# Patient Record
Sex: Female | Born: 1965 | Hispanic: Yes | Marital: Married | Smoking: Never smoker
Health system: Southern US, Community
[De-identification: ages and names within clinical notes are randomized; demographics above are authoritative.]

## PROBLEM LIST (undated history)

## (undated) ENCOUNTER — Ambulatory Visit: Admission: EM | Payer: Medicaid Other

## (undated) DIAGNOSIS — E119 Type 2 diabetes mellitus without complications: Secondary | ICD-10-CM

## (undated) DIAGNOSIS — I10 Essential (primary) hypertension: Secondary | ICD-10-CM

## (undated) DIAGNOSIS — M329 Systemic lupus erythematosus, unspecified: Secondary | ICD-10-CM

## (undated) DIAGNOSIS — IMO0002 Reserved for concepts with insufficient information to code with codable children: Secondary | ICD-10-CM

## (undated) HISTORY — PX: ABDOMINAL HYSTERECTOMY: SHX81

---

## 2011-12-25 ENCOUNTER — Ambulatory Visit: Payer: Self-pay | Admitting: Rheumatology

## 2012-03-28 ENCOUNTER — Ambulatory Visit: Payer: Self-pay | Admitting: General Practice

## 2012-04-12 DIAGNOSIS — M5126 Other intervertebral disc displacement, lumbar region: Secondary | ICD-10-CM | POA: Insufficient documentation

## 2012-04-19 ENCOUNTER — Ambulatory Visit: Payer: Self-pay | Admitting: Internal Medicine

## 2012-07-07 ENCOUNTER — Ambulatory Visit: Payer: Self-pay | Admitting: Internal Medicine

## 2012-08-17 ENCOUNTER — Ambulatory Visit: Payer: Self-pay | Admitting: Internal Medicine

## 2012-09-23 ENCOUNTER — Emergency Department: Payer: Self-pay | Admitting: Emergency Medicine

## 2013-02-21 ENCOUNTER — Emergency Department: Payer: Self-pay | Admitting: Emergency Medicine

## 2013-03-21 ENCOUNTER — Emergency Department: Payer: Self-pay | Admitting: Emergency Medicine

## 2013-05-30 ENCOUNTER — Ambulatory Visit: Payer: Self-pay | Admitting: Nurse Practitioner

## 2013-10-07 IMAGING — US US PELV - US TRANSVAGINAL
1 series · 14 of 25 positions shown · non-contrast
Comparison: none

REASON FOR EXAM: 6 to 8 week repeat to eval ovarian cysts
COMMENTS:

[Series 1: us pelv - us transvaginal · 0.28mm/px · 14 of 64 slices shown]
[im 1/64]
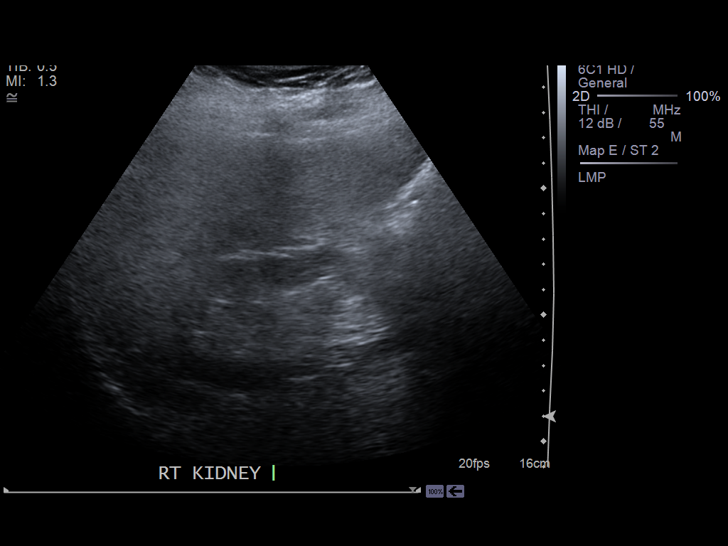
[im 6/64]
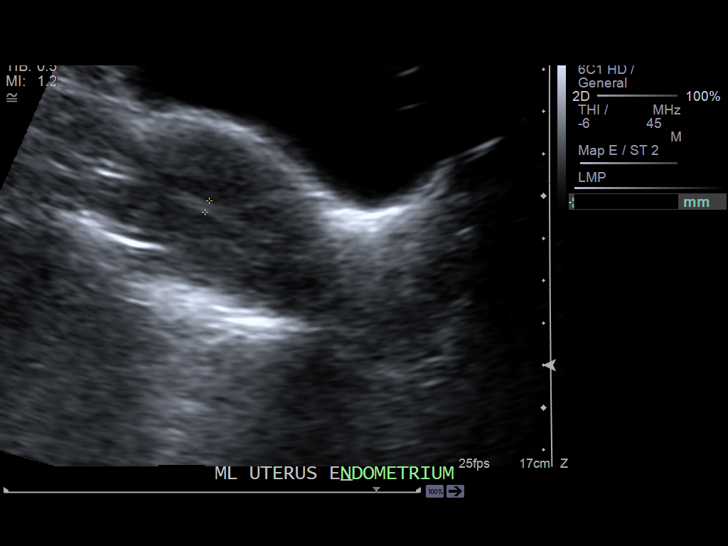
[im 11/64]
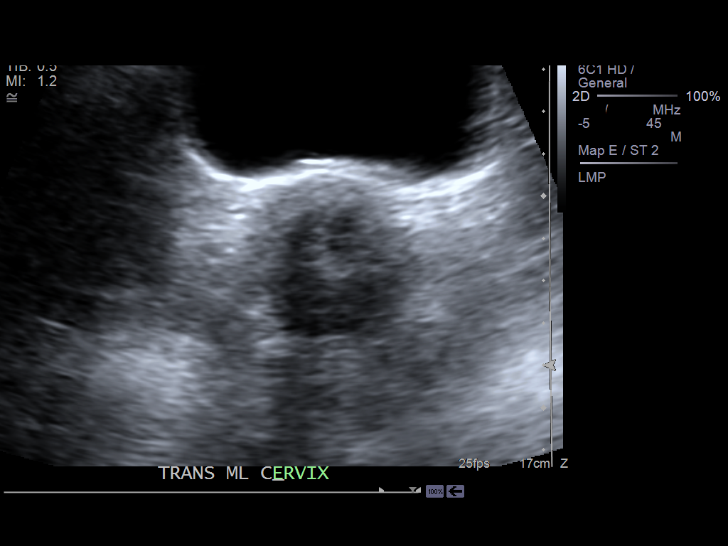
[im 16/64]
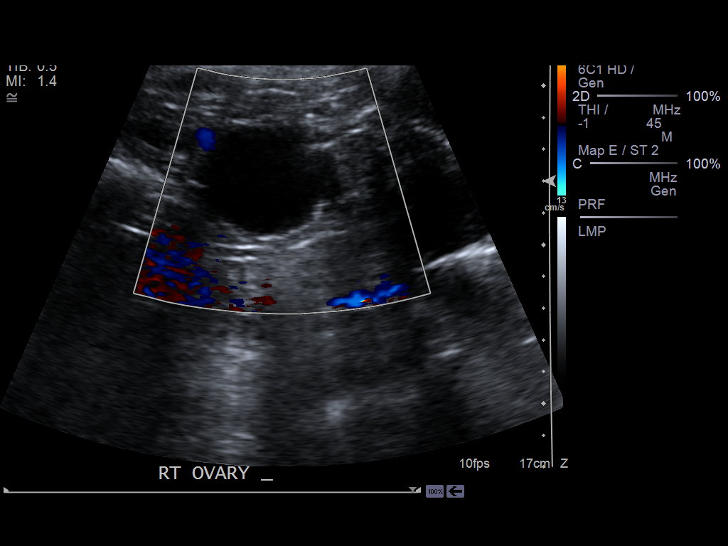
[im 22/64]
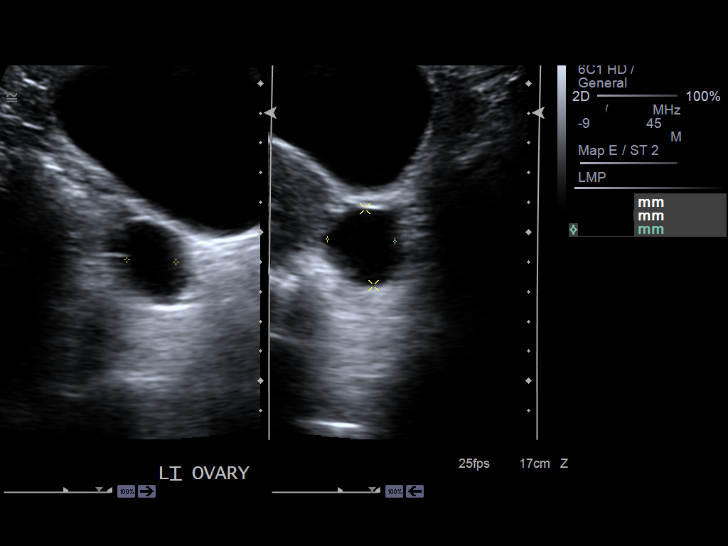
[im 24/64]
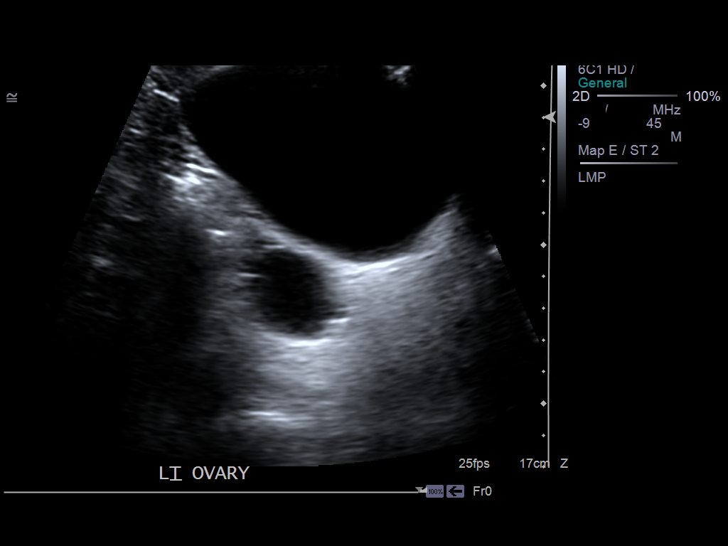
[im 29/64]
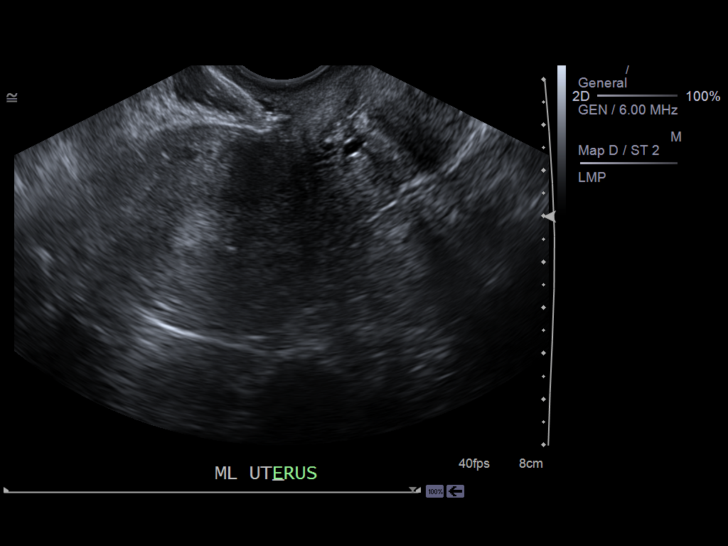
[im 35/64]
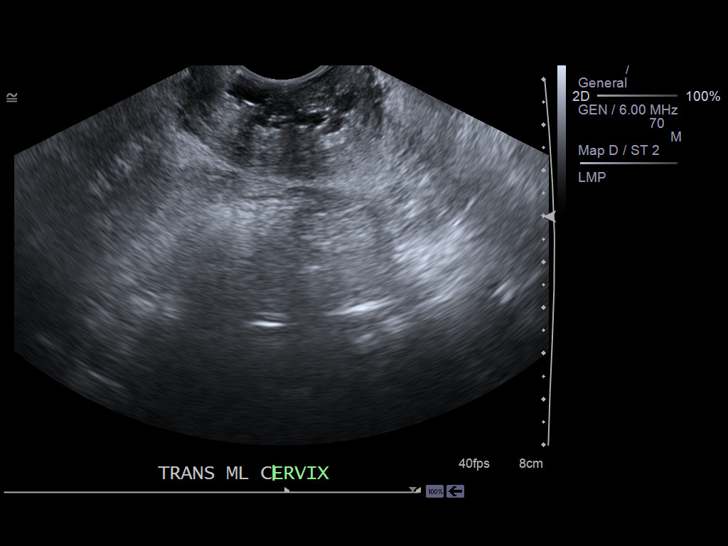
[im 40/64]
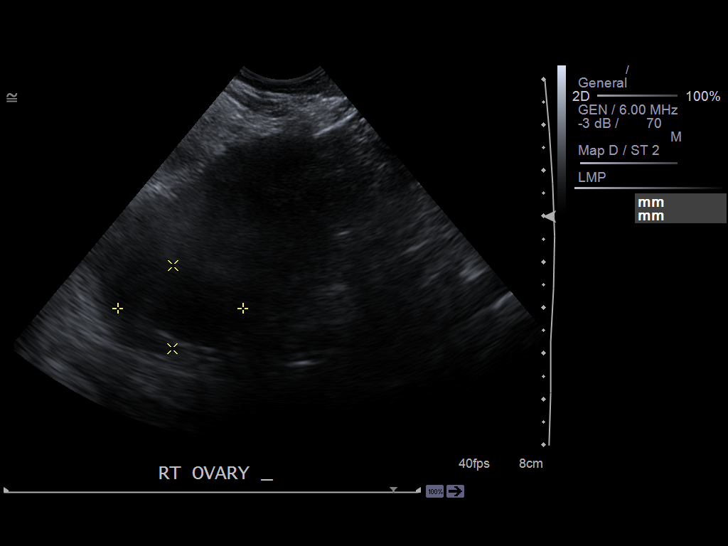
[im 43/64]
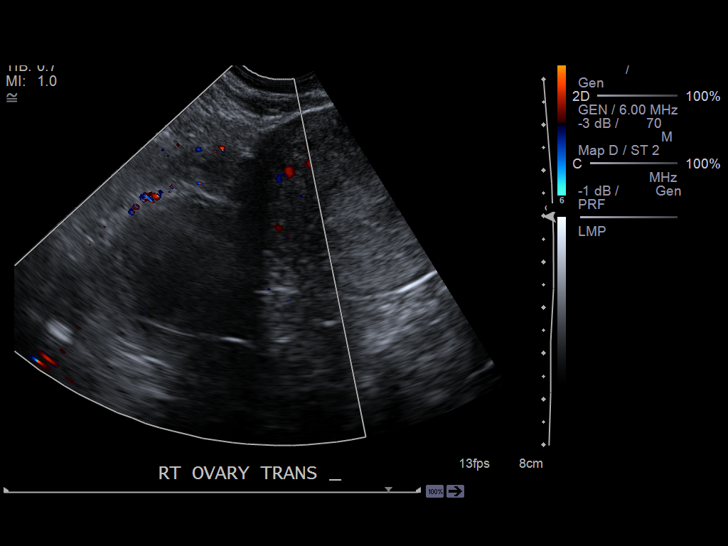
[im 48/64]
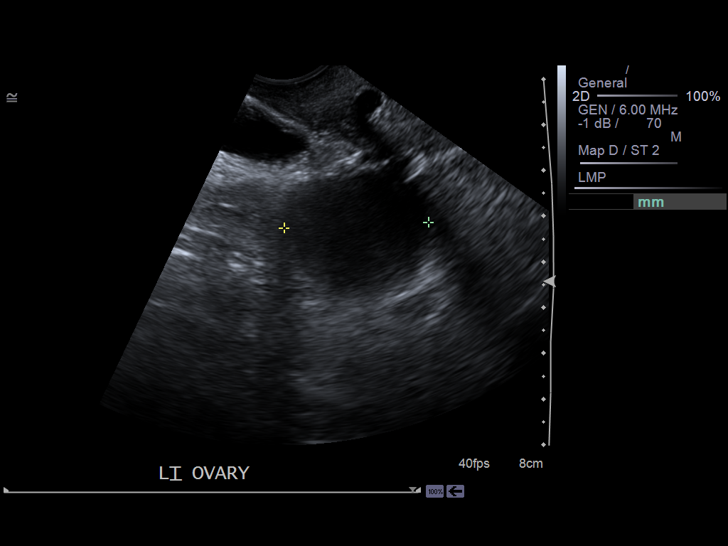
[im 53/64]
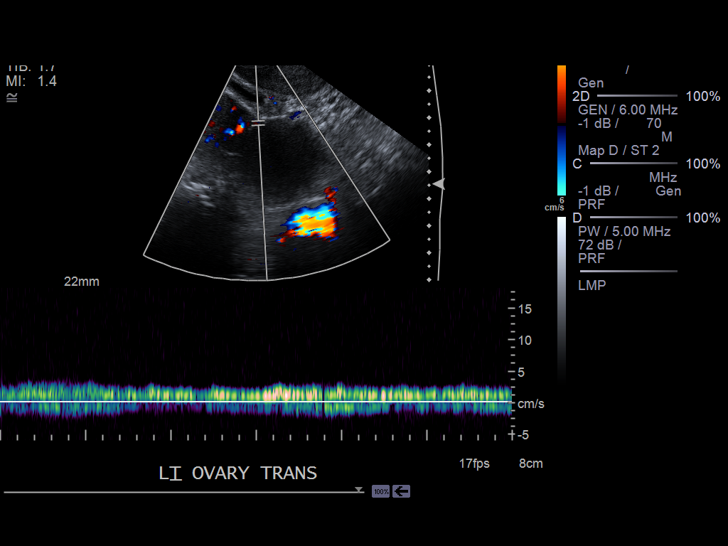
[im 58/64]
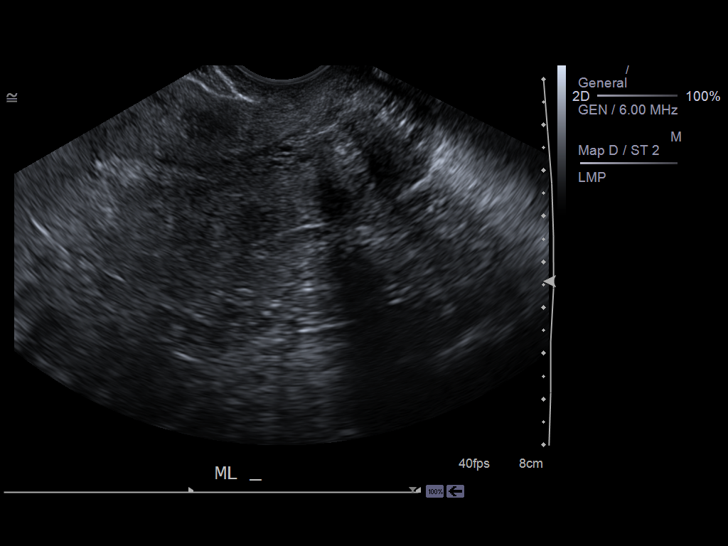
[im 64/64]
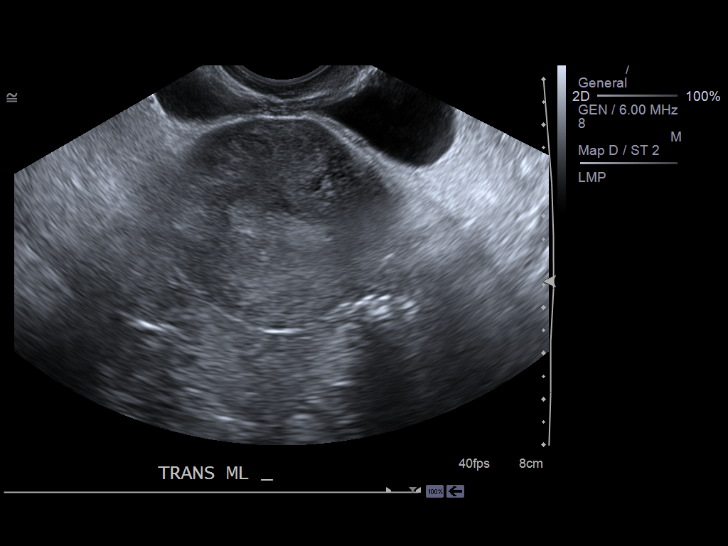

[14 of 25 positions shown; findings below may reference images not displayed]

PROCEDURE:     US  - US PELVIS EXAM W/TRANSVAGINAL  - August 17, 2012 [DATE]

RESULT:     Pelvic sonogram is performed. Transabdominal and endovaginal
imaging is performed. The kidneys appear grossly normal on survey images.
The uterus measures 8.98 x 5.24 x 3.62 cm. The right ovary measures 3.94 x
3.49 x 4.39 cm while the left is 2.97 x 2.83 x 2.99 cm. There are
well-circumscribed anechoic areas in each ovary. On the left this measures
2.93 x 2.51 x 2.33 cm. The right ovarian cystic collection measures 3.41 x
3.44 x 2.88 cm. There is no free fluid. Blood flow is documented in both
ovaries.
IMPRESSION: 1. Simple bilateral large ovarian cysts. Followup to document resolution in
6 to 8 weeks.

[REDACTED]

## 2014-01-09 ENCOUNTER — Ambulatory Visit: Payer: Self-pay | Admitting: Gastroenterology

## 2014-01-10 LAB — PATHOLOGY REPORT

## 2014-03-05 DIAGNOSIS — M329 Systemic lupus erythematosus, unspecified: Secondary | ICD-10-CM | POA: Insufficient documentation

## 2014-03-05 DIAGNOSIS — M549 Dorsalgia, unspecified: Secondary | ICD-10-CM | POA: Insufficient documentation

## 2014-03-08 ENCOUNTER — Ambulatory Visit: Payer: Self-pay | Admitting: Family Medicine

## 2014-04-16 ENCOUNTER — Ambulatory Visit: Payer: Self-pay | Admitting: Family Medicine

## 2014-04-24 ENCOUNTER — Ambulatory Visit: Payer: Self-pay | Admitting: Family Medicine

## 2016-08-04 ENCOUNTER — Other Ambulatory Visit: Payer: Self-pay | Admitting: Rheumatology

## 2016-08-04 DIAGNOSIS — M545 Low back pain, unspecified: Secondary | ICD-10-CM

## 2016-08-14 ENCOUNTER — Ambulatory Visit
Admission: RE | Admit: 2016-08-14 | Discharge: 2016-08-14 | Disposition: A | Discharge status: Home / Self Care | Payer: Medicaid Other | Source: Ambulatory Visit | Attending: Rheumatology | Admitting: Rheumatology

## 2016-08-14 ENCOUNTER — Encounter: Payer: Self-pay | Admitting: Radiology

## 2016-08-14 DIAGNOSIS — M48061 Spinal stenosis, lumbar region without neurogenic claudication: Secondary | ICD-10-CM | POA: Diagnosis not present

## 2016-08-14 DIAGNOSIS — M5137 Other intervertebral disc degeneration, lumbosacral region: Secondary | ICD-10-CM | POA: Diagnosis not present

## 2016-08-14 DIAGNOSIS — M5136 Other intervertebral disc degeneration, lumbar region: Secondary | ICD-10-CM | POA: Insufficient documentation

## 2016-08-14 DIAGNOSIS — M4316 Spondylolisthesis, lumbar region: Secondary | ICD-10-CM | POA: Insufficient documentation

## 2016-08-14 DIAGNOSIS — M545 Low back pain, unspecified: Secondary | ICD-10-CM

## 2017-02-13 ENCOUNTER — Emergency Department
Admission: EM | Admit: 2017-02-13 | Discharge: 2017-02-13 | Disposition: A | Discharge status: Home / Self Care | Payer: Medicaid Other | Attending: Emergency Medicine | Admitting: Emergency Medicine

## 2017-02-13 ENCOUNTER — Encounter: Payer: Self-pay | Admitting: *Deleted

## 2017-02-13 DIAGNOSIS — M5442 Lumbago with sciatica, left side: Secondary | ICD-10-CM | POA: Diagnosis not present

## 2017-02-13 DIAGNOSIS — M545 Low back pain: Secondary | ICD-10-CM | POA: Diagnosis present

## 2017-02-13 DIAGNOSIS — G8929 Other chronic pain: Secondary | ICD-10-CM

## 2017-02-13 MED ORDER — KETOROLAC TROMETHAMINE 60 MG/2ML IM SOLN
60.0000 mg | Freq: Once | INTRAMUSCULAR | Status: AC
  Administered 2017-02-13: 60 mg via INTRAMUSCULAR
  Filled 2017-02-13: qty 2

## 2017-02-13 MED ORDER — DICLOFENAC SODIUM 50 MG PO TBEC: 50.0000 mg | DELAYED_RELEASE_TABLET | Freq: Two times a day (BID) | ORAL | 1 refills | Status: DC

## 2017-02-13 MED ORDER — ORPHENADRINE CITRATE 30 MG/ML IJ SOLN
60.0000 mg | INTRAMUSCULAR | Status: AC
  Administered 2017-02-13: 60 mg via INTRAMUSCULAR
  Filled 2017-02-13: qty 2

## 2017-02-13 MED ORDER — CYCLOBENZAPRINE HCL 5 MG PO TABS: 5.0000 mg | ORAL_TABLET | Freq: Three times a day (TID) | ORAL | 0 refills | Status: DC | PRN

## 2017-02-13 NOTE — Discharge Instructions (Signed)
You are being treated for acute pain from your herniated disc and spinal stenosis. Keep your appointment with Dr. Yves Dill as scheduled. Take the prescription meds as directed. Return to the ED as needed.  Est siendo tratado por dolor agudo causado por su disco herniado y estenosis espinal. Mantenga su cita con el Dr. Yves Dill segn lo programado. Tome los medicamentos recetados segn las indicaciones. Regresa al ED segn sea necesario

## 2017-02-13 NOTE — ED Triage Notes (Signed)
Pt to ED reporting lower back pain x 3 days. Pt able to ambulate. No swelling in legs. No flank pain or changes in urine reported.

## 2017-02-13 NOTE — ED Provider Notes (Signed)
[facility] Emergency Department Provider Note ____________________________________________  Time seen: 1243  I have reviewed the triage vital signs and the nursing notes.  HISTORY  Chief Complaint  Back Pain  History limited by Spanish language. Interpreter Ciro Backer) present during interview and exam.  HPI Lindsay Dean is a 51 y.o. female presents to the ED for 3 day complaint of low back pain. Patient denies any flank pain, bowel or bladder changes, or saddle paresthesias. She has a history of chronic low back pain with confirmed L5-S1 spondylolisthesis and HNP, with spinal stenosis. She is in the care of Dr. Yves Dill, who recently performed 2 epidural steroid injections. He denies any recent injury, accident, trauma. She is scheduled to see Dr. Yves Dill for follow-up in 4 days.  History reviewed. No pertinent past medical history.  There are no active problems to display for this patient.  History reviewed. No pertinent surgical history.  Prior to Admission medications   Medication Sig Start Date End Date Taking? Authorizing Provider  cyclobenzaprine (FLEXERIL) 5 MG tablet Take 1 tablet (5 mg total) by mouth 3 (three) times daily as needed for muscle spasms. 02/13/17   Ellayna Hilligoss, Charlesetta Ivory, PA-C  diclofenac (VOLTAREN) 50 MG EC tablet Take 1 tablet (50 mg total) by mouth 2 (two) times daily. 02/13/17   Esiah Bazinet, Charlesetta Ivory, PA-C   Allergies Patient has no allergy information on record.  No family history on file.  Social History Social History  Substance Use Topics  . Smoking status: Never Smoker  . Smokeless tobacco: Never Used  . Alcohol use No    Review of Systems  Constitutional: Negative for fever. Cardiovascular: Negative for chest pain. Respiratory: Negative for shortness of breath. Gastrointestinal: Negative for abdominal pain, vomiting and diarrhea. Genitourinary: Negative for dysuria. Musculoskeletal: Positive for back  pain. Skin: Negative for rash. Neurological: Negative for headaches, focal weakness or numbness. ____________________________________________  PHYSICAL EXAM:  VITAL SIGNS: ED Triage Vitals [02/13/17 1130]  Enc Vitals Group     BP (!) 148/92     Pulse Rate 67     Resp 16     Temp 98 F (36.7 C)     Temp src      SpO2 (!) 10 %     Weight      Height      Head Circumference      Peak Flow      Pain Score      Pain Loc      Pain Edu?      Excl. in GC?     Constitutional: Alert and oriented. Well appearing and in no distress. Head: Normocephalic and atraumatic. Neck: Supple. No thyromegaly. Cardiovascular: Normal rate, regular rhythm. Normal distal pulses. Respiratory: Normal respiratory effort. No wheezes/rales/rhonchi. Gastrointestinal: Soft and nontender. No distention. Musculoskeletal: Normal spinal alignment without midline tenderness, spasm, deformity, or step-off. Negative supine SLR bilaterally. Nontender with normal range of motion in all extremities.  Neurologic: CN 2 through 12 grossly intact. Normal LE DTRs bilaterally. Normal gait without ataxia. Normal speech and language. No gross focal neurologic deficits are appreciated. Skin:  Skin is warm, dry and intact. No rash noted. ____________________________________________   RADIOLOGY  Lumbar MRI (07/2016)  IMPRESSION: 1. 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 Questionable superimposed right L4 more recent pars fracture. Grade 1 anterolisthesis of L4 on L5 has developed since 2013. Follow-up noncontrast Lumbar Spine CT (could be limited to  only the L3 through S1 levels) would be complementary to fully characterize these bony defects and assist in treatment strategy. 2. Associated disc and severe posterior element degeneration at L4-L5. No significant spinal stenosis but there is mild lateral recess stenosis and moderate to severe L4  foraminal stenosis worse on the right. 3. Moderate facet degeneration with mild degenerative appearing marrow edema also in the L5 posterior elements. Chronic and advanced L5-S1 disc and endplate degeneration. Leftward disc herniation has regressed but not resolved since 2013. Moderate left lateral recess and bilateral neural foraminal stenosis. ____________________________________________  PROCEDURES  Toradol 60 mg IM Norflex 60 mg IM ____________________________________________  INITIAL IMPRESSION / ASSESSMENT AND PLAN / ED COURSE  Patient with chronic back pain with underlying HNP, spondylolisthesis, and spinal stenosis. She has right lower extremity radicular symptoms on presentation. Her pain is not above baseline at this time. She'll be discharged with a prescription for Voltaren and Flexeril doses directed. She will follow with Dr. Yves Dill as scheduled on 8/29 for further evaluation and management. ____________________________________________  FINAL CLINICAL IMPRESSION(S) / ED DIAGNOSES  Final diagnoses:  Chronic bilateral low back pain with left-sided sciatica      Lissa Hoard, PA-C 02/13/17 1420    Myrna Blazer, MD 02/13/17 [phone]

## 2017-02-13 NOTE — ED Notes (Signed)
Per interpreter, Discussed discharge instructions, prescriptions, and follow-up care with patient. No questions or concerns at this time. Pt stable at discharge.

## 2018-04-04 ENCOUNTER — Other Ambulatory Visit: Payer: Self-pay

## 2018-04-04 DIAGNOSIS — Z1211 Encounter for screening for malignant neoplasm of colon: Secondary | ICD-10-CM

## 2018-05-13 ENCOUNTER — Emergency Department
Admission: EM | Admit: 2018-05-13 | Discharge: 2018-05-13 | Disposition: A | Discharge status: Home / Self Care | Payer: Medicaid Other | Attending: Student in an Organized Health Care Education/Training Program | Admitting: Student in an Organized Health Care Education/Training Program

## 2018-05-13 ENCOUNTER — Other Ambulatory Visit: Payer: Self-pay

## 2018-05-13 ENCOUNTER — Emergency Department: Payer: Medicaid Other

## 2018-05-13 ENCOUNTER — Encounter: Payer: Self-pay | Admitting: Emergency Medicine

## 2018-05-13 DIAGNOSIS — I10 Essential (primary) hypertension: Secondary | ICD-10-CM | POA: Insufficient documentation

## 2018-05-13 DIAGNOSIS — Z79899 Other long term (current) drug therapy: Secondary | ICD-10-CM | POA: Diagnosis not present

## 2018-05-13 DIAGNOSIS — R1013 Epigastric pain: Secondary | ICD-10-CM | POA: Insufficient documentation

## 2018-05-13 DIAGNOSIS — E119 Type 2 diabetes mellitus without complications: Secondary | ICD-10-CM | POA: Insufficient documentation

## 2018-05-13 HISTORY — DX: Systemic lupus erythematosus, unspecified: M32.9

## 2018-05-13 HISTORY — DX: Type 2 diabetes mellitus without complications: E11.9

## 2018-05-13 HISTORY — DX: Essential (primary) hypertension: I10

## 2018-05-13 HISTORY — DX: Reserved for concepts with insufficient information to code with codable children: IMO0002

## 2018-05-13 LAB — URINALYSIS, COMPLETE (UACMP) WITH MICROSCOPIC
Bilirubin Urine: NEGATIVE
GLUCOSE, UA: NEGATIVE mg/dL
Hgb urine dipstick: NEGATIVE
KETONES UR: NEGATIVE mg/dL
Leukocytes, UA: NEGATIVE
NITRITE: NEGATIVE
PH: 5 (ref 5.0–8.0)
PROTEIN: NEGATIVE mg/dL
SPECIFIC GRAVITY, URINE: 1.018 (ref 1.005–1.030)
SQUAMOUS EPITHELIAL / LPF: NONE SEEN (ref 0–5)

## 2018-05-13 LAB — COMPREHENSIVE METABOLIC PANEL
ALK PHOS: 65 U/L (ref 38–126)
ALT: 15 U/L (ref 0–44)
AST: 24 U/L (ref 15–41)
Albumin: 3.7 g/dL (ref 3.5–5.0)
Anion gap: 8 (ref 5–15)
BUN: 14 mg/dL (ref 6–20)
CHLORIDE: 105 mmol/L (ref 98–111)
CO2: 25 mmol/L (ref 22–32)
CREATININE: 0.82 mg/dL (ref 0.44–1.00)
Calcium: 8.9 mg/dL (ref 8.9–10.3)
Glucose, Bld: 151 mg/dL — ABNORMAL HIGH (ref 70–99)
Potassium: 3.5 mmol/L (ref 3.5–5.1)
Sodium: 138 mmol/L (ref 135–145)
Total Bilirubin: 0.7 mg/dL (ref 0.3–1.2)
Total Protein: 8.2 g/dL — ABNORMAL HIGH (ref 6.5–8.1)

## 2018-05-13 LAB — CBC
HEMATOCRIT: 36.5 % (ref 36.0–46.0)
Hemoglobin: 12.3 g/dL (ref 12.0–15.0)
MCH: 30.9 pg (ref 26.0–34.0)
MCHC: 33.7 g/dL (ref 30.0–36.0)
MCV: 91.7 fL (ref 80.0–100.0)
Platelets: 184 10*3/uL (ref 150–400)
RBC: 3.98 MIL/uL (ref 3.87–5.11)
RDW: 12.5 % (ref 11.5–15.5)
WBC: 5.6 10*3/uL (ref 4.0–10.5)
nRBC: 0 % (ref 0.0–0.2)

## 2018-05-13 LAB — LIPASE, BLOOD: LIPASE: 34 U/L (ref 11–51)

## 2018-05-13 LAB — TROPONIN I: Troponin I: <0.03 ng/mL (ref <0.03)

## 2018-05-13 MED ORDER — PROMETHAZINE HCL 12.5 MG PO TABS: 12.5000 mg | ORAL_TABLET | Freq: Four times a day (QID) | ORAL | 0 refills | Status: DC | PRN

## 2018-05-13 MED ORDER — SODIUM CHLORIDE 0.9 % IV BOLUS
500.0000 mL | Freq: Once | INTRAVENOUS | Status: AC
  Administered 2018-05-13: 500 mL via INTRAVENOUS

## 2018-05-13 MED ORDER — IOPAMIDOL (ISOVUE-300) INJECTION 61%
100.0000 mL | Freq: Once | INTRAVENOUS | Status: AC | PRN
  Administered 2018-05-13: 100 mL via INTRAVENOUS

## 2018-05-13 MED ORDER — PROMETHAZINE HCL 25 MG/ML IJ SOLN
12.5000 mg | Freq: Four times a day (QID) | INTRAMUSCULAR | Status: DC | PRN
  Administered 2018-05-13: 12.5 mg via INTRAVENOUS
  Filled 2018-05-13: qty 1

## 2018-05-13 MED ORDER — MORPHINE SULFATE (PF) 4 MG/ML IV SOLN
4.0000 mg | INTRAVENOUS | Status: DC | PRN
  Administered 2018-05-13: 4 mg via INTRAVENOUS
  Filled 2018-05-13: qty 1

## 2018-05-13 MED ORDER — SUCRALFATE 1 G PO TABS
1.0000 g | ORAL_TABLET | Freq: Once | ORAL | Status: AC
  Administered 2018-05-13: 1 g via ORAL
  Filled 2018-05-13: qty 1

## 2018-05-13 NOTE — ED Provider Notes (Signed)
[facility] Emergency Department Provider Note    First MD Initiated Contact with Patient 05/13/18 [phone]     (approximate)  I have reviewed the triage vital signs and the nursing notes.   HISTORY  Chief Complaint Abdominal Pain; Emesis; and Dysuria    HPI Lindsay Dean is a 52 y.o. female history of lupus as well as hypertension and diabetes presents the ER with generalized epigastric pain associated with nausea vomiting.  Pain does radiate through to her back.  Denies any fevers.  She is status post cholecystectomy.  Denies any chest pain or shortness of breath.  States the pain is mild to moderate in severity.    Past Medical History:  Diagnosis Date  . Diabetes mellitus without complication (HCC)   . Hypertension   . Lupus (HCC)    No family history on file. Past Surgical History:  Procedure Laterality Date  . ABDOMINAL HYSTERECTOMY     There are no active problems to display for this patient.     Prior to Admission medications   Medication Sig Start Date End Date Taking? Authorizing Provider  cyclobenzaprine (FLEXERIL) 5 MG tablet Take 1 tablet (5 mg total) by mouth 3 (three) times daily as needed for muscle spasms. 02/13/17   Menshew, Charlesetta Ivory, PA-C  diclofenac (VOLTAREN) 50 MG EC tablet Take 1 tablet (50 mg total) by mouth 2 (two) times daily. 02/13/17   Menshew, Charlesetta Ivory, PA-C  promethazine (PHENERGAN) 12.5 MG tablet Take 1 tablet (12.5 mg total) by mouth every 6 (six) hours as needed for nausea or vomiting. 05/13/18   Willy Eddy, MD    Allergies Patient has no known allergies.    Social History Social History   Tobacco Use  . Smoking status: Never Smoker  . Smokeless tobacco: Never Used  Substance Use Topics  . Alcohol use: No  . Drug use: No    Review of Systems Patient denies headaches, rhinorrhea, blurry vision, numbness, shortness of breath, chest pain, edema, cough, abdominal pain, nausea,  vomiting, diarrhea, dysuria, fevers, rashes or hallucinations unless otherwise stated above in HPI. ____________________________________________   PHYSICAL EXAM:  VITAL SIGNS: Vitals:   05/13/18 0741 05/13/18 0837  BP: (!) 156/93 (!) 143/92  Pulse: 73 64  Resp: 14 16  Temp:    SpO2: 94% 94%    Constitutional: Alert and oriented.  Eyes: Conjunctivae are normal.  Head: Atraumatic. Nose: No congestion/rhinnorhea. Mouth/Throat: Mucous membranes are moist.   Neck: No stridor. Painless ROM.  Cardiovascular: Normal rate, regular rhythm. Grossly normal heart sounds.  Good peripheral circulation. Respiratory: Normal respiratory effort.  No retractions. Lungs CTAB. Gastrointestinal: Soft with mild epigastric ttp. No distention. No abdominal bruits. No CVA tenderness. Genitourinary:  Musculoskeletal: No lower extremity tenderness nor edema.  No joint effusions. Neurologic:  Normal speech and language. No gross focal neurologic deficits are appreciated. No facial droop Skin:  Skin is warm, dry and intact. No rash noted. Psychiatric: Mood and affect are normal. Speech and behavior are normal.  ____________________________________________   LABS (all labs ordered are listed, but only abnormal results are displayed)  Results for orders placed or performed during the hospital encounter of 05/13/18 (from the past 24 hour(s))  Lipase, blood     Status: None   Collection Time: 05/13/18  5:28 AM  Result Value Ref Range   Lipase 34 11 - 51 U/L  Comprehensive metabolic panel     Status: Abnormal   Collection Time: 05/13/18  5:28 AM  Result Value Ref Range   Sodium 138 135 - 145 mmol/L   Potassium 3.5 3.5 - 5.1 mmol/L   Chloride 105 98 - 111 mmol/L   CO2 25 22 - 32 mmol/L   Glucose, Bld 151 (H) 70 - 99 mg/dL   BUN 14 6 - 20 mg/dL   Creatinine, Ser 1.610.82 0.44 - 1.00 mg/dL   Calcium 8.9 8.9 - 09.610.3 mg/dL   Total Protein 8.2 (H) 6.5 - 8.1 g/dL   Albumin 3.7 3.5 - 5.0 g/dL   AST 24 15 - 41  U/L   ALT 15 0 - 44 U/L   Alkaline Phosphatase 65 38 - 126 U/L   Total Bilirubin 0.7 0.3 - 1.2 mg/dL   GFR calc non Af Amer >60 >60 mL/min   GFR calc Af Amer >60 >60 mL/min   Anion gap 8 5 - 15  CBC     Status: None   Collection Time: 05/13/18  5:28 AM  Result Value Ref Range   WBC 5.6 4.0 - 10.5 K/uL   RBC 3.98 3.87 - 5.11 MIL/uL   Hemoglobin 12.3 12.0 - 15.0 g/dL   HCT 04.536.5 40.936.0 - 81.146.0 %   MCV 91.7 80.0 - 100.0 fL   MCH 30.9 26.0 - 34.0 pg   MCHC 33.7 30.0 - 36.0 g/dL   RDW 91.412.5 78.211.5 - 95.615.5 %   Platelets 184 150 - 400 K/uL   nRBC 0.0 0.0 - 0.2 %  Troponin I - Add-On to previous collection     Status: None   Collection Time: 05/13/18  5:28 AM  Result Value Ref Range   Troponin I <0.03 <0.03 ng/mL  Urinalysis, Complete w Microscopic     Status: Abnormal   Collection Time: 05/13/18  5:36 AM  Result Value Ref Range   Color, Urine YELLOW (A) YELLOW   APPearance CLEAR (A) CLEAR   Specific Gravity, Urine 1.018 1.005 - 1.030   pH 5.0 5.0 - 8.0   Glucose, UA NEGATIVE NEGATIVE mg/dL   Hgb urine dipstick NEGATIVE NEGATIVE   Bilirubin Urine NEGATIVE NEGATIVE   Ketones, ur NEGATIVE NEGATIVE mg/dL   Protein, ur NEGATIVE NEGATIVE mg/dL   Nitrite NEGATIVE NEGATIVE   Leukocytes, UA NEGATIVE NEGATIVE   RBC / HPF 0-5 0 - 5 RBC/hpf   WBC, UA 0-5 0 - 5 WBC/hpf   Bacteria, UA RARE (A) NONE SEEN   Squamous Epithelial / LPF NONE SEEN 0 - 5   Mucus PRESENT   Urinalysis, Complete w Microscopic     Status: Abnormal   Collection Time: 05/13/18  6:12 AM  Result Value Ref Range   Color, Urine YELLOW (A) YELLOW   APPearance CLEAR (A) CLEAR   Specific Gravity, Urine 1.018 1.005 - 1.030   pH 6.0 5.0 - 8.0   Glucose, UA NEGATIVE NEGATIVE mg/dL   Hgb urine dipstick NEGATIVE NEGATIVE   Bilirubin Urine NEGATIVE NEGATIVE   Ketones, ur NEGATIVE NEGATIVE mg/dL   Protein, ur 30 (A) NEGATIVE mg/dL   Nitrite NEGATIVE NEGATIVE   Leukocytes, UA NEGATIVE NEGATIVE   RBC / HPF 0-5 0 - 5 RBC/hpf   WBC, UA  0-5 0 - 5 WBC/hpf   Bacteria, UA NONE SEEN NONE SEEN   Squamous Epithelial / LPF 0-5 0 - 5   Mucus PRESENT    ____________________________________________  EKG My review and personal interpretation at Time: 9:54   Indication: epigastric pain  Rate: 60  Rhythm: sinus Axis: normal Other: borderline  prolonged qt, no stemi, nonspecific t wave abn ____________________________________________  RADIOLOGY  I personally reviewed all radiographic images ordered to evaluate for the above acute complaints and reviewed radiology reports and findings.  These findings were personally discussed with the patient.  Please see medical record for radiology report.  ____________________________________________   PROCEDURES  Procedure(s) performed:  Procedures    Critical Care performed: no ____________________________________________   INITIAL IMPRESSION / ASSESSMENT AND PLAN / ED COURSE  Pertinent labs & imaging results that were available during my care of the patient were reviewed by me and considered in my medical decision making (see chart for details).   DDX: pancreatitis, enteritis, gastritis, mass, sbo  Lindsay Dean is a 52 y.o. who presents to the ED with some Tums as described above.  Patient nontoxic-appearing but does appear uncomfortable.  Blood work thereafter thus far is reassuring.  No evidence of UTI.  Based on her age previous surgical history and symptoms will order CT imaging to evaluate for acute intra-abdominal process.  Clinical Course as of May 13 1005  Fri May 13, 2018  1005 CT imaging shows no acute intra-abdominal process.  No evidence of obstructive pattern.  EKG shows no evidence of ischemia.  Troponin negative.  Patient tolerating oral hydration.  Most clinically consistent with acute gastritis.  Patient stable for trial of outpatient observation.  Have discussed with the patient and available family all diagnostics and treatments performed thus far and all  questions were answered to the best of my ability. The patient demonstrates understanding and agreement with plan.    [PR]    Clinical Course User Index [PR] Willy Eddy, MD     As part of my medical decision making, I reviewed the following data within the electronic MEDICAL RECORD NUMBER Nursing notes reviewed and incorporated, Labs reviewed, notes from prior ED visits.   ____________________________________________   FINAL CLINICAL IMPRESSION(S) / ED DIAGNOSES  Final diagnoses:  Acute epigastric pain      NEW MEDICATIONS STARTED DURING THIS VISIT:  New Prescriptions   PROMETHAZINE (PHENERGAN) 12.5 MG TABLET    Take 1 tablet (12.5 mg total) by mouth every 6 (six) hours as needed for nausea or vomiting.     Note:  This document was prepared using Dragon voice recognition software and may include unintentional dictation errors.    Willy Eddy, MD 05/13/18 1006

## 2018-05-13 NOTE — ED Triage Notes (Signed)
Patient with complaint of generalized abdominal pain, constipation, vomiting and pain with urination times 2 days.

## 2018-05-23 ENCOUNTER — Ambulatory Visit: Payer: Medicaid Other | Admitting: Anesthesiology

## 2018-05-23 ENCOUNTER — Encounter
Admission: RE | Disposition: A | Discharge status: Home / Self Care | Payer: Self-pay | Source: Ambulatory Visit | Attending: Gastroenterology

## 2018-05-23 ENCOUNTER — Encounter: Payer: Self-pay | Admitting: *Deleted

## 2018-05-23 ENCOUNTER — Ambulatory Visit
Admission: RE | Admit: 2018-05-23 | Discharge: 2018-05-23 | Disposition: A | Discharge status: Home / Self Care | Payer: Medicaid Other | Source: Ambulatory Visit | Attending: Gastroenterology | Admitting: Gastroenterology

## 2018-05-23 DIAGNOSIS — Z5309 Procedure and treatment not carried out because of other contraindication: Secondary | ICD-10-CM | POA: Insufficient documentation

## 2018-05-23 DIAGNOSIS — I10 Essential (primary) hypertension: Secondary | ICD-10-CM | POA: Diagnosis not present

## 2018-05-23 DIAGNOSIS — Z1211 Encounter for screening for malignant neoplasm of colon: Secondary | ICD-10-CM

## 2018-05-23 DIAGNOSIS — M329 Systemic lupus erythematosus, unspecified: Secondary | ICD-10-CM | POA: Diagnosis not present

## 2018-05-23 DIAGNOSIS — E119 Type 2 diabetes mellitus without complications: Secondary | ICD-10-CM | POA: Diagnosis not present

## 2018-05-23 SURGERY — COLONOSCOPY WITH PROPOFOL
Anesthesia: General

## 2018-05-23 MED ORDER — SODIUM CHLORIDE 0.9 % IV SOLN: INTRAVENOUS | Status: DC

## 2018-05-23 MED ORDER — PROPOFOL 10 MG/ML IV BOLUS
INTRAVENOUS | Status: AC
  Filled 2018-05-23: qty 20

## 2018-05-23 NOTE — Anesthesia Preprocedure Evaluation (Signed)
Anesthesia Evaluation  Patient identified by MRN, date of birth, ID band Patient awake    Reviewed: Allergy & Precautions, H&P , NPO status , Patient's Chart, lab work & pertinent test results  Airway        Dental   Pulmonary neg pulmonary ROS,           Cardiovascular hypertension,      Neuro/Psych negative neurological ROS  negative psych ROS   GI/Hepatic negative GI ROS, Neg liver ROS,   Endo/Other  diabetes  Renal/GU negative Renal ROS  negative genitourinary   Musculoskeletal   Abdominal   Peds  Hematology negative hematology ROS (+)   Anesthesia Other Findings Past Medical History: No date: Diabetes mellitus without complication (HCC)     Comment:  taken off meds in 2017 No date: Hypertension No date: Lupus (HCC)  Past Surgical History: No date: ABDOMINAL HYSTERECTOMY  BMI    Body Mass Index:  31.38 kg/m      Reproductive/Obstetrics negative OB ROS                             Anesthesia Physical Anesthesia Plan  ASA: II  Anesthesia Plan: General   Post-op Pain Management:    Induction:   PONV Risk Score and Plan: Propofol infusion and TIVA  Airway Management Planned: Natural Airway and Nasal Cannula  Additional Equipment:   Intra-op Plan:   Post-operative Plan:   Informed Consent: I have reviewed the patients History and Physical, chart, labs and discussed the procedure including the risks, benefits and alternatives for the proposed anesthesia with the patient or authorized representative who has indicated his/her understanding and acceptance.   Dental Advisory Given  Plan Discussed with: Anesthesiologist  Anesthesia Plan Comments:         Anesthesia Quick Evaluation

## 2018-05-23 NOTE — Progress Notes (Signed)
Cancelled due to patient ate two meals on the day before the procedure

## 2018-05-27 DIAGNOSIS — N812 Incomplete uterovaginal prolapse: Secondary | ICD-10-CM | POA: Insufficient documentation

## 2018-05-27 DIAGNOSIS — N816 Rectocele: Secondary | ICD-10-CM | POA: Insufficient documentation

## 2018-05-27 DIAGNOSIS — M4310 Spondylolisthesis, site unspecified: Secondary | ICD-10-CM | POA: Insufficient documentation

## 2018-05-27 DIAGNOSIS — N8110 Cystocele, unspecified: Secondary | ICD-10-CM | POA: Insufficient documentation

## 2018-05-27 DIAGNOSIS — N393 Stress incontinence (female) (male): Secondary | ICD-10-CM | POA: Insufficient documentation

## 2018-05-27 DIAGNOSIS — G4730 Sleep apnea, unspecified: Secondary | ICD-10-CM | POA: Insufficient documentation

## 2018-12-30 ENCOUNTER — Other Ambulatory Visit: Payer: Self-pay | Admitting: Family Medicine

## 2018-12-30 DIAGNOSIS — Z1231 Encounter for screening mammogram for malignant neoplasm of breast: Secondary | ICD-10-CM

## 2019-05-04 ENCOUNTER — Inpatient Hospital Stay: Payer: Medicaid Other

## 2019-05-04 ENCOUNTER — Encounter: Payer: Self-pay | Admitting: Emergency Medicine

## 2019-05-04 ENCOUNTER — Other Ambulatory Visit: Payer: Self-pay

## 2019-05-04 ENCOUNTER — Inpatient Hospital Stay: Payer: Medicaid Other | Admitting: Anesthesiology

## 2019-05-04 ENCOUNTER — Inpatient Hospital Stay
Admission: EM | Admit: 2019-05-04 | Discharge: 2019-05-09 | DRG: 872 | Disposition: A | Discharge status: Home Health | Payer: Medicaid Other | Attending: Internal Medicine | Admitting: Internal Medicine

## 2019-05-04 ENCOUNTER — Emergency Department: Payer: Medicaid Other

## 2019-05-04 ENCOUNTER — Encounter
Admission: EM | Disposition: A | Discharge status: Home Health | Payer: Self-pay | Source: Home / Self Care | Attending: Internal Medicine

## 2019-05-04 DIAGNOSIS — I10 Essential (primary) hypertension: Secondary | ICD-10-CM | POA: Diagnosis present

## 2019-05-04 DIAGNOSIS — N136 Pyonephrosis: Secondary | ICD-10-CM | POA: Diagnosis present

## 2019-05-04 DIAGNOSIS — Z90710 Acquired absence of both cervix and uterus: Secondary | ICD-10-CM

## 2019-05-04 DIAGNOSIS — Z20828 Contact with and (suspected) exposure to other viral communicable diseases: Secondary | ICD-10-CM | POA: Diagnosis present

## 2019-05-04 DIAGNOSIS — A419 Sepsis, unspecified organism: Secondary | ICD-10-CM

## 2019-05-04 DIAGNOSIS — A4151 Sepsis due to Escherichia coli [E. coli]: Secondary | ICD-10-CM | POA: Diagnosis present

## 2019-05-04 DIAGNOSIS — N139 Obstructive and reflux uropathy, unspecified: Secondary | ICD-10-CM

## 2019-05-04 DIAGNOSIS — Z791 Long term (current) use of non-steroidal anti-inflammatories (NSAID): Secondary | ICD-10-CM | POA: Diagnosis not present

## 2019-05-04 DIAGNOSIS — Z9049 Acquired absence of other specified parts of digestive tract: Secondary | ICD-10-CM

## 2019-05-04 DIAGNOSIS — M329 Systemic lupus erythematosus, unspecified: Secondary | ICD-10-CM | POA: Diagnosis present

## 2019-05-04 DIAGNOSIS — N1330 Unspecified hydronephrosis: Secondary | ICD-10-CM

## 2019-05-04 DIAGNOSIS — Z1611 Resistance to penicillins: Secondary | ICD-10-CM | POA: Diagnosis present

## 2019-05-04 DIAGNOSIS — E860 Dehydration: Secondary | ICD-10-CM | POA: Diagnosis present

## 2019-05-04 DIAGNOSIS — N1339 Other hydronephrosis: Secondary | ICD-10-CM

## 2019-05-04 DIAGNOSIS — D649 Anemia, unspecified: Secondary | ICD-10-CM | POA: Diagnosis present

## 2019-05-04 DIAGNOSIS — N3281 Overactive bladder: Secondary | ICD-10-CM | POA: Diagnosis present

## 2019-05-04 DIAGNOSIS — E119 Type 2 diabetes mellitus without complications: Secondary | ICD-10-CM | POA: Diagnosis present

## 2019-05-04 DIAGNOSIS — E785 Hyperlipidemia, unspecified: Secondary | ICD-10-CM | POA: Diagnosis present

## 2019-05-04 DIAGNOSIS — N179 Acute kidney failure, unspecified: Secondary | ICD-10-CM | POA: Diagnosis present

## 2019-05-04 DIAGNOSIS — R509 Fever, unspecified: Secondary | ICD-10-CM

## 2019-05-04 DIAGNOSIS — N390 Urinary tract infection, site not specified: Secondary | ICD-10-CM

## 2019-05-04 DIAGNOSIS — E872 Acidosis: Secondary | ICD-10-CM | POA: Diagnosis present

## 2019-05-04 DIAGNOSIS — Z79899 Other long term (current) drug therapy: Secondary | ICD-10-CM | POA: Diagnosis not present

## 2019-05-04 HISTORY — PX: CYSTOSCOPY WITH STENT PLACEMENT: SHX5790

## 2019-05-04 LAB — CBC WITH DIFFERENTIAL/PLATELET
Abs Immature Granulocytes: 0.03 10*3/uL (ref 0.00–0.07)
Basophils Absolute: 0 10*3/uL (ref 0.0–0.1)
Basophils Relative: 0 %
Eosinophils Absolute: 0 10*3/uL (ref 0.0–0.5)
Eosinophils Relative: 0 %
HCT: 35.4 % — ABNORMAL LOW (ref 36.0–46.0)
Hemoglobin: 11.5 g/dL — ABNORMAL LOW (ref 12.0–15.0)
Immature Granulocytes: 0 %
Lymphocytes Relative: 7 %
Lymphs Abs: 0.7 10*3/uL (ref 0.7–4.0)
MCH: 28.8 pg (ref 26.0–34.0)
MCHC: 32.5 g/dL (ref 30.0–36.0)
MCV: 88.7 fL (ref 80.0–100.0)
Monocytes Absolute: 0.6 10*3/uL (ref 0.1–1.0)
Monocytes Relative: 6 %
Neutro Abs: 8.1 10*3/uL — ABNORMAL HIGH (ref 1.7–7.7)
Neutrophils Relative %: 87 %
Platelets: 213 10*3/uL (ref 150–400)
RBC: 3.99 MIL/uL (ref 3.87–5.11)
RDW: 13.6 % (ref 11.5–15.5)
WBC: 9.5 10*3/uL (ref 4.0–10.5)
nRBC: 0 % (ref 0.0–0.2)

## 2019-05-04 LAB — URINALYSIS, COMPLETE (UACMP) WITH MICROSCOPIC
Bilirubin Urine: NEGATIVE
Glucose, UA: NEGATIVE mg/dL
Ketones, ur: NEGATIVE mg/dL
Nitrite: POSITIVE — AB
Protein, ur: 30 mg/dL — AB
Specific Gravity, Urine: 1.016 (ref 1.005–1.030)
WBC, UA: >50 WBC/hpf — ABNORMAL HIGH (ref 0–5)
pH: 5 (ref 5.0–8.0)

## 2019-05-04 LAB — LACTIC ACID, PLASMA
Lactic Acid, Venous: 0.9 mmol/L (ref 0.5–1.9)
Lactic Acid, Venous: 0.9 mmol/L (ref 0.5–1.9)

## 2019-05-04 LAB — COMPREHENSIVE METABOLIC PANEL
ALT: 19 U/L (ref 0–44)
AST: 25 U/L (ref 15–41)
Albumin: 3.7 g/dL (ref 3.5–5.0)
Alkaline Phosphatase: 97 U/L (ref 38–126)
Anion gap: 11 (ref 5–15)
BUN: 27 mg/dL — ABNORMAL HIGH (ref 6–20)
CO2: 19 mmol/L — ABNORMAL LOW (ref 22–32)
Calcium: 9.4 mg/dL (ref 8.9–10.3)
Chloride: 108 mmol/L (ref 98–111)
Creatinine, Ser: 1.12 mg/dL — ABNORMAL HIGH (ref 0.44–1.00)
GFR calc Af Amer: >60 mL/min (ref >60)
GFR calc non Af Amer: 57 mL/min — ABNORMAL LOW (ref >60)
Glucose, Bld: 100 mg/dL — ABNORMAL HIGH (ref 70–99)
Potassium: 3.5 mmol/L (ref 3.5–5.1)
Sodium: 138 mmol/L (ref 135–145)
Total Bilirubin: 0.5 mg/dL (ref 0.3–1.2)
Total Protein: 9.2 g/dL — ABNORMAL HIGH (ref 6.5–8.1)

## 2019-05-04 LAB — GLUCOSE, CAPILLARY: Glucose-Capillary: 87 mg/dL (ref 70–99)

## 2019-05-04 LAB — PROTIME-INR
INR: 1.1 (ref 0.8–1.2)
Prothrombin Time: 13.8 seconds (ref 11.4–15.2)

## 2019-05-04 LAB — APTT: aPTT: 28 seconds (ref 24–36)

## 2019-05-04 LAB — SARS CORONAVIRUS 2 BY RT PCR (HOSPITAL ORDER, PERFORMED IN ~~LOC~~ HOSPITAL LAB): SARS Coronavirus 2: NEGATIVE

## 2019-05-04 SURGERY — CYSTOSCOPY, WITH STENT INSERTION
Anesthesia: General | Laterality: Right

## 2019-05-04 MED ORDER — ESCITALOPRAM OXALATE 10 MG PO TABS
10.0000 mg | ORAL_TABLET | Freq: Every day | ORAL | Status: DC
  Administered 2019-05-05 – 2019-05-09 (×5): 10 mg via ORAL
  Filled 2019-05-04 (×5): qty 1

## 2019-05-04 MED ORDER — AMLODIPINE BESYLATE 5 MG PO TABS
10.0000 mg | ORAL_TABLET | Freq: Every day | ORAL | Status: DC
  Filled 2019-05-04: qty 2

## 2019-05-04 MED ORDER — OXYCODONE HCL 5 MG/5ML PO SOLN: 5.0000 mg | Freq: Once | ORAL | Status: DC | PRN

## 2019-05-04 MED ORDER — ENOXAPARIN SODIUM 40 MG/0.4ML ~~LOC~~ SOLN
40.0000 mg | SUBCUTANEOUS | Status: DC
  Administered 2019-05-05 – 2019-05-09 (×5): 40 mg via SUBCUTANEOUS
  Filled 2019-05-04 (×5): qty 0.4

## 2019-05-04 MED ORDER — PROPOFOL 10 MG/ML IV BOLUS
INTRAVENOUS | Status: DC | PRN
  Administered 2019-05-04: 100 mg via INTRAVENOUS
  Administered 2019-05-04: 50 mg via INTRAVENOUS

## 2019-05-04 MED ORDER — LACTATED RINGERS IV SOLN
INTRAVENOUS | Status: DC
  Administered 2019-05-04: 23:00:00 via INTRAVENOUS

## 2019-05-04 MED ORDER — ONDANSETRON HCL 4 MG PO TABS: 4.0000 mg | ORAL_TABLET | Freq: Four times a day (QID) | ORAL | Status: DC | PRN

## 2019-05-04 MED ORDER — PHENYLEPHRINE HCL (PRESSORS) 10 MG/ML IV SOLN
INTRAVENOUS | Status: DC | PRN
  Administered 2019-05-04: 200 ug via INTRAVENOUS
  Administered 2019-05-04: 100 ug via INTRAVENOUS
  Administered 2019-05-04 (×2): 200 ug via INTRAVENOUS
  Administered 2019-05-05: 100 ug via INTRAVENOUS

## 2019-05-04 MED ORDER — FENTANYL CITRATE (PF) 100 MCG/2ML IJ SOLN
INTRAMUSCULAR | Status: AC
  Filled 2019-05-04: qty 2

## 2019-05-04 MED ORDER — ACETAMINOPHEN 325 MG PO TABS
650.0000 mg | ORAL_TABLET | Freq: Once | ORAL | Status: AC | PRN
  Administered 2019-05-04: 650 mg via ORAL
  Filled 2019-05-04: qty 2

## 2019-05-04 MED ORDER — DEXAMETHASONE SODIUM PHOSPHATE 10 MG/ML IJ SOLN
INTRAMUSCULAR | Status: AC
  Filled 2019-05-04: qty 1

## 2019-05-04 MED ORDER — OXYCODONE HCL 5 MG PO TABS: 5.0000 mg | ORAL_TABLET | Freq: Once | ORAL | Status: DC | PRN

## 2019-05-04 MED ORDER — ONDANSETRON HCL 4 MG/2ML IJ SOLN
INTRAMUSCULAR | Status: DC | PRN
  Administered 2019-05-04: 4 mg via INTRAVENOUS

## 2019-05-04 MED ORDER — MIDAZOLAM HCL 2 MG/2ML IJ SOLN
INTRAMUSCULAR | Status: AC
  Filled 2019-05-04: qty 2

## 2019-05-04 MED ORDER — SUCCINYLCHOLINE CHLORIDE 20 MG/ML IJ SOLN
INTRAMUSCULAR | Status: DC | PRN
  Administered 2019-05-04: 100 mg via INTRAVENOUS

## 2019-05-04 MED ORDER — AZATHIOPRINE 50 MG PO TABS
50.0000 mg | ORAL_TABLET | Freq: Every day | ORAL | Status: DC
  Administered 2019-05-05 – 2019-05-09 (×5): 50 mg via ORAL
  Filled 2019-05-04 (×5): qty 1

## 2019-05-04 MED ORDER — MIDAZOLAM HCL 2 MG/2ML IJ SOLN
INTRAMUSCULAR | Status: DC | PRN
  Administered 2019-05-04: 2 mg via INTRAVENOUS

## 2019-05-04 MED ORDER — TRAZODONE HCL 50 MG PO TABS
25.0000 mg | ORAL_TABLET | Freq: Every evening | ORAL | Status: DC | PRN
  Administered 2019-05-05: 25 mg via ORAL
  Filled 2019-05-04: qty 1

## 2019-05-04 MED ORDER — FENTANYL CITRATE (PF) 100 MCG/2ML IJ SOLN
INTRAMUSCULAR | Status: DC | PRN
  Administered 2019-05-04: 25 ug via INTRAVENOUS
  Administered 2019-05-04 (×3): 50 ug via INTRAVENOUS
  Administered 2019-05-05: 25 ug via INTRAVENOUS

## 2019-05-04 MED ORDER — ATENOLOL 25 MG PO TABS
50.0000 mg | ORAL_TABLET | Freq: Every day | ORAL | Status: DC
  Filled 2019-05-04: qty 2

## 2019-05-04 MED ORDER — ATORVASTATIN CALCIUM 20 MG PO TABS
20.0000 mg | ORAL_TABLET | Freq: Every day | ORAL | Status: DC
  Administered 2019-05-05 – 2019-05-08 (×4): 20 mg via ORAL
  Filled 2019-05-04 (×4): qty 1

## 2019-05-04 MED ORDER — FOLIC ACID 1 MG PO TABS
1.0000 mg | ORAL_TABLET | Freq: Every day | ORAL | Status: DC
  Administered 2019-05-05 – 2019-05-09 (×5): 1 mg via ORAL
  Filled 2019-05-04 (×5): qty 1

## 2019-05-04 MED ORDER — ONDANSETRON HCL 4 MG/2ML IJ SOLN: 4.0000 mg | Freq: Four times a day (QID) | INTRAMUSCULAR | Status: DC | PRN

## 2019-05-04 MED ORDER — SODIUM CHLORIDE 0.9 % IV SOLN
INTRAVENOUS | Status: DC
  Administered 2019-05-05 – 2019-05-09 (×8): via INTRAVENOUS

## 2019-05-04 MED ORDER — DEXAMETHASONE SODIUM PHOSPHATE 10 MG/ML IJ SOLN
INTRAMUSCULAR | Status: DC | PRN
  Administered 2019-05-04: 5 mg via INTRAVENOUS

## 2019-05-04 MED ORDER — FENTANYL CITRATE (PF) 100 MCG/2ML IJ SOLN: 25.0000 ug | INTRAMUSCULAR | Status: DC | PRN

## 2019-05-04 MED ORDER — GLYCOPYRROLATE 0.2 MG/ML IJ SOLN
INTRAMUSCULAR | Status: DC | PRN
  Administered 2019-05-04: 0.2 mg via INTRAVENOUS

## 2019-05-04 MED ORDER — SODIUM CHLORIDE 0.9 % IV SOLN
1.0000 g | INTRAVENOUS | Status: DC
  Filled 2019-05-04: qty 10

## 2019-05-04 MED ORDER — ACETAMINOPHEN 325 MG PO TABS
650.0000 mg | ORAL_TABLET | Freq: Four times a day (QID) | ORAL | Status: DC | PRN
  Administered 2019-05-05 – 2019-05-07 (×4): 650 mg via ORAL
  Filled 2019-05-04 (×5): qty 2

## 2019-05-04 MED ORDER — HYDROXYCHLOROQUINE SULFATE 200 MG PO TABS
200.0000 mg | ORAL_TABLET | ORAL | Status: DC
  Administered 2019-05-06 – 2019-05-08 (×2): 200 mg via ORAL
  Filled 2019-05-04 (×4): qty 1

## 2019-05-04 MED ORDER — SODIUM CHLORIDE 0.9 % IV SOLN
1.0000 g | Freq: Once | INTRAVENOUS | Status: AC
  Administered 2019-05-04: 1 g via INTRAVENOUS
  Filled 2019-05-04: qty 10

## 2019-05-04 MED ORDER — GLYCOPYRROLATE 0.2 MG/ML IJ SOLN
INTRAMUSCULAR | Status: AC
  Filled 2019-05-04: qty 1

## 2019-05-04 MED ORDER — ACETAMINOPHEN 650 MG RE SUPP: 650.0000 mg | Freq: Four times a day (QID) | RECTAL | Status: DC | PRN

## 2019-05-04 MED ORDER — LIDOCAINE HCL (PF) 2 % IJ SOLN
INTRAMUSCULAR | Status: AC
  Filled 2019-05-04: qty 10

## 2019-05-04 MED ORDER — MAGNESIUM HYDROXIDE 400 MG/5ML PO SUSP: 30.0000 mL | Freq: Every day | ORAL | Status: DC | PRN

## 2019-05-04 MED ORDER — EPHEDRINE SULFATE 50 MG/ML IJ SOLN
INTRAMUSCULAR | Status: DC | PRN
  Administered 2019-05-04: 10 mg via INTRAVENOUS

## 2019-05-04 MED ORDER — ONDANSETRON HCL 4 MG/2ML IJ SOLN
INTRAMUSCULAR | Status: AC
  Filled 2019-05-04: qty 2

## 2019-05-04 MED ORDER — LIDOCAINE HCL (CARDIAC) PF 100 MG/5ML IV SOSY
PREFILLED_SYRINGE | INTRAVENOUS | Status: DC | PRN
  Administered 2019-05-04: 50 mg via INTRAVENOUS

## 2019-05-04 SURGICAL SUPPLY — 25 items
BAG DRAIN CYSTO-URO LG1000N (MISCELLANEOUS) ×3 IMPLANT
BRUSH SCRUB EZ  4% CHG (MISCELLANEOUS)
BRUSH SCRUB EZ 4% CHG (MISCELLANEOUS) IMPLANT
CATH URETL 5X70 OPEN END (CATHETERS) ×3 IMPLANT
CONRAY 43 FOR UROLOGY 50M (MISCELLANEOUS) IMPLANT
GLOVE BIOGEL PI IND STRL 7.5 (GLOVE) ×3 IMPLANT
GLOVE BIOGEL PI INDICATOR 7.5 (GLOVE) ×6
GOWN STRL REUS W/ TWL LRG LVL3 (GOWN DISPOSABLE) ×2 IMPLANT
GOWN STRL REUS W/ TWL XL LVL3 (GOWN DISPOSABLE) IMPLANT
GOWN STRL REUS W/TWL LRG LVL3 (GOWN DISPOSABLE) ×4
GOWN STRL REUS W/TWL XL LVL3 (GOWN DISPOSABLE)
GUIDEWIRE SENSOR ANG DUAL FLEX (WIRE) ×3 IMPLANT
GUIDEWIRE STR DUAL SENSOR (WIRE) ×3 IMPLANT
KIT TURNOVER CYSTO (KITS) ×3 IMPLANT
PACK CYSTO AR (MISCELLANEOUS) ×3 IMPLANT
SET CYSTO W/LG BORE CLAMP LF (SET/KITS/TRAYS/PACK) ×3 IMPLANT
SLEEVE SCD COMPRESS THIGH MED (MISCELLANEOUS) ×3 IMPLANT
SOL .9 NS 3000ML IRR  AL (IV SOLUTION) ×2
SOL .9 NS 3000ML IRR UROMATIC (IV SOLUTION) ×1 IMPLANT
STENT URET 6FRX24 CONTOUR (STENTS) IMPLANT
STENT URET 6FRX26 CONTOUR (STENTS) IMPLANT
SURGILUBE 2OZ TUBE FLIPTOP (MISCELLANEOUS) ×3 IMPLANT
SYRINGE IRR TOOMEY STRL 70CC (SYRINGE) IMPLANT
VALVE UROSEAL ADJ ENDO (VALVE) ×3 IMPLANT
WATER STERILE IRR 1000ML POUR (IV SOLUTION) ×3 IMPLANT

## 2019-05-04 NOTE — Anesthesia Preprocedure Evaluation (Addendum)
Anesthesia Evaluation  Patient identified by MRN, date of birth, ID band Patient awake    Reviewed: Allergy & Precautions, H&P , NPO status , Patient's Chart, lab work & pertinent test results  Airway Mallampati: III  TM Distance: >3 FB Neck ROM: full    Dental   Pulmonary neg pulmonary ROS, neg COPD, Not current smoker,           Cardiovascular hypertension, (-) angina(-) Past MI and (-) Cardiac Stents (-) dysrhythmias      Neuro/Psych negative neurological ROS  negative psych ROS   GI/Hepatic negative GI ROS, Neg liver ROS,   Endo/Other  diabetes  Renal/GU      Musculoskeletal   Abdominal   Peds  Hematology negative hematology ROS (+)   Anesthesia Other Findings Past Medical History: No date: Diabetes mellitus without complication (HCC)     Comment:  taken off meds in 2017 No date: Hypertension No date: Lupus ([facility])  Past Surgical History: No date: ABDOMINAL HYSTERECTOMY  BMI    Body Mass Index: 33.97 kg/m      Reproductive/Obstetrics negative OB ROS                            Anesthesia Physical Anesthesia Plan  ASA: II  Anesthesia Plan: General ETT   Post-op Pain Management:    Induction:   PONV Risk Score and Plan: Ondansetron, Dexamethasone, Midazolam and Treatment may vary due to age or medical condition  Airway Management Planned:   Additional Equipment:   Intra-op Plan:   Post-operative Plan:   Informed Consent: I have reviewed the patients History and Physical, chart, labs and discussed the procedure including the risks, benefits and alternatives for the proposed anesthesia with the patient or authorized representative who has indicated his/her understanding and acceptance.     Dental Advisory Given  Plan Discussed with: Anesthesiologist, CRNA and Surgeon  Anesthesia Plan Comments:         Anesthesia Quick Evaluation

## 2019-05-04 NOTE — ED Notes (Addendum)
Interpreter in room. Pt states she has trouble peeing for several days. History of transvaginal hysterectomy. Vagina was cut in surgery per pt. Today she has felt bad and she has body aches  and headache. Pt states she has been having fevers. Pt states she has pain in left supravaginal area. Pt has chronic low back pain. Hysterectomoy in December 2019.

## 2019-05-04 NOTE — Consult Note (Addendum)
05/04/19 9:45 PM   Lindsay Dean 05-05-1966 [id number]  CC: Fever/dysuria, right hydronephrosis  HPI: I was asked to see Lindsay Dean in consultation for right hydronephrosis, UTI, and fever from Dr. Rip Harbour. The history was obtained via a Patent attorney. She is a 53 year old Spanish-speaking female presents with fever, dysuria, and abdominal pain.   She has a complex history and from chart review had a cystotomy and right distal ureteral injury during a transvaginal hysterectomy and vaginal vault suspension with urogynecology at [facility] in December 2019 that required intraoperative urology consult and reportedly a perineal right ureteral reimplant.  Her most recent imaging with renal ultrasound and July 2020 showed no hydronephrosis.  She reports her right flank and groin pain started 4 days ago, and fevers over 101 started today. CT was notable for moderate hydroureteronephrosis down to the bladder with a possible punctate stone.  Urinalysis was concerning for infection with many bacteria, greater than 50 WBCs, large leukocytes, nitrite positive.  She was febrile to 103 in the ED with stable vital signs.      PMH: Past Medical History:  Diagnosis Date   Diabetes mellitus without complication ([place])    taken off meds in 2017   Hypertension    Lupus [facility]: [facility])     Surgical History: Past Surgical History:  Procedure Laterality Date   ABDOMINAL HYSTERECTOMY      Allergies:  Allergies  Allergen Reactions   Tramadol Nausea Only    Other reaction(s): Dizziness    Family History: History reviewed. No pertinent family history.  Social History:  reports that she has never smoked. She has never used smokeless tobacco. She reports that she does not drink alcohol or use drugs.  ROS: Please see flowsheet from today's date for complete review of systems.  Physical Exam: BP (!) 118/98    Pulse 85    Temp 100.2 F (37.9 C) (Oral)    Resp (!) 28    Ht 4\' 9"  (1.448  m)    Wt 71.2 kg    SpO2 99%    BMI 33.97 kg/m    Constitutional:  Alert and oriented, No acute distress. Cardiovascular: No clubbing, cyanosis, or edema. Respiratory: Normal respiratory effort, no increased work of breathing. GI: Abdomen is soft, nontender, nondistended, no abdominal masses GU: Right CVA tenderness Lymph: No cervical or inguinal lymphadenopathy. Skin: No rashes, bruises or suspicious lesions. Neurologic: Grossly intact, no focal deficits, moving all 4 extremities. Psychiatric: Normal mood and affect.  Laboratory Data: Reviewed, see HPI  Pertinent Imaging: I have personally reviewed the CT abdomen pelvis-moderate right hydroureteronephrosis down to the junction at the bladder with a possible punctate stone versus stricture of the prior re-implant site  Assessment & Plan:   In summary, the patient is a 53 year old female with complex history including cystotomy and right distal ureteral injury and ureteral reimplant with urology and urogynecology at [facility] in December 2019 who presents with right-sided moderate hydroureteronephrosis down to the bladder with a possible punctate stone versus stricture of her reimplant site and is febrile to 103 with infected urine and stable vital signs.  We discussed the need for attempted urgent ureteral stent placement in the setting of an infected obstructed system.  We discussed the risks and benefits at length including bleeding, infection, sepsis, ureteral injury, possible need for staged treatment, possible inability to place stent requiring nephrostomy tube, and possible need for revision of reimplant in the future.  OR tonight for attempted right ureteral stent placement Admission  to hospitalist for broad-spectrum antibiotics   Sondra Come, MD  [facility] [address] [place], [place] [address] [phone]

## 2019-05-04 NOTE — Anesthesia Procedure Notes (Signed)
Procedure Name: Intubation Performed by: Rolla Plate, CRNA Pre-anesthesia Checklist: Patient identified, Patient being monitored, Timeout performed, Emergency Drugs available and Suction available Patient Re-evaluated:Patient Re-evaluated prior to induction Oxygen Delivery Method: Circle system utilized Preoxygenation: Pre-oxygenation with 100% oxygen Induction Type: IV induction and Rapid sequence Laryngoscope Size: Charlyne Petrin and 2 Grade View: Grade I Tube type: Oral Tube size: 7.0 mm Number of attempts: 1 Airway Equipment and Method: Stylet Placement Confirmation: ETT inserted through vocal cords under direct vision,  positive ETCO2 and breath sounds checked- equal and bilateral Secured at: 21 cm Tube secured with: Tape Dental Injury: Teeth and Oropharynx as per pre-operative assessment

## 2019-05-04 NOTE — H&P (Signed)
[facility] [place] NAME: Lindsay Dean    MR#:  [id number]  DATE OF BIRTH:  08-14-1965  DATE OF ADMISSION:  05/04/2019  PRIMARY CARE PHYSICIAN: Alene Mires Elyse Jarvis, MD   REQUESTING/REFERRING PHYSICIAN: Conni Slipper, MD CHIEF COMPLAINT:   Chief Complaint  Patient presents with   Dysuria    HISTORY OF PRESENT ILLNESS:  Lindsay Dean  is a 53 y.o. female with a known history of type 2 diabetes mellitus, hypertension and systemic lupus erythematosus, presented to the emergency room with a consult of fever and chills today with associated nausea without vomiting.  She has been having bilateral flank pain.  She admitted to urinary frequency and urgency with associated dysuria without hematuria.  No cough or wheezing or shortness of breath.  No chest pain or palpitations.  Upon presentation to the emergency room, temperature was 103.1, heart rate 85 with a blood pressure of 118/98 respiratory to 28 and pulse symmetry of 99% on room air.  Labs revealed borderline potassium of 3.5 and lactic acid level of 0.9 twice.  Creatinine was 1.12 up from 0.82 by a year ago.  CBC showed mild anemia with hemoglobin of 11.5 hematocrit 35.4 close to previous levels.  Urinalysis was strongly positive for UTI with many bacteria and more than 50 WBCs.  Blood culture as well as urine culture was sent.  Two-view chest x-ray showed no acute cardiopulmonary disease.  EKG showed no sinus rhythm with a rate of 82 with minimal voltage criteria for LVH. Abdominal pelvic CT scan showed moderate right hydroureteral nephrosis with the ureter dilated to the bladder insertion.  There is possible but not definite punctate distal ureteric stone at the UV junction.  Right perinephric edema may be obstructing or infectious.  There was additional nonobstructing bilateral nephrolithiasis and colonic diverticulosis without diverticulitis.  She had a pelvic reconstructive surgery at [facility] in  September of this year and has been followed by Dr. Karen Chafe with urogynecology.  The patient was given 1 g of IV Rocephin and 650 mg p.o. Tylenol.  On-call urologist Dr. Diamantina Providence was notified.  She will be admitted to medically monitored bed for further evaluation and management PAST MEDICAL HISTORY:   Past Medical History:  Diagnosis Date   Diabetes mellitus without complication ([place])    taken off meds in 2017   Hypertension    Lupus ([place])     PAST SURGICAL HISTORY:   Past Surgical History:  Procedure Laterality Date   ABDOMINAL HYSTERECTOMY      SOCIAL HISTORY:   Social History   Tobacco Use   Smoking status: Never Smoker   Smokeless tobacco: Never Used  Substance Use Topics   Alcohol use: No    FAMILY HISTORY:  History reviewed. No pertinent family history.  DRUG ALLERGIES:   Allergies  Allergen Reactions   Tramadol Nausea Only    Other reaction(s): Dizziness    REVIEW OF SYSTEMS:   ROS As per history of present illness. All pertinent systems were reviewed above. Constitutional,  HEENT, cardiovascular, respiratory, GI, GU, musculoskeletal, neuro, psychiatric, endocrine,  integumentary and hematologic systems were reviewed and are otherwise  negative/unremarkable except for positive findings mentioned above in the HPI.   MEDICATIONS AT HOME:   Prior to Admission medications   Medication Sig Start Date End Date Taking? Authorizing Provider  amLODipine (NORVASC) 10 MG tablet Take 10 mg by mouth daily.   Yes [provider]  atenolol (TENORMIN) 50 MG  tablet Take 50 mg by mouth daily.   Yes [provider]  atorvastatin (LIPITOR) 20 MG tablet Take 20 mg by mouth daily.   Yes [provider]  escitalopram (LEXAPRO) 10 MG tablet Take 10 mg by mouth daily.   Yes [provider]  folic acid (FOLVITE) 1 MG tablet Take 1 mg by mouth daily.   Yes [provider]  hydroxychloroquine (PLAQUENIL) 200 MG tablet  Take 200 mg by mouth every other day. 03/15/19 03/14/20 Yes [provider]  azaTHIOprine (IMURAN) 50 MG tablet Take 50 mg by mouth daily.    [provider]      VITAL SIGNS:  Blood pressure (!) 118/98, pulse 85, temperature 100.2 F (37.9 C), temperature source Oral, resp. rate (!) 28, height 4\' 9"  (1.448 m), weight 71.2 kg, SpO2 99 %.  PHYSICAL EXAMINATION:  Physical Exam  GENERAL:  53 y.o.-year-old patient lying in the bed with no acute distress.  EYES: Pupils equal, round, reactive to light and accommodation. No scleral icterus. Extraocular muscles intact.  HEENT: Head atraumatic, normocephalic. Oropharynx and nasopharynx clear.  NECK:  Supple, no jugular venous distention. No thyroid enlargement, no tenderness.  LUNGS: Normal breath sounds bilaterally, no wheezing, rales,rhonchi or crepitation. No use of accessory muscles of respiration.  CARDIOVASCULAR: Regular rate and rhythm, S1, S2 normal. No murmurs, rubs, or gallops.  ABDOMEN: Soft, nondistended, nontender. Bowel sounds present. No organomegaly or mass.  She had bilateral CVA tenderness EXTREMITIES: No pedal edema, cyanosis, or clubbing.  NEUROLOGIC: Cranial nerves II through XII are intact. Muscle strength 5/5 in all extremities. Sensation intact. Gait not checked.  PSYCHIATRIC: The patient is alert and oriented x 3.  Normal affect and good eye contact. SKIN: No obvious rash, lesion, or ulcer.   LABORATORY PANEL:   CBC Recent Labs  Lab 05/04/19 1551  WBC 9.5  HGB 11.5*  HCT 35.4*  PLT 213   ------------------------------------------------------------------------------------------------------------------  Chemistries  Recent Labs  Lab 05/04/19 1551  NA 138  K 3.5  CL 108  CO2 19*  GLUCOSE 100*  BUN 27*  CREATININE 1.12*  CALCIUM 9.4  AST 25  ALT 19  ALKPHOS 97  BILITOT 0.5    ------------------------------------------------------------------------------------------------------------------  Cardiac Enzymes No results for input(s): TROPONINI in the last 168 hours. ------------------------------------------------------------------------------------------------------------------  RADIOLOGY:  Ct Abdomen Pelvis Wo Contrast  Result Date: 05/04/2019 CLINICAL DATA:  Fever and abdominal pain. EXAM: CT ABDOMEN AND PELVIS WITHOUT CONTRAST TECHNIQUE: Multidetector CT imaging of the abdomen and pelvis was performed following the standard protocol without IV contrast. COMPARISON:  CT 05/13/2018 FINDINGS: Lower chest: Lung bases are clear. Remote bilateral posterior rib fractures. Borderline cardiomegaly. Hepatobiliary: No focal liver abnormality is seen. Status post cholecystectomy. No biliary dilatation. Pancreas: No ductal dilatation or inflammation. Spleen: Normal in size without focal abnormality. Adrenals/Urinary Tract: No adrenal nodule. Moderate right hydroureteronephrosis. Ureter is dilated to the bladder insertion. Possible but not definite punctate distal ureteric stone at the ureterovesicular junction, series 5, image 53. Moderate right perinephric edema. Punctate nonobstructing stones in the mid lower right kidney. Nonobstructing stones in the left kidney. No left hydronephrosis. Trace left perinephric edema. The left ureter is decompressed. No ureteral stone. Urinary bladder is minimally distended. No bladder stone. Stomach/Bowel: Stomach is unremarkable. No small bowel wall thickening or inflammatory change. No obstruction. Normal appendix. Multifocal colonic diverticulosis, most prominent in the sigmoid colon. No acute diverticulitis. Vascular/Lymphatic: Mild aortic atherosclerosis. More advanced bi-iliac and branch atherosclerosis. No aneurysm. No bulky abdominopelvic adenopathy. Reproductive:  Post hysterectomy. Ovaries tentatively visualized and quiescent. No suspicious  adnexal mass. Other: No free air, free fluid, or intra-abdominal fluid collection. 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 Additional degenerative disc disease at L5-S1. There are no acute or suspicious osseous abnormalities. Scattered calcified gluteal calcifications. Remote bilateral posterior rib fractures. IMPRESSION: 1. Moderate right hydroureteronephrosis, ureter dilated to the bladder insertion. Possible but not definite punctate distal ureteric stone at the ureterovesicular junction. Right perinephric edema may be obstructive or infectious. 2. Additional nonobstructing bilateral nephrolithiasis. 3. Colonic diverticulosis without acute inflammation. Aortic Atherosclerosis (ICD10-I70.0). Electronically Signed   By: Narda Rutherford M.D.   On: 05/04/2019 20:06   Dg Chest 2 View  Result Date: 05/04/2019 CLINICAL DATA:  54 year old female with sepsis. EXAM: CHEST - 2 VIEW COMPARISON:  None. FINDINGS: The lungs are clear. There is no pleural effusion or pneumothorax. The cardiac silhouette is within normal limits. No acute osseous pathology. Right upper quadrant cholecystectomy clips. IMPRESSION: No active cardiopulmonary disease. Electronically Signed   By: Elgie Collard M.D.   On: 05/04/2019 16:56      IMPRESSION AND PLAN:   1.  UTI with subsequent mild sepsis without severe sepsis or septic shock in the setting of right-sided suspected obstructive uropathy.  Her sepsis is manifested by high fever and tachypnea.  The patient will be admitted to the medical monitored bed.  She will be continued on IV Rocephin.  Will follow urine and blood cultures and sensitivity.  She will be hydrated with IV normal saline.  We will keep her n.p.o.  We will obtain urology consultation.  I contacted Dr. Richardo Hanks about the patient.  2.  Mild acute kidney injury.  This is secondary to obstructive uropathy as evidenced by her right hydroureter  which could be acute on chronic.  She will be hydrated with IV normal saline and will follow her BMP.  Urology consultation will be obtained.  3.  Hypertension.  Her Norvasc, hydralazine and Tenormin will be continued and will hold chlorthalidone.  While n.p.o. she will be placed on as needed IV labetalol  4.  Dyslipidemia.  Statin therapy will be resumed.  5.  Systemic lupus erythematosus.  We will continue her Plaquenil and Imuran.  6.  Overactive bladder.  Ditropan XL and Myrbetriq will be continued.  7.  DVT prophylaxis.  Subcutaneous Lovenox    All the records are reviewed and case discussed with ED provider. The plan of care was discussed in details with the patient (and family). I answered all questions. The patient agreed to proceed with the above mentioned plan. Further management will depend upon hospital course.   CODE STATUS: Full code  TOTAL TIME TAKING CARE OF THIS PATIENT: 55 minutes.    Hannah Beat M.D on 05/04/2019 at 8:22 PM  [facility]   From 7 PM-7 AM, contact night-coverage [email or web address]  CC: Primary care physician; Revelo, Presley Raddle, MD   Note: This dictation was prepared with Dragon dictation along with smaller phrase technology. Any transcriptional errors that result from this process are unintentional.

## 2019-05-04 NOTE — Anesthesia Post-op Follow-up Note (Signed)
Anesthesia QCDR form completed.        

## 2019-05-04 NOTE — Consult Note (Signed)
CODE SEPSIS - PHARMACY COMMUNICATION  **Broad Spectrum Antibiotics should be administered within 1 hour of Sepsis diagnosis**  Time Code Sepsis Called/Page Received: 1922  Antibiotics Ordered: Ceftriaxone  Time of 1st antibiotic administration: 1845  Additional action taken by pharmacy: None  If necessary, Name of Provider/Nurse Contacted: None   Lu Duffel, PharmD, BCPS Clinical Pharmacist 05/04/2019 7:31 PM

## 2019-05-04 NOTE — ED Provider Notes (Addendum)
[facility] Emergency Department Provider Note   ____________________________________________   First MD Initiated Contact with Patient 05/04/19 1723     (approximate)  I have reviewed the triage vital signs and the nursing notes.   HISTORY  Chief Complaint Dysuria    HPI Lindsay Dean is a 53 y.o. female patient complains of 4 days of dysuria fever and not feeling well.  She has body aches and a headache today.  She reports she had a hysterectomy and they apparently nicked her vagina during it.   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 Diabetes mellitus without complication ([place])    taken off meds in 2017  . Hypertension   . Lupus ([place])     There are no active problems to display for this patient.   Past Surgical History:  Procedure Laterality Date  . ABDOMINAL HYSTERECTOMY      Prior to Admission medications   Medication Sig Start Date End Date Taking? Authorizing Provider  amLODipine (NORVASC) 10 MG tablet Take 10 mg by mouth daily.   Yes [provider]  atenolol (TENORMIN) 50 MG tablet Take 50 mg by mouth daily.   Yes [provider]  atorvastatin (LIPITOR) 20 MG tablet Take 20 mg by mouth daily.   Yes [provider]  escitalopram (LEXAPRO) 10 MG tablet Take 10 mg by mouth daily.   Yes [provider]  folic acid (FOLVITE) 1 MG tablet Take 1 mg by mouth daily.   Yes [provider]  azaTHIOprine (IMURAN) 50 MG tablet Take 50 mg by mouth daily.    [provider]  diclofenac (VOLTAREN) 50 MG EC tablet Take 1 tablet (50 mg total) by mouth 2 (two) times daily. 02/13/17   Menshew, Dannielle Karvonen, PA-C  promethazine (PHENERGAN) 12.5 MG tablet Take 1 tablet (12.5  mg total) by mouth every 6 (six) hours as needed for nausea or vomiting. 05/13/18   Merlyn Lot, MD    Allergies Tramadol  History reviewed. No pertinent family history.  Social History Social History   Tobacco Use  . Smoking status: Never Smoker  . Smokeless tobacco: Never Used  Substance Use Topics  . Alcohol use: No  . Drug use: No    Review of Systems  Constitutional: fever/chills Eyes: No visual changes. ENT: No sore throat. Cardiovascular: Denies chest pain. Respiratory: Denies shortness of breath. Gastrointestinal: Lower abdominal pain.  No nausea, no vomiting.  No diarrhea.  No constipation. Genitourinary: Negative for dysuria. Musculoskeletal: Negative for back pain. Skin: Negative for rash. Neurological: Negative for headaches, focal weakness   ____________________________________________   PHYSICAL EXAM:  VITAL SIGNS: ED Triage Vitals [05/04/19 1536]  Enc Vitals Group     BP (!) 118/98     Pulse Rate 85     Resp (!) 28     Temp (!) 103.1 F (39.5 C)     Temp Source Oral     SpO2 99 %     Weight 157 lb (71.2 kg)     Height 4\' 9"  (1.448 m)     Head Circumference      Peak Flow      Pain Score 0     Pain Loc      Pain Edu?  Excl. in GC?     Constitutional: Alert and oriented.  Appears to be uncomfortable. Eyes: Conjunctivae are normal.  Head: Atraumatic. Nose: No congestion/rhinnorhea. Mouth/Throat: Mucous membranes are moist.  Oropharynx non-erythematous. Neck: No stridor. Cardiovascular: Rapid rate, regular rhythm. Grossly normal heart sounds.  Good peripheral circulation. Respiratory: Normal respiratory effort.  No retractions. Lungs CTAB. Gastrointestinal: Soft and nontender except immediately suprapubically where it is tender no distention. No abdominal bruits. No CVA tenderness. Musculoskeletal: No lower extremity tenderness nor edema.   Neurologic:  Normal speech and language. No gross focal neurologic deficits are  appreciated Skin:  Skin is warm, dry and intact. No rash noted.   ____________________________________________   LABS (all labs ordered are listed, but only abnormal results are displayed)  Labs Reviewed  COMPREHENSIVE METABOLIC PANEL - Abnormal; Notable for the following components:      Result Value   CO2 19 (*)    Glucose, Bld 100 (*)    BUN 27 (*)    Creatinine, Ser 1.12 (*)    Total Protein 9.2 (*)    GFR calc non Af Amer 57 (*)    All other components within normal limits  CBC WITH DIFFERENTIAL/PLATELET - Abnormal; Notable for the following components:   Hemoglobin 11.5 (*)    HCT 35.4 (*)    Neutro Abs 8.1 (*)    All other components within normal limits  URINALYSIS, COMPLETE (UACMP) WITH MICROSCOPIC - Abnormal; Notable for the following components:   Color, Urine YELLOW (*)    APPearance CLOUDY (*)    Hgb urine dipstick SMALL (*)    Protein, ur 30 (*)    Nitrite POSITIVE (*)    Leukocytes,Ua LARGE (*)    WBC, UA >50 (*)    Bacteria, UA MANY (*)    All other components within normal limits  CULTURE, BLOOD (ROUTINE X 2)  CULTURE, BLOOD (ROUTINE X 2)  SARS CORONAVIRUS 2 (TAT 6-24 HRS)  URINE CULTURE  LACTIC ACID, PLASMA  LACTIC ACID, PLASMA  PROTIME-INR  GLUCOSE, CAPILLARY  APTT  URINALYSIS, ROUTINE W REFLEX MICROSCOPIC   ____________________________________________  EKG   ____________________________________________  RADIOLOGY  ED MD interpretation: Chest x-ray read by radiology reviewed by me is negative  Official radiology report(s): Dg Chest 2 View  Result Date: 05/04/2019 CLINICAL DATA:  53 year old female with sepsis. EXAM: CHEST - 2 VIEW COMPARISON:  None. FINDINGS: The lungs are clear. There is no pleural effusion or pneumothorax. The cardiac silhouette is within normal limits. No acute osseous pathology. Right upper quadrant cholecystectomy clips. IMPRESSION: No active cardiopulmonary disease. Electronically Signed   By: Elgie Collard M.D.    On: 05/04/2019 16:56    ____________________________________________   PROCEDURES  Procedure(s) performed (including Critical Care): Critical care time 45 minutes this includes speaking with the hospitalist several times speaking with the patient several times and speaking with urology.  Procedures  CT comes back after the patient was admitted.  Shows an obstruction.  Discussed patient with Dr. Gabrielle Dare who will come and stent the patient ____________________________________________   INITIAL IMPRESSION / ASSESSMENT AND PLAN / ED COURSE  Patient with high fever and UTI blood pressures on the low side respiratory rate is high she does actually seem to meet sepsis criteria.  We will see if we can get her in the hospital she would prefer that.  CT is pending to see if there is anything going on at the surgical site.  This would be unusual as they removed prosthetic graft and  it was several months ago              ____________________________________________   FINAL CLINICAL IMPRESSION(S) / ED DIAGNOSES  Final diagnoses:  Fever, unspecified fever cause  Sepsis, due to unspecified organism, unspecified whether acute organ dysfunction present [facility](HCC)     ED Discharge Orders    None       Note:  This document was prepared using Dragon voice recognition software and may include unintentional dictation errors.    Arnaldo NatalMalinda, Dillyn Menna F, MD 05/04/19 Kristopher [id number]    Arnaldo NatalMalinda, Hong Moring F, MD 05/04/19 [phone]

## 2019-05-04 NOTE — ED Notes (Signed)
Spoke to lab at this time about turning covid swab into a rapid for surgery

## 2019-05-04 NOTE — ED Notes (Signed)
Blood culture from right ac by dorian NT

## 2019-05-04 NOTE — ED Triage Notes (Signed)
Pt here for fever and dysuria X 4 days. Took 3 tylenol at 1200 today.  Febrile in triage. Tachypnea noted but unlabored.  NAD at this time. No pain currently. No vomiting but some nausea.

## 2019-05-05 ENCOUNTER — Inpatient Hospital Stay: Payer: Medicaid Other

## 2019-05-05 ENCOUNTER — Encounter: Payer: Self-pay | Admitting: Urology

## 2019-05-05 LAB — BASIC METABOLIC PANEL
Anion gap: 10 (ref 5–15)
BUN: 30 mg/dL — ABNORMAL HIGH (ref 6–20)
CO2: 18 mmol/L — ABNORMAL LOW (ref 22–32)
Calcium: 8.3 mg/dL — ABNORMAL LOW (ref 8.9–10.3)
Chloride: 112 mmol/L — ABNORMAL HIGH (ref 98–111)
Creatinine, Ser: 1.09 mg/dL — ABNORMAL HIGH (ref 0.44–1.00)
GFR calc Af Amer: >60 mL/min (ref >60)
GFR calc non Af Amer: 59 mL/min — ABNORMAL LOW (ref >60)
Glucose, Bld: 157 mg/dL — ABNORMAL HIGH (ref 70–99)
Potassium: 3.6 mmol/L (ref 3.5–5.1)
Sodium: 140 mmol/L (ref 135–145)

## 2019-05-05 LAB — CBC
HCT: 31.2 % — ABNORMAL LOW (ref 36.0–46.0)
Hemoglobin: 10 g/dL — ABNORMAL LOW (ref 12.0–15.0)
MCH: 28.7 pg (ref 26.0–34.0)
MCHC: 32.1 g/dL (ref 30.0–36.0)
MCV: 89.7 fL (ref 80.0–100.0)
Platelets: 177 10*3/uL (ref 150–400)
RBC: 3.48 MIL/uL — ABNORMAL LOW (ref 3.87–5.11)
RDW: 13.8 % (ref 11.5–15.5)
WBC: 13.5 10*3/uL — ABNORMAL HIGH (ref 4.0–10.5)
nRBC: 0 % (ref 0.0–0.2)

## 2019-05-05 LAB — URINALYSIS, ROUTINE W REFLEX MICROSCOPIC
Bilirubin Urine: NEGATIVE
Glucose, UA: NEGATIVE mg/dL
Ketones, ur: NEGATIVE mg/dL
Nitrite: NEGATIVE
Protein, ur: 30 mg/dL — AB
Specific Gravity, Urine: 1.015 (ref 1.005–1.030)
WBC, UA: >50 WBC/hpf — ABNORMAL HIGH (ref 0–5)
pH: 6 (ref 5.0–8.0)

## 2019-05-05 LAB — HIV ANTIBODY (ROUTINE TESTING W REFLEX): HIV Screen 4th Generation wRfx: NONREACTIVE

## 2019-05-05 LAB — GLUCOSE, CAPILLARY: Glucose-Capillary: 111 mg/dL — ABNORMAL HIGH (ref 70–99)

## 2019-05-05 LAB — SURGICAL PCR SCREEN
MRSA, PCR: NEGATIVE
Staphylococcus aureus: POSITIVE — AB

## 2019-05-05 LAB — BLOOD CULTURE ID PANEL (REFLEXED)

## 2019-05-05 MED ORDER — MIDAZOLAM HCL 5 MG/5ML IJ SOLN
INTRAMUSCULAR | Status: AC
  Administered 2019-05-05: 14:00:00
  Filled 2019-05-05: qty 5

## 2019-05-05 MED ORDER — MUPIROCIN 2 % EX OINT
1.0000 "application " | TOPICAL_OINTMENT | Freq: Two times a day (BID) | CUTANEOUS | Status: DC
  Administered 2019-05-05 – 2019-05-09 (×9): 1 via NASAL
  Filled 2019-05-05 (×2): qty 22

## 2019-05-05 MED ORDER — SODIUM CHLORIDE 0.9% FLUSH: 10.0000 mL | INTRAVENOUS | Status: DC | PRN

## 2019-05-05 MED ORDER — ATENOLOL 50 MG PO TABS
50.0000 mg | ORAL_TABLET | Freq: Every day | ORAL | Status: DC
  Administered 2019-05-05 – 2019-05-09 (×4): 50 mg via ORAL
  Filled 2019-05-05 (×3): qty 1

## 2019-05-05 MED ORDER — FENTANYL CITRATE (PF) 100 MCG/2ML IJ SOLN
INTRAMUSCULAR | Status: AC | PRN
  Administered 2019-05-05: 50 ug via INTRAVENOUS

## 2019-05-05 MED ORDER — AMLODIPINE BESYLATE 10 MG PO TABS
10.0000 mg | ORAL_TABLET | Freq: Every day | ORAL | Status: DC
  Administered 2019-05-05 – 2019-05-09 (×4): 10 mg via ORAL
  Filled 2019-05-05 (×2): qty 1
  Filled 2019-05-05: qty 2

## 2019-05-05 MED ORDER — MIDAZOLAM HCL 2 MG/2ML IJ SOLN
INTRAMUSCULAR | Status: AC | PRN
  Administered 2019-05-05: 1 mg via INTRAVENOUS
  Administered 2019-05-05: 2 mg via INTRAVENOUS

## 2019-05-05 MED ORDER — SODIUM CHLORIDE 0.9 % IV SOLN
1.0000 g | Freq: Three times a day (TID) | INTRAVENOUS | Status: DC
  Administered 2019-05-05 – 2019-05-08 (×9): 1 g via INTRAVENOUS
  Filled 2019-05-05 (×13): qty 1

## 2019-05-05 MED ORDER — FENTANYL CITRATE (PF) 100 MCG/2ML IJ SOLN
INTRAMUSCULAR | Status: AC
  Administered 2019-05-05: 14:00:00
  Filled 2019-05-05: qty 2

## 2019-05-05 MED ORDER — CHLORHEXIDINE GLUCONATE CLOTH 2 % EX PADS
6.0000 | MEDICATED_PAD | Freq: Every day | CUTANEOUS | Status: DC
  Administered 2019-05-05 – 2019-05-09 (×5): 6 via TOPICAL

## 2019-05-05 NOTE — Transfer of Care (Signed)
Immediate Anesthesia Transfer of Care Note  Patient: Lindsay Dean  Procedure(s) Performed: CYSTOSCOPY with attempt of stent placement (Right )  Patient Location: PACU  Anesthesia Type:General  Level of Consciousness: sedated  Airway & Oxygen Therapy: Patient Spontanous Breathing and Patient connected to face mask oxygen  Post-op Assessment: Report given to RN and Post -op Vital signs reviewed and stable  Post vital signs: Reviewed  Last Vitals:  Vitals Value Taken Time  BP 100/64 05/05/19 0015  Temp    Pulse 90 05/05/19 0015  Resp 20 05/05/19 0015  SpO2 100 % 05/05/19 0015    Last Pain:  Vitals:   05/04/19 1800  TempSrc: Oral  PainSc:          Complications: No apparent anesthesia complications

## 2019-05-05 NOTE — Progress Notes (Signed)
PROGRESS NOTE  Lindsay Dean VQQ:[id number] DOB: 1966-06-22 DOA: 05/04/2019 PCP: Preston Fleetingevelo, Adrian Mancheno, MD  HPI/Recap of past 24 hours: Lindsay Dean  is a 53 y.o. female with a known history of type 2 diabetes mellitus, hypertension and systemic lupus erythematosus, presented to the emergency room with a consult of fever and chills today with associated nausea without vomiting.  She has been having bilateral flank pain.  She admitted to urinary frequency and urgency with associated dysuria without hematuria.  No cough or wheezing or shortness of breath.  No chest pain or palpitations.  Upon presentation to the emergency room, temperature was 103.1, heart rate 85 with a blood pressure of 118/98 respiratory to 28 and pulse symmetry of 99% on room air.  Labs revealed borderline potassium of 3.5 and lactic acid level of 0.9 twice.  Creatinine was 1.12 up from 0.82 by a year ago.  CBC showed mild anemia with hemoglobin of 11.5 hematocrit 35.4 close to previous levels.  Urinalysis was strongly positive for UTI with many bacteria and more than 50 WBCs.  Blood culture as well as urine culture was sent.  Two-view chest x-ray showed no acute cardiopulmonary disease.  EKG showed no sinus rhythm with a rate of 82 with minimal voltage criteria for LVH. Abdominal pelvic CT scan showed moderate right hydroureteral nephrosis with the ureter dilated to the bladder insertion.  There is possible but not definite punctate distal ureteric stone at the UV junction.  Right perinephric edema may be obstructing or infectious.  There was additional nonobstructing bilateral nephrolithiasis and colonic diverticulosis without diverticulitis.  She [facility] this year and has been followed by Dr. Lauris ChromanJohn Jelovsek with urogynecology.  The patient was given 1 g of IV Rocephin and 650 mg p.o. Tylenol.  On-call urologist Dr. Richardo HanksSninskY was notified.  She will be admitted to  medically monitored bed for further evaluation and management.  05/05/19: Patient was seen and examined at her bedside this morning.  Patient reports her right flank pain is improved with pain medication.  POD #0 post cystoscopy/right ureteral stent placement by Dr. Richardo HanksSninsky, urology.  Assessment/Plan: Active Problems:   Sepsis (HCC)  Sepsis secondary to right pyelonephritis and UTI Presented with fever with T-max 103.1 and leukocytosis CT scan suggestive of right pyelonephritis Started on IV antibiotics empirically, continue on Rocephin until ID and sensitivities result Post right ureteral stent placement 05/05/19 by urology. Continue IV fluid hydration Monitor fever curve and WBC Monitor urine output Urine culture blood cultures in process, follow cultures Repeat CBC in the morning  AKI likely prerenal in the setting of dehydration Baseline creatinine 0.8 with GFR greater than 60 Presented with creatinine of 1.12 with GFR of 57 Continue IV fluid hydration Continue to avoid nephrotoxins like NSAIDs Continue to monitor urine output Repeat BMP in the morning  Hypertension.   Continue home antihypertensive medications  Blood pressure is currently at goal  Continue to monitor vital signs   Dyslipidemia.    Continue home medications  Systemic lupus erythematosus.    Continue her Plaquenil and Imuran.  Overactive bladder.    Continue Ditropan XL and Myrbetriq   DVT prophylaxis.  Subcutaneous Lovenox  Disposition: Patient is currently not appropriate for discharge due to ongoing treatment for sepsis secondary to UTI and right pyelonephritis.  Patient will require at least 2 midnights for further evaluation and treatment of present condition.  CODE STATUS: Full code    Objective: Vitals:  05/05/19 1100 05/05/19 1115 05/05/19 1139 05/05/19 1450  BP: 122/74  121/73 122/69  Pulse: (!) 57 (!) 57 (!) 56 74  Resp: Temp:   97.6 F (36.4 C) 98.2 F (36.8 C)    TempSrc:   Oral Oral  SpO2: 95% 95% 97% 100%  Weight:      Height:        Intake/Output Summary (Last 24 hours) at 05/05/2019 1458 Last data filed at 05/05/2019 1300 Gross per 24 hour  Intake 1140 ml  Output 0 ml  Net 1140 ml   Filed Weights   05/04/19 1536  Weight: 71.2 kg    Exam:   General: 53 y.o. year-old female well developed well nourished in no acute distress.  Alert and oriented x4.  Cardiovascular: Regular rate and rhythm with no rubs or gallops.  No thyromegaly or JVD noted.    Respiratory: Clear to auscultation with no wheezes or rales. Good inspiratory effort.  Abdomen: Soft nontender nondistended with normal bowel sounds x4 quadrants.  Musculoskeletal: No lower extremity edema. 2/4 pulses in all 4 extremities.  Psychiatry: Mood is appropriate for condition and setting   Data Reviewed: CBC: Recent Labs  Lab 05/04/19 1551 05/05/19 0454  WBC 9.5 13.5*  NEUTROABS 8.1*  --   HGB 11.5* 10.0*  HCT 35.4* 31.2*  MCV 88.7 89.7  PLT 213 177   Basic Metabolic Panel: Recent Labs  Lab 05/04/19 1551 05/05/19 0454  NA 138 140  K 3.5 3.6  CL 108 112*  CO2 19* 18*  GLUCOSE 100* 157*  BUN 27* 30*  CREATININE 1.12* 1.09*  CALCIUM 9.4 8.3*   GFR: Estimated Creatinine Clearance: 50.3 mL/min (A) (by C-G formula based on SCr of 1.09 mg/dL (H)). Liver Function Tests: Recent Labs  Lab 05/04/19 1551  AST 25  ALT 19  ALKPHOS 97  BILITOT 0.5  PROT 9.2*  ALBUMIN 3.7   No results for input(s): LIPASE, AMYLASE in the last 168 hours. No results for input(s): AMMONIA in the last 168 hours. Coagulation Profile: Recent Labs  Lab 05/04/19 1551  INR 1.1   Cardiac Enzymes: No results for input(s): CKTOTAL, CKMB, CKMBINDEX, TROPONINI in the last 168 hours. BNP (last 3 results) No results for input(s): PROBNP in the last 8760 hours. HbA1C: No results for input(s): HGBA1C in the last 72 hours. CBG: Recent Labs  Lab 05/04/19 1543 05/05/19 0022  GLUCAP  87 111*   Lipid Profile: No results for input(s): CHOL, HDL, LDLCALC, TRIG, CHOLHDL, LDLDIRECT in the last 72 hours. Thyroid Function Tests: No results for input(s): TSH, T4TOTAL, FREET4, T3FREE, THYROIDAB in the last 72 hours. Anemia Panel: No results for input(s): VITAMINB12, FOLATE, FERRITIN, TIBC, IRON, RETICCTPCT in the last 72 hours. Urine analysis:    Component Value Date/Time   COLORURINE YELLOW (A) 05/05/2019 0841   APPEARANCEUR HAZY (A) 05/05/2019 0841   LABSPEC 1.015 05/05/2019 0841   PHURINE 6.0 05/05/2019 0841   GLUCOSEU NEGATIVE 05/05/2019 0841   HGBUR MODERATE (A) 05/05/2019 0841   BILIRUBINUR NEGATIVE 05/05/2019 0841   KETONESUR NEGATIVE 05/05/2019 0841   PROTEINUR 30 (A) 05/05/2019 0841   NITRITE NEGATIVE 05/05/2019 0841   LEUKOCYTESUR MODERATE (A) 05/05/2019 0841   Sepsis Labs: (procalcitonin:4,lacticidven:4)  ) Recent Results (from the past 240 hour(s))  Culture, blood (Routine x 2)     Status: None (Preliminary result)   Collection Time: 05/04/19  3:51 PM   Specimen: BLOOD  Result Value Ref Range Status  Specimen Description BLOOD RIGHT ANTECUBITAL  Final   Special Requests   Final    BOTTLES DRAWN AEROBIC AND ANAEROBIC Blood Culture results may not be optimal due to an excessive volume of blood received in culture bottles   Culture  Setup Time Organism ID to follow  Final   Culture   Final    NO GROWTH < 24 HOURS Performed at [facility], [address] [place], [place] [address]    Report Status PENDING  Incomplete  Culture, blood (Routine x 2)     Status: None (Preliminary result)   Collection Time: 05/04/19  3:54 PM   Specimen: BLOOD  Result Value Ref Range Status   Specimen Description BLOOD LEFT ANTECUBITAL  Final   Special Requests   Final    BOTTLES DRAWN AEROBIC AND ANAEROBIC Blood Culture adequate volume   Culture   Final    NO GROWTH < 24 HOURS Performed at [facility], [address], [place],  [place] [address]    Report Status PENDING  Incomplete  SARS Coronavirus 2 by RT PCR (hospital order, performed in [facility] hospital lab) Nasopharyngeal Nasopharyngeal Swab     Status: None   Collection Time: 05/04/19  8:30 PM   Specimen: Nasopharyngeal Swab  Result Value Ref Range Status   SARS Coronavirus 2 NEGATIVE NEGATIVE Final    Comment: (NOTE) If result is NEGATIVE SARS-CoV-2 target nucleic acids are NOT DETECTED. The SARS-CoV-2 RNA is generally detectable in upper and lower  respiratory specimens during the acute phase of infection. The lowest  concentration of SARS-CoV-2 viral copies this assay can detect is 250  copies / mL. A negative result does not preclude SARS-CoV-2 infection  and should not be used as the sole basis for treatment or other  patient management decisions.  A negative result may occur with  improper specimen collection / handling, submission of specimen other  than nasopharyngeal swab, presence of viral mutation(s) within the  areas targeted by this assay, and inadequate number of viral copies  (<250 copies / mL). A negative result must be combined with clinical  observations, patient history, and epidemiological information. If result is POSITIVE SARS-CoV-2 target nucleic acids are DETECTED. The SARS-CoV-2 RNA is generally detectable in upper and lower  respiratory specimens dur ing the acute phase of infection.  Positive  results are indicative of active infection with SARS-CoV-2.  Clinical  correlation with patient history and other diagnostic information is  necessary to determine patient infection status.  Positive results do  not rule out bacterial infection or co-infection with other viruses. If result is PRESUMPTIVE POSTIVE SARS-CoV-2 nucleic acids MAY BE PRESENT.   A presumptive positive result was obtained on the submitted specimen  and confirmed on repeat testing.  While 2019 novel coronavirus  (SARS-CoV-2) nucleic acids may be present in the  submitted sample  additional confirmatory testing may be necessary for epidemiological  and / or clinical management purposes  to differentiate between  SARS-CoV-2 and other Sarbecovirus currently known to infect humans.  If clinically indicated additional testing with an alternate test  methodology [phone]) is advised. The SARS-CoV-2 RNA is generally  detectable in upper and lower respiratory sp ecimens during the acute  phase of infection. The expected result is Negative. Fact Sheet for Patients:  BoilerBrush.com.cy Fact Sheet for Healthcare Providers: https://pope.com/ This test is not yet approved or cleared by the [place] FDA and has been authorized for detection and/or diagnosis of SARS-CoV-2 by FDA under  an Emergency Use Authorization (EUA).  This EUA will remain in effect (meaning this test can be used) for the duration of the COVID-19 declaration under Section 564(b)(1) of the Act, 21 U.S.C. section 360bbb-3(b)(1), unless the authorization is terminated or revoked sooner. Performed at [facility] [place] [facility], [address], [place], [place] [address]   Surgical PCR screen     Status: Abnormal   Collection Time: 05/05/19  6:18 AM   Specimen: Nasal Mucosa; Nasal Swab  Result Value Ref Range Status   MRSA, PCR NEGATIVE NEGATIVE Final   Staphylococcus aureus POSITIVE (A) NEGATIVE Final    Comment: (NOTE) The Xpert SA Assay (FDA approved for NASAL specimens in patients 61 years of age and older), is one component of a comprehensive surveillance program. It is not intended to diagnose infection nor to guide or monitor treatment. Performed at [facility], [address], [place], [place] [address]       Studies: Ct Abdomen Pelvis Wo Contrast  Result Date: 05/04/2019 CLINICAL DATA:  Fever and abdominal pain. EXAM: CT ABDOMEN AND PELVIS WITHOUT CONTRAST TECHNIQUE: Multidetector CT imaging of the abdomen and  pelvis was performed following the standard protocol without IV contrast. COMPARISON:  CT 05/13/2018 FINDINGS: Lower chest: Lung bases are clear. Remote bilateral posterior rib fractures. Borderline cardiomegaly. Hepatobiliary: No focal liver abnormality is seen. Status post cholecystectomy. No biliary dilatation. Pancreas: No ductal dilatation or inflammation. Spleen: Normal in size without focal abnormality. Adrenals/Urinary Tract: No adrenal nodule. Moderate right hydroureteronephrosis. Ureter is dilated to the bladder insertion. Possible but not definite punctate distal ureteric stone at the ureterovesicular junction, series 5, image 53. Moderate right perinephric edema. Punctate nonobstructing stones in the mid lower right kidney. Nonobstructing stones in the left kidney. No left hydronephrosis. Trace left perinephric edema. The left ureter is decompressed. No ureteral stone. Urinary bladder is minimally distended. No bladder stone. Stomach/Bowel: Stomach is unremarkable. No small bowel wall thickening or inflammatory change. No obstruction. Normal appendix. Multifocal colonic diverticulosis, most prominent in the sigmoid colon. No acute diverticulitis. Vascular/Lymphatic: Mild aortic atherosclerosis. More advanced bi-iliac and branch atherosclerosis. No aneurysm. No bulky abdominopelvic adenopathy. Reproductive: Post hysterectomy. Ovaries tentatively visualized and quiescent. No suspicious adnexal mass. Other: No free air, free fluid, or intra-abdominal fluid collection. 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 Additional degenerative disc disease at L5-S1. There are no acute or suspicious osseous abnormalities. Scattered calcified gluteal calcifications. Remote bilateral posterior rib fractures. IMPRESSION: 1. Moderate right hydroureteronephrosis, ureter dilated to the bladder insertion. Possible but not definite punctate distal ureteric  stone at the ureterovesicular junction. Right perinephric edema may be obstructive or infectious. 2. Additional nonobstructing bilateral nephrolithiasis. 3. Colonic diverticulosis without acute inflammation. Aortic Atherosclerosis (ICD10-I70.0). Electronically Signed   By: Narda Rutherford M.D.   On: 05/04/2019 20:06   Dg Chest 2 View  Result Date: 05/04/2019 CLINICAL DATA:  53 year old female with sepsis. EXAM: CHEST - 2 VIEW COMPARISON:  None. FINDINGS: The lungs are clear. There is no pleural effusion or pneumothorax. The cardiac silhouette is within normal limits. No acute osseous pathology. Right upper quadrant cholecystectomy clips. IMPRESSION: No active cardiopulmonary disease. Electronically Signed   By: Elgie Collard M.D.   On: 05/04/2019 16:56   Dg Or Urology Cysto Image ([facility] Only)  Result Date: 05/04/2019 There is no interpretation for this exam.  This order is for images obtained during a surgical procedure.  Please See "Surgeries" Tab for more information regarding the procedure.  Ct Image Guided Drainage By Percutaneous Catheter  Result Date: 05/05/2019 INDICATION: Sepsis. Right hydronephrosis and hydroureter. Prior history of pelvic surgery with need of ureteral reimplantation. EXAM: Right percutaneous nephrostomy. COMPARISON:  CT 05/04/2019. MEDICATIONS: Patient on Rocephin.; The antibiotic was administered in an appropriate time frame prior to skin puncture. ANESTHESIA/SEDATION: Fentanyl 50 mcg IV; Versed 3 mg IV Moderate Sedation Time:  40 The patient was continuously monitored during the procedure by the interventional radiology nurse under my direct supervision. CONTRAST:  None-administered into the collecting system(s) FLUOROSCOPY TIME:  Fluoroscopy Time: 0 minutes 0 seconds (0 mGy). COMPLICATIONS: None immediate. PROCEDURE: Informed written consent was obtained via an interpreter from the patient after a thorough discussion of the procedural risks, benefits and alternatives.  All questions were addressed. A time-out was called prior to the procedure. The back was sterilely prepped and draped. This procedure had to be performed in CT as the interventional suite was down for maintenance following local anesthesia with 1% lidocaine 22 gauge needle was advanced into a posterior right renal calyx. This followed by placement of an 018 wire. This is followed by placement of a guidewire adduction catheter. This is followed by guidewire exchange for a 035 Amplatz extra stiff wire. This is followed by placement of an 8 French nephrostomy catheter. Confirmation of good placement with obtained with administration of small amount of dilute nonionic contrast in the renal collecting system. The catheter was sutured to the skin and placed to bag drainage. Urine sample sent to pathology. IMPRESSION: Successful right percutaneous nephrostomy. Electronically Signed   By: Marcello Moores  Register   On: 05/05/2019 11:25    Scheduled Meds:  [START ON 05/07/2019] amLODipine  10 mg Oral Daily   [START ON 05/07/2019] atenolol  50 mg Oral Daily   atorvastatin  20 mg Oral Daily   azaTHIOprine  50 mg Oral Daily   Chlorhexidine Gluconate Cloth  6 each Topical Daily   enoxaparin (LOVENOX) injection  40 mg Subcutaneous Q24H   escitalopram  10 mg Oral Daily   folic acid  1 mg Oral Daily   hydroxychloroquine  200 mg Oral QODAY   mupirocin ointment  1 application Nasal BID    Continuous Infusions:  sodium chloride 100 mL/hr at 05/05/19 0144   cefTRIAXone (ROCEPHIN)  IV Stopped (05/04/19 2135)   lactated ringers Stopped (05/05/19 0140)     LOS: 1 day     Kayleen Memos, MD [facility] Pager [phone]  If 7PM-7AM, please contact night-coverage [email or web address] Password [email or web address] 05/05/2019, 2:58 PM

## 2019-05-05 NOTE — Progress Notes (Signed)
Pt. Stable after PCN.Vss.Abd benign.F/U with pt's MD.Thankyou.

## 2019-05-05 NOTE — Progress Notes (Signed)
Md notified: The patient is back in the room after a placement of nephrostomy tube to the R side. Do you want to place her on a diet.

## 2019-05-05 NOTE — Anesthesia Postprocedure Evaluation (Signed)
Anesthesia Post Note  Patient: Lindsay Dean  Procedure(s) Performed: CYSTOSCOPY with attempt of stent placement (Right )  Patient location during evaluation: PACU Anesthesia Type: General Level of consciousness: awake and alert Pain management: pain level controlled Vital Signs Assessment: post-procedure vital signs reviewed and stable Respiratory status: spontaneous breathing, nonlabored ventilation and respiratory function stable Cardiovascular status: blood pressure returned to baseline and stable Postop Assessment: no apparent nausea or vomiting Anesthetic complications: no     Last Vitals:  Vitals:   05/05/19 0115 05/05/19 0120  BP: 111/74 108/73  Pulse: 94 92  Resp: 20   Temp: (!) 38.3 C   SpO2: 97% 98%    Last Pain:  Vitals:   05/05/19 0128  TempSrc:   PainSc: 0-No pain                 Durenda Hurt

## 2019-05-05 NOTE — Progress Notes (Signed)
   Patient with an infected obstructed right collecting system who presented with fever of 103 and moderate right hydroureteronephrosis down to the bladder with a possible punctate stone versus stricture of her prior ureteral re-implant site.  She underwent successful right nephrostomy tube placement for drainage today, and continues on broad-spectrum antibiotics.  I had a very long conversation this afternoon via translator with the patient explaining her situation. We discussed the need for follow-up with [facility] urology/IR for antegrade nephrostogram on the right side to evaluate for punctate distal ureteral stone passage versus stricture of her prior right-sided ureteral re-implant.   Billey Co, MD

## 2019-05-05 NOTE — Progress Notes (Signed)
Pt in for R PCN for R hydronephrosis post pevic /ureteral surgery.NKA to contrast .VSS.HENT ok.Chest clear.Abd benign.Consent obtained.

## 2019-05-05 NOTE — Progress Notes (Signed)
PHARMACY - PHYSICIAN COMMUNICATION CRITICAL VALUE ALERT - BLOOD CULTURE IDENTIFICATION (BCID)  Lindsay Dean is an 53 y.o. female who presented to [facility] on 05/04/2019  Assessment:  1/4 bottles E.coli, KPC negative  Name of physician (or Provider) Contacted: Dr. Nevada Crane  Current antibiotics: Rocephin 1 g  Changes to prescribed antibiotics recommended: meropenem 1 g IV q8h pending sensitivities  Results for orders placed or performed during the hospital encounter of 05/04/19  Blood Culture ID Panel (Reflexed) (Collected: 05/04/2019  3:51 PM)  Result Value Ref Range   Enterococcus species NOT DETECTED NOT DETECTED   Listeria monocytogenes NOT DETECTED NOT DETECTED   Staphylococcus species NOT DETECTED NOT DETECTED   Staphylococcus aureus (BCID) NOT DETECTED NOT DETECTED   Streptococcus species NOT DETECTED NOT DETECTED   Streptococcus agalactiae NOT DETECTED NOT DETECTED   Streptococcus pneumoniae NOT DETECTED NOT DETECTED   Streptococcus pyogenes NOT DETECTED NOT DETECTED   Acinetobacter baumannii NOT DETECTED NOT DETECTED   Enterobacteriaceae species DETECTED (A) NOT DETECTED   Enterobacter cloacae complex NOT DETECTED NOT DETECTED   Escherichia coli DETECTED (A) NOT DETECTED   Klebsiella oxytoca NOT DETECTED NOT DETECTED   Klebsiella pneumoniae NOT DETECTED NOT DETECTED   Proteus species NOT DETECTED NOT DETECTED   Serratia marcescens NOT DETECTED NOT DETECTED   Carbapenem resistance NOT DETECTED NOT DETECTED   Haemophilus influenzae NOT DETECTED NOT DETECTED   Neisseria meningitidis NOT DETECTED NOT DETECTED   Pseudomonas aeruginosa NOT DETECTED NOT DETECTED   Candida albicans NOT DETECTED NOT DETECTED   Candida glabrata NOT DETECTED NOT DETECTED   Candida krusei NOT DETECTED NOT DETECTED   Candida parapsilosis NOT DETECTED NOT DETECTED   Candida tropicalis NOT DETECTED NOT DETECTED    Tawnya Crook, PharmD 05/05/2019  4:33 PM

## 2019-05-05 NOTE — Progress Notes (Signed)
Patient clinically stable post Nephrostomy tube placement per Dr Register. Denies complaints at this time. Neph tube draining pink tinged urine. Received Versed 3mg  along with Fentanyl 12mcg IV for procedure. Vitals remained stable throughout procedure. Report called to Cory Roughen RN with questions answered.

## 2019-05-05 NOTE — Op Note (Signed)
Date of procedure: 05/05/19  Preoperative diagnosis:  1. Right hydroureteronephrosis and UTI, sepsis  Postoperative diagnosis:  1. Same  Procedure: 1. Cystoscopy, attempted right ureteral stent placement  Surgeon: Nickolas Madrid, MD  Anesthesia: General  Complications: None  Intraoperative findings:  1.  Left ureteral orifices orthotopic and effluxing clear yellow urine.  Unable to find any other semblance of right-sided ureter from prior reimplant, no other effluxing area of the bladder  EBL: None  Specimens: None  Drains: None  Indication: Lindsay Dean is a 53 y.o. patient who presented with right-sided hydroureteronephrosis down to the bladder with a possible punctate stone versus scarring of her prior reimplant site, UTI, and fever to 103 with stable vital signs.  She has a complex urologic history with a cystotomy and right ureteral reimplant with urogynecology in urology at [facility] in December 2019 suffered during a vaginal hysterectomy and vaginal vault suspension.  After reviewing the management options for treatment, they elected to proceed with the above surgical procedure(s). We have discussed the potential benefits and risks of the procedure, side effects of the proposed treatment, the likelihood of the patient achieving the goals of the procedure, and any potential problems that might occur during the procedure or recuperation. Informed consent has been obtained.  Description of procedure:  The patient was taken to the operating room and general anesthesia was induced. SCDs were placed for DVT prophylaxis. The patient was placed in the dorsal lithotomy position, prepped and draped in the usual sterile fashion, and preoperative antibiotics(ceftriaxone) were administered. A preoperative time-out was performed.   A 21 French rigid cystoscope was used to intubate the meatus.  Thorough cystoscopy was performed.  The left ureteral orifice was orthotopic and effluxing yellow  urine.  There was some folded abnormal mucosa at the location of the expected right ureteral orifice, but I was unable to identify any orifice or cannulate any semblance of the ureter.  This was consistent with her prior cystotomy and reimplant.  Thorough cystoscopy was performed and showed no other possible sites of the prior right-sided ureteral reimplant.  There was no efflux anywhere in the bladder except from the left-sided ureteral orifice.  I also perform cystoscopy with a flexible cystoscope and retroflexion, but no possible right-sided orifice was identified.  After 30 minutes of attempting to identify right-sided ureteral orifice, the case was terminated with plan for nephrostomy tube on the right side.  Disposition: Stable to PACU  Plan: She will need right-sided nephrostomy tube with interventional radiology Recommend antibiotics and sepsis resuscitation acutely In follow-up, recommend antegrade nephrostogram to evaluate for distal ureteral stone versus stricture of reimplant site  Nickolas Madrid, MD

## 2019-05-05 NOTE — Progress Notes (Signed)
The nephrostomy tube culture obtained by PACU nurse.

## 2019-05-06 LAB — CBC WITH DIFFERENTIAL/PLATELET
Abs Immature Granulocytes: 0.08 10*3/uL — ABNORMAL HIGH (ref 0.00–0.07)
Basophils Absolute: 0 10*3/uL (ref 0.0–0.1)
Basophils Relative: 0 %
Eosinophils Absolute: 0 10*3/uL (ref 0.0–0.5)
Eosinophils Relative: 0 %
HCT: 27.7 % — ABNORMAL LOW (ref 36.0–46.0)
Hemoglobin: 9.2 g/dL — ABNORMAL LOW (ref 12.0–15.0)
Immature Granulocytes: 1 %
Lymphocytes Relative: 14 %
Lymphs Abs: 1.4 10*3/uL (ref 0.7–4.0)
MCH: 28.7 pg (ref 26.0–34.0)
MCHC: 33.2 g/dL (ref 30.0–36.0)
MCV: 86.3 fL (ref 80.0–100.0)
Monocytes Absolute: 0.7 10*3/uL (ref 0.1–1.0)
Monocytes Relative: 7 %
Neutro Abs: 7.8 10*3/uL — ABNORMAL HIGH (ref 1.7–7.7)
Neutrophils Relative %: 78 %
Platelets: 161 10*3/uL (ref 150–400)
RBC: 3.21 MIL/uL — ABNORMAL LOW (ref 3.87–5.11)
RDW: 13.8 % (ref 11.5–15.5)
WBC: 10 10*3/uL (ref 4.0–10.5)
nRBC: 0 % (ref 0.0–0.2)

## 2019-05-06 LAB — MAGNESIUM: Magnesium: 2 mg/dL (ref 1.7–2.4)

## 2019-05-06 LAB — COMPREHENSIVE METABOLIC PANEL
ALT: 15 U/L (ref 0–44)
AST: 16 U/L (ref 15–41)
Albumin: 2.6 g/dL — ABNORMAL LOW (ref 3.5–5.0)
Alkaline Phosphatase: 65 U/L (ref 38–126)
Anion gap: 5 (ref 5–15)
BUN: 29 mg/dL — ABNORMAL HIGH (ref 6–20)
CO2: 19 mmol/L — ABNORMAL LOW (ref 22–32)
Calcium: 7.9 mg/dL — ABNORMAL LOW (ref 8.9–10.3)
Chloride: 114 mmol/L — ABNORMAL HIGH (ref 98–111)
Creatinine, Ser: 0.73 mg/dL (ref 0.44–1.00)
GFR calc Af Amer: >60 mL/min (ref >60)
GFR calc non Af Amer: >60 mL/min (ref >60)
Glucose, Bld: 112 mg/dL — ABNORMAL HIGH (ref 70–99)
Potassium: 3.6 mmol/L (ref 3.5–5.1)
Sodium: 138 mmol/L (ref 135–145)
Total Bilirubin: 0.2 mg/dL — ABNORMAL LOW (ref 0.3–1.2)
Total Protein: 7.1 g/dL (ref 6.5–8.1)

## 2019-05-06 LAB — URINE CULTURE: Culture: NO GROWTH

## 2019-05-06 MED ORDER — OXYCODONE HCL 5 MG PO TABS
5.0000 mg | ORAL_TABLET | Freq: Four times a day (QID) | ORAL | Status: DC | PRN
  Administered 2019-05-06 – 2019-05-08 (×4): 5 mg via ORAL
  Filled 2019-05-06 (×5): qty 1

## 2019-05-06 MED ORDER — SENNA 8.6 MG PO TABS
2.0000 | ORAL_TABLET | Freq: Every day | ORAL | Status: DC
  Administered 2019-05-06 – 2019-05-07 (×2): 17.2 mg via ORAL
  Filled 2019-05-06 (×3): qty 2

## 2019-05-06 MED ORDER — TRAMADOL HCL 50 MG PO TABS: 50.0000 mg | ORAL_TABLET | Freq: Two times a day (BID) | ORAL | Status: DC | PRN

## 2019-05-06 MED ORDER — ALUM & MAG HYDROXIDE-SIMETH 200-200-20 MG/5ML PO SUSP
15.0000 mL | ORAL | Status: DC | PRN
  Administered 2019-05-06: 15 mL via ORAL
  Filled 2019-05-06: qty 30

## 2019-05-06 NOTE — Progress Notes (Signed)
PROGRESS NOTE  Lindsay Dean ZOX:[id number] DOB: 1965-08-12 DOA: 05/04/2019 PCP: [name], MD  HPI/Recap of past 24 hours: Lindsay Dean  is a 53 y.o. female with a known history of type 2 diabetes mellitus, hypertension and systemic lupus erythematosus, presented to the emergency room with a consult of fever and chills today with associated nausea without vomiting.  She has been having bilateral flank pain.  She admitted to urinary frequency and urgency with associated dysuria without hematuria.  No cough or wheezing or shortness of breath.  No chest pain or palpitations.  Upon presentation to the emergency room, temperature was 103.1, heart rate 85 with a blood pressure of 118/98 respiratory to 28 and pulse symmetry of 99% on room air.  Labs revealed borderline potassium of 3.5 and lactic acid level of 0.9 twice.  Creatinine was 1.12 up from 0.82 by a year ago.  CBC showed mild anemia with hemoglobin of 11.5 hematocrit 35.4 close to previous levels.  Urinalysis was strongly positive for UTI with many bacteria and more than 50 WBCs.  Blood culture as well as urine culture was sent.  Two-view chest x-ray showed no acute cardiopulmonary disease.  EKG showed no sinus rhythm with a rate of 82 with minimal voltage criteria for LVH. Abdominal pelvic CT scan showed moderate right hydroureteral nephrosis with the ureter dilated to the bladder insertion.  There is possible but not definite punctate distal ureteric stone at the UV junction.  Right perinephric edema may be obstructing or infectious.  There was additional nonobstructing bilateral nephrolithiasis and colonic diverticulosis without diverticulitis.  She had a pelvic reconstructive surgery at [facility] in September of this year and has been followed by Dr. Karen Chafe with urogynecology.  The patient was given 1 g of IV Rocephin and 650 mg p.o. Tylenol.  On-call urologist Dr. Diamantina Providence was notified.  She will be admitted to  medically monitored bed for further evaluation and management.  Post cystoscopy on 05/05/19 by Dr. Diamantina Providence, urology, unable to pass R ureteral stent. Post R percutaneous nephrostomy by IR.  05/06/19: Patient was seen and examined at her bedside this morning.  No acute events overnight.  She continues to have intermittent right flank pain.  Blood culture positive for E. coli 1 out of 2 bottles.  On meropenem empirically.     Assessment/Plan: Active Problems:   Sepsis ([place])  Sepsis secondary to right pyelonephritis and UTI in the setting of infected obstructed right collecting system post R PCN by IR on 05/05/19 Presented with fever with T-max 103.1 and leukocytosis CT scan suggestive of right pyelonephritis Started on IV antibiotics empirically, continue on meropenem until ID and sensitivities result Post right ureteral stent placement 05/05/19 by urology. Continue IV fluid hydration Continue to monitor fever curve and WBC Continue to monitor urine output Urine culture 20,000 colonies of gram-negative rods Afebrile and leukocytosis resolved  Resolved AKI likely prerenal in the setting of dehydration Baseline creatinine 0.7 with GFR greater than 60 Presented with creatinine of 1.12 with GFR of 57 Continue IV fluid hydration Continue to avoid nephrotoxins like NSAIDs Continue to monitor urine output Repeat BMP in the morning  Hypertension.   Continue home antihypertensive medications  Blood pressure is currently at goal  Continue to monitor vital signs   Dyslipidemia.    Continue home medications  Systemic lupus erythematosus.    Continue her Plaquenil and Imuran.  Overactive bladder.    Continue Ditropan XL and Myrbetriq   DVT prophylaxis.  Subcutaneous Lovenox daily  Disposition: Patient is currently not appropriate for discharge due to ongoing treatment for sepsis secondary to UTI and right pyelonephritis.  Patient will require at least 2 midnights for further  evaluation and treatment of present condition.  CODE STATUS: Full code    Objective: Vitals:   05/05/19 1450 05/05/19 2020 05/06/19 0454 05/06/19 1128  BP: 122/69 113/65 113/73 120/69  Pulse: 74 72 62 68  Resp: 14 18 16 17   Temp: 98.2 F (36.8 C) 98.2 F (36.8 C) 97.8 F (36.6 C) 98.5 F (36.9 C)  TempSrc: Oral Oral Oral Oral  SpO2: 100% 98% 97% 99%  Weight:      Height:        Intake/Output Summary (Last 24 hours) at 05/06/2019 1404 Last data filed at 05/06/2019 1147 Gross per 24 hour  Intake 1364.3 ml  Output 1595 ml  Net -230.7 ml   Filed Weights   05/04/19 1536  Weight: 71.2 kg    Exam:  . General: 53 y.o. year-old female well-developed well-nourished no acute distress.  Alert and oriented x4. . Cardiovascular: Regular rate and rhythm no rubs or gallops. 44 Respiratory: Clear to percussion no wheezes or rales. . Abdomen: Soft nontender nondistended normal bowel sounds.  Musculoskeletal: No lower extremity edema.   Marland Kitchen Psychiatry: Mood is appropriate for condition and setting.   Data Reviewed: CBC: Recent Labs  Lab 05/04/19 1551 05/05/19 0454 05/06/19 0714  WBC 9.5 13.5* 10.0  NEUTROABS 8.1*  --  7.8*  HGB 11.5* 10.0* 9.2*  HCT 35.4* 31.2* 27.7*  MCV 88.7 89.7 86.3  PLT 213 177 161   Basic Metabolic Panel: Recent Labs  Lab 05/04/19 1551 05/05/19 0454 05/06/19 0714  NA 138 140 138  K 3.5 3.6 3.6  CL 108 112* 114*  CO2 19* 18* 19*  GLUCOSE 100* 157* 112*  BUN 27* 30* 29*  CREATININE 1.12* 1.09* 0.73  CALCIUM 9.4 8.3* 7.9*  MG  --   --  2.0   GFR: Estimated Creatinine Clearance: 68.5 mL/min (by C-G formula based on SCr of 0.73 mg/dL). Liver Function Tests: Recent Labs  Lab 05/04/19 1551 05/06/19 0714  AST 25 16  ALT 19 15  ALKPHOS 97 65  BILITOT 0.5 0.2*  PROT 9.2* 7.1  ALBUMIN 3.7 2.6*   No results for input(s): LIPASE, AMYLASE in the last 168 hours. No results for input(s): AMMONIA in the last 168 hours. Coagulation Profile:  Recent Labs  Lab 05/04/19 1551  INR 1.1   Cardiac Enzymes: No results for input(s): CKTOTAL, CKMB, CKMBINDEX, TROPONINI in the last 168 hours. BNP (last 3 results) No results for input(s): PROBNP in the last 8760 hours. HbA1C: No results for input(s): HGBA1C in the last 72 hours. CBG: Recent Labs  Lab 05/04/19 1543 05/05/19 0022  GLUCAP 87 111*   Lipid Profile: No results for input(s): CHOL, HDL, LDLCALC, TRIG, CHOLHDL, LDLDIRECT in the last 72 hours. Thyroid Function Tests: No results for input(s): TSH, T4TOTAL, FREET4, T3FREE, THYROIDAB in the last 72 hours. Anemia Panel: No results for input(s): VITAMINB12, FOLATE, FERRITIN, TIBC, IRON, RETICCTPCT in the last 72 hours. Urine analysis:    Component Value Date/Time   COLORURINE YELLOW (A) 05/05/2019 0841   APPEARANCEUR HAZY (A) 05/05/2019 0841   LABSPEC 1.015 05/05/2019 0841   PHURINE 6.0 05/05/2019 0841   GLUCOSEU NEGATIVE 05/05/2019 0841   HGBUR MODERATE (A) 05/05/2019 0841   BILIRUBINUR NEGATIVE 05/05/2019 0841   KETONESUR NEGATIVE 05/05/2019 0841   PROTEINUR 30 (  A) 05/05/2019 0841   NITRITE NEGATIVE 05/05/2019 0841   LEUKOCYTESUR MODERATE (A) 05/05/2019 0841   Sepsis Labs: @LABRCNTIP (procalcitonin:4,lacticidven:4)  ) Recent Results (from the past 240 hour(s))  Culture, blood (Routine x 2)     Status: Abnormal (Preliminary result)   Collection Time: 05/04/19  3:51 PM   Specimen: BLOOD  Result Value Ref Range Status   Specimen Description   Final    BLOOD RIGHT ANTECUBITAL Performed at [facility], [address] [place], [place] [address]    Special Requests   Final    BOTTLES DRAWN AEROBIC AND ANAEROBIC Blood Culture results may not be optimal due to an excessive volume of blood received in culture bottles Performed at [facility], [address] [place], [place] [address]    Culture  Setup Time   Final    GRAM NEGATIVE RODS AEROBIC BOTTLE ONLY CRITICAL RESULT CALLED TO, READ  BACK BY AND VERIFIED WITH: ABBY ARRINGTON 05/05/19 @ 1613  MLK Performed at [facility], [address] [place], [place] [address]    Culture ESCHERICHIA COLI (A)  Final   Report Status PENDING  Incomplete  Urine Culture     Status: Abnormal (Preliminary result)   Collection Time: 05/04/19  3:51 PM   Specimen: Urine, Clean Catch  Result Value Ref Range Status   Specimen Description   Final    URINE, CLEAN CATCH Performed at [facility], [address] [place], [place] [address]    Special Requests   Final    Normal Performed at [facility], [address] [place], [place] [address]    Culture (A)  Final    >=100,000 COLONIES/mL GRAM NEGATIVE RODS CULTURE REINCUBATED FOR BETTER GROWTH Performed at [facility], [address] [place], [place] [address]    Report Status PENDING  Incomplete  Blood Culture ID Panel (Reflexed)     Status: Abnormal   Collection Time: 05/04/19  3:51 PM  Result Value Ref Range Status   Enterococcus species NOT DETECTED NOT DETECTED Final   Listeria monocytogenes NOT DETECTED NOT DETECTED Final   Staphylococcus species NOT DETECTED NOT DETECTED Final   Staphylococcus aureus (BCID) NOT DETECTED NOT DETECTED Final   Streptococcus species NOT DETECTED NOT DETECTED Final   Streptococcus agalactiae NOT DETECTED NOT DETECTED Final   Streptococcus pneumoniae NOT DETECTED NOT DETECTED Final   Streptococcus pyogenes NOT DETECTED NOT DETECTED Final   Acinetobacter baumannii NOT DETECTED NOT DETECTED Final   Enterobacteriaceae species DETECTED (A) NOT DETECTED Final    Comment: Enterobacteriaceae represent a large family of gram-negative bacteria, not a single organism. CRITICAL RESULT CALLED TO, READ BACK BY AND VERIFIED WITH: ABBY ARRINGTON 05/05/19 @ 1613  MLK    Enterobacter cloacae complex NOT DETECTED NOT DETECTED Final   Escherichia coli DETECTED (A) NOT DETECTED Final    Comment: CRITICAL RESULT CALLED TO, READ  BACK BY AND VERIFIED WITH: ABBY ARRINGTON 05/05/19 @ 1613  MLK    Klebsiella oxytoca NOT DETECTED NOT DETECTED Final   Klebsiella pneumoniae NOT DETECTED NOT DETECTED Final   Proteus species NOT DETECTED NOT DETECTED Final   Serratia marcescens NOT DETECTED NOT DETECTED Final   Carbapenem resistance NOT DETECTED NOT DETECTED Final   Haemophilus influenzae NOT DETECTED NOT DETECTED Final   Neisseria meningitidis NOT DETECTED NOT DETECTED Final   Pseudomonas aeruginosa NOT DETECTED NOT DETECTED Final   Candida albicans NOT DETECTED NOT DETECTED Final   Candida glabrata NOT DETECTED  NOT DETECTED Final   Candida krusei NOT DETECTED NOT DETECTED Final   Candida parapsilosis NOT DETECTED NOT DETECTED Final   Candida tropicalis NOT DETECTED NOT DETECTED Final    Comment: Performed at [facility], [address] [place], [place] [address]  Culture, blood (Routine x 2)     Status: None (Preliminary result)   Collection Time: 05/04/19  3:54 PM   Specimen: BLOOD  Result Value Ref Range Status   Specimen Description BLOOD LEFT ANTECUBITAL  Final   Special Requests   Final    BOTTLES DRAWN AEROBIC AND ANAEROBIC Blood Culture adequate volume   Culture   Final    NO GROWTH 2 DAYS Performed at [facility], [address], [place], [place] [address]    Report Status PENDING  Incomplete  SARS Coronavirus 2 by RT PCR (hospital order, performed in [facility] hospital lab) Nasopharyngeal Nasopharyngeal Swab     Status: None   Collection Time: 05/04/19  8:30 PM   Specimen: Nasopharyngeal Swab  Result Value Ref Range Status   SARS Coronavirus 2 NEGATIVE NEGATIVE Final    Comment: (NOTE) If result is NEGATIVE SARS-CoV-2 target nucleic acids are NOT DETECTED. The SARS-CoV-2 RNA is generally detectable in upper and lower  respiratory specimens during the acute phase of infection. The lowest  concentration of SARS-CoV-2 viral copies this assay can detect is 250  copies / mL. A  negative result does not preclude SARS-CoV-2 infection  and should not be used as the sole basis for treatment or other  patient management decisions.  A negative result may occur with  improper specimen collection / handling, submission of specimen other  than nasopharyngeal swab, presence of viral mutation(s) within the  areas targeted by this assay, and inadequate number of viral copies  (<250 copies / mL). A negative result must be combined with clinical  observations, patient history, and epidemiological information. If result is POSITIVE SARS-CoV-2 target nucleic acids are DETECTED. The SARS-CoV-2 RNA is generally detectable in upper and lower  respiratory specimens dur ing the acute phase of infection.  Positive  results are indicative of active infection with SARS-CoV-2.  Clinical  correlation with patient history and other diagnostic information is  necessary to determine patient infection status.  Positive results do  not rule out bacterial infection or co-infection with other viruses. If result is PRESUMPTIVE POSTIVE SARS-CoV-2 nucleic acids MAY BE PRESENT.   A presumptive positive result was obtained on the submitted specimen  and confirmed on repeat testing.  While 2019 novel coronavirus  (SARS-CoV-2) nucleic acids may be present in the submitted sample  additional confirmatory testing may be necessary for epidemiological  and / or clinical management purposes  to differentiate between  SARS-CoV-2 and other Sarbecovirus currently known to infect humans.  If clinically indicated additional testing with an alternate test  methodology [id number]) is advised. The SARS-CoV-2 RNA is generally  detectable in upper and lower respiratory sp ecimens during the acute  phase of infection. The expected result is Negative. Fact Sheet for Patients:  BoilerBrush.com.cy Fact Sheet for Healthcare Providers: https://pope.com/ This test is not  yet approved or cleared by the [place] FDA and has been authorized for detection and/or diagnosis of SARS-CoV-2 by FDA under an Emergency Use Authorization (EUA).  This EUA will remain in effect (meaning this test can be used) for the duration of the COVID-19 declaration under Section 564(b)(1) of the Act, 21 U.S.C. section 360bbb-3(b)(1), unless the authorization is terminated or  revoked sooner. Performed at [facility], [address], [place], [place] [address]   Surgical PCR screen     Status: Abnormal   Collection Time: 05/05/19  6:18 AM   Specimen: Nasal Mucosa; Nasal Swab  Result Value Ref Range Status   MRSA, PCR NEGATIVE NEGATIVE Final   Staphylococcus aureus POSITIVE (A) NEGATIVE Final    Comment: (NOTE) The Xpert SA Assay (FDA approved for NASAL specimens in patients 56 years of age and older), is one component of a comprehensive surveillance program. It is not intended to diagnose infection nor to guide or monitor treatment. Performed at [facility], [address], [place], [place] [address]   Urine culture     Status: None   Collection Time: 05/05/19  8:37 AM   Specimen: In/Out Cath Urine  Result Value Ref Range Status   Specimen Description   Final    IN/OUT CATH URINE Performed at [facility], [address] [place], [place] [address]    Special Requests   Final    NONE Performed at [facility], [address] [place], [place] [address]    Culture   Final    NO GROWTH Performed at [facility], [address], [place], [place] [address]    Report Status 05/06/2019 FINAL  Final  Urine culture     Status: Abnormal (Preliminary result)   Collection Time: 05/05/19 10:25 AM   Specimen: Urine, Random  Result Value Ref Range Status   Specimen Description   Final    URINE, RANDOM Performed at [facility], [address], [place], [place] [address]    Special Requests   Final    SYRINGE  NEPHROSTOMY TUBE Performed at [facility], [address] [place], [place] [address]    Culture 20,000 COLONIES/mL GRAM NEGATIVE RODS (A)  Final   Report Status PENDING  Incomplete      Studies: No results found.  Scheduled Meds: . [START ON 05/07/2019] amLODipine  10 mg Oral Daily  . [START ON 05/07/2019] atenolol  50 mg Oral Daily  . atorvastatin  20 mg Oral Daily  . azaTHIOprine  50 mg Oral Daily  . Chlorhexidine Gluconate Cloth  6 each Topical Daily  . enoxaparin (LOVENOX) injection  40 mg Subcutaneous Q24H  . escitalopram  10 mg Oral Daily  . folic acid  1 mg Oral Daily  . hydroxychloroquine  200 mg Oral QODAY  . mupirocin ointment  1 application Nasal BID  . senna  2 tablet Oral QHS    Continuous Infusions: . sodium chloride 100 mL/hr at 05/06/19 0837  . lactated ringers Stopped (05/05/19 0140)  . meropenem (MERREM) IV 1 g (05/06/19 0604)     LOS: 2 days     Darlin Drop, MD [facility] Pager [phone]  If 7PM-7AM, please contact night-coverage [email or web address] Password [email or web address] 05/06/2019, 2:04 PM

## 2019-05-07 LAB — URINE CULTURE
Culture: >=100000 — AB
Culture: 20000 — AB
Special Requests: NORMAL

## 2019-05-07 NOTE — Progress Notes (Signed)
PROGRESS NOTE  Lindsay Dean RUE:[id number] DOB: 1965-08-11 DOA: 05/04/2019 PCP: Preston Fleeting, MD  HPI/Recap of past 24 hours: Lindsay Dean  is a 53 y.o. female with a known history of type 2 diabetes mellitus, hypertension and systemic lupus erythematosus, presented to the emergency room with a consult of fever and chills today with associated nausea without vomiting.  She has been having bilateral flank pain.  She admitted to urinary frequency and urgency with associated dysuria without hematuria.  No cough or wheezing or shortness of breath.  No chest pain or palpitations.  Upon presentation to the emergency room, temperature was 103.1, heart rate 85 with a blood pressure of 118/98 respiratory to 28 and pulse symmetry of 99% on room air.  Labs revealed borderline potassium of 3.5 and lactic acid level of 0.9 twice.  Creatinine was 1.12 up from 0.82 by a year ago.  CBC showed mild anemia with hemoglobin of 11.5 hematocrit 35.4 close to previous levels.  Urinalysis was strongly positive for UTI with many bacteria and more than 50 WBCs.  Blood culture as well as urine culture was sent.  Two-view chest x-ray showed no acute cardiopulmonary disease.  EKG showed no sinus rhythm with a rate of 82 with minimal voltage criteria for LVH. Abdominal pelvic CT scan showed moderate right hydroureteral nephrosis with the ureter dilated to the bladder insertion.  There is possible but not definite punctate distal ureteric stone at the UV junction.  Right perinephric edema may be obstructing or infectious.  There was additional nonobstructing bilateral nephrolithiasis and colonic diverticulosis without diverticulitis.  She had a pelvic reconstructive surgery at [facility] in September of this year and has been followed by Dr. Lauris Chroman with urogynecology.  The patient was given 1 g of IV Rocephin and 650 mg p.o. Tylenol.  On-call urologist Dr. Richardo Hanks was notified.  She will be admitted to  medically monitored bed for further evaluation and management.  Post cystoscopy on 05/05/19 by Dr. Richardo Hanks, urology, unable to pass R ureteral stent. Post R percutaneous nephrostomy by IR.  05/06/19: Patient was seen and examined at her bedside this morning.  No acute events overnight.  She continues to have intermittent right flank pain.  Blood culture positive for E. coli 1 out of 2 bottles.  On meropenem empirically.    05/07/19: Seen and examined at her bedside this am. No acute events overnight. She has no new complaints. Awaiting sensitivities for positive e-coli blood cxs.   Assessment/Plan: Active Problems:   Sepsis (HCC)  Sepsis secondary to right pyelonephritis and UTI in the setting of infected obstructed right collecting system post R PCN by IR on 05/05/19 Presented with fever with T-max 103.1 and leukocytosis CT scan suggestive of right pyelonephritis Continue on meropenem until ID and sensitivities result Post right ureteral stent placement 05/05/19 by urology. Urine culture taken on 05/04/2019 + for greater than 100,000 E. coli with resistance to ampicillin, Unasyn and Bactrim Blood cultures taken on 05/04/2019 + for E. coli, awaiting sensitivities Sepsis physiology is resolving Continue IV fluid hydration and monitoring of renal function. Continue to monitor output from right percutaneous nephrostomy. Continue to monitor fever curve and WBC Obtain labs in the morning  Non anion gap metabolic acidosis likely in the setting of sepsis Serum bicarb 19 on 05/06/2019 with anion gap of 5 Give sodium bicarb p.o. 1300 mg twice daily x1 day. Repeat BMP in the morning  Resolved AKI likely prerenal in the setting of dehydration Baseline  creatinine 0.7 with GFR greater than 60 Presented with creatinine of 1.12 with GFR of 57 Appears to be back to her baseline creatinine Continue IV fluid hydration Continue to avoid nephrotoxins like NSAIDs Continue to monitor urine output  Repeat BMP in the morning  Hypertension.   Blood pressure is at goal Continue home antihypertensive medications  Blood pressure is currently at goal  Continue to monitor vital signs   Dyslipidemia.    Continue home medications  Systemic lupus erythematosus.    Continue her Plaquenil and Imuran.  Overactive bladder.    Continue Ditropan XL and Myrbetriq   DVT prophylaxis.  Subcutaneous Lovenox daily  Disposition: Possible discharge to home with home health services in the next 24 to 48 hours pending sensitivities of blood cultures.  CODE STATUS: Full code    Objective: Vitals:   05/06/19 1128 05/06/19 2200 05/07/19 0500 05/07/19 1254  BP: 120/69 (!) 143/62 (!) 147/81 123/72  Pulse: 68 67 64 66  Resp: 17 20 16 18   Temp: 98.5 F (36.9 C) 98.4 F (36.9 C) 98.7 F (37.1 C) 98 F (36.7 C)  TempSrc: Oral Oral Oral Oral  SpO2: 99% 99% 98% 96%  Weight:      Height:        Intake/Output Summary (Last 24 hours) at 05/07/2019 1356 Last data filed at 05/07/2019 1303 Gross per 24 hour  Intake -  Output 2850 ml  Net -2850 ml   Filed Weights   05/04/19 1536  Weight: 71.2 kg    Exam:  . General: 53 y.o. year-old female well-developed well-nourished in no acute distress.  Alert and oriented x4. . Cardiovascular: Regular rate and rhythm no rubs or gallops. Marland Kitchen. Respiratory: Clear to station no wheezes or rales.  Abdomen: Soft nontender nondistended with bowel sounds present..  Musculoskeletal: No lower extremity edema.   Marland Kitchen. Psychiatry: Mood is appropriate for condition and setting.   Data Reviewed: CBC: Recent Labs  Lab 05/04/19 1551 05/05/19 0454 05/06/19 0714  WBC 9.5 13.5* 10.0  NEUTROABS 8.1*  --  7.8*  HGB 11.5* 10.0* 9.2*  HCT 35.4* 31.2* 27.7*  MCV 88.7 89.7 86.3  PLT 213 177 161   Basic Metabolic Panel: Recent Labs  Lab 05/04/19 1551 05/05/19 0454 05/06/19 0714  NA 138 140 138  K 3.5 3.6 3.6  CL 108 112* 114*  CO2 19* 18* 19*  GLUCOSE 100*  157* 112*  BUN 27* 30* 29*  CREATININE 1.12* 1.09* 0.73  CALCIUM 9.4 8.3* 7.9*  MG  --   --  2.0   GFR: Estimated Creatinine Clearance: 68.5 mL/min (by C-G formula based on SCr of 0.73 mg/dL). Liver Function Tests: Recent Labs  Lab 05/04/19 1551 05/06/19 0714  AST 25 16  ALT 19 15  ALKPHOS 97 65  BILITOT 0.5 0.2*  PROT 9.2* 7.1  ALBUMIN 3.7 2.6*   No results for input(s): LIPASE, AMYLASE in the last 168 hours. No results for input(s): AMMONIA in the last 168 hours. Coagulation Profile: Recent Labs  Lab 05/04/19 1551  INR 1.1   Cardiac Enzymes: No results for input(s): CKTOTAL, CKMB, CKMBINDEX, TROPONINI in the last 168 hours. BNP (last 3 results) No results for input(s): PROBNP in the last 8760 hours. HbA1C: No results for input(s): HGBA1C in the last 72 hours. CBG: Recent Labs  Lab 05/04/19 1543 05/05/19 0022  GLUCAP 87 111*   Lipid Profile: No results for input(s): CHOL, HDL, LDLCALC, TRIG, CHOLHDL, LDLDIRECT in the last 72 hours. Thyroid Function  Tests: No results for input(s): TSH, T4TOTAL, FREET4, T3FREE, THYROIDAB in the last 72 hours. Anemia Panel: No results for input(s): VITAMINB12, FOLATE, FERRITIN, TIBC, IRON, RETICCTPCT in the last 72 hours. Urine analysis:    Component Value Date/Time   COLORURINE YELLOW (A) 05/05/2019 0841   APPEARANCEUR HAZY (A) 05/05/2019 0841   LABSPEC 1.015 05/05/2019 0841   PHURINE 6.0 05/05/2019 0841   GLUCOSEU NEGATIVE 05/05/2019 0841   HGBUR MODERATE (A) 05/05/2019 0841   BILIRUBINUR NEGATIVE 05/05/2019 0841   KETONESUR NEGATIVE 05/05/2019 0841   PROTEINUR 30 (A) 05/05/2019 0841   NITRITE NEGATIVE 05/05/2019 0841   LEUKOCYTESUR MODERATE (A) 05/05/2019 0841   Sepsis Labs: (procalcitonin:4,lacticidven:4)  ) Recent Results (from the past 240 hour(s))  Culture, blood (Routine x 2)     Status: Abnormal (Preliminary result)   Collection Time: 05/04/19  3:51 PM   Specimen: BLOOD  Result Value Ref Range  Status   Specimen Description   Final    BLOOD RIGHT ANTECUBITAL Performed at [facility], [address] [place], [place] [address]    Special Requests   Final    BOTTLES DRAWN AEROBIC AND ANAEROBIC Blood Culture results may not be optimal due to an excessive volume of blood received in culture bottles Performed at [facility], [address] [place], [place] [address]    Culture  Setup Time   Final    GRAM NEGATIVE RODS AEROBIC BOTTLE ONLY CRITICAL RESULT CALLED TO, READ BACK BY AND VERIFIED WITH: ABBY ARRINGTON 05/05/19 @ 1613  MLK    Culture (A)  Final    ESCHERICHIA COLI SUSCEPTIBILITIES TO FOLLOW Performed at [facility], [address], [place], [place] [address]    Report Status PENDING  Incomplete  Urine Culture     Status: Abnormal   Collection Time: 05/04/19  3:51 PM   Specimen: Urine, Clean Catch  Result Value Ref Range Status   Specimen Description   Final    URINE, CLEAN CATCH Performed at [facility], [address], [place], [place] [address]    Special Requests   Final    Normal Performed at [facility], [address] [place], [place] [address]    Culture >=100,000 COLONIES/mL ESCHERICHIA COLI (A)  Final   Report Status 05/07/2019 FINAL  Final   Organism ID, Bacteria ESCHERICHIA COLI (A)  Final      Susceptibility   Escherichia coli - MIC*    AMPICILLIN >=32 RESISTANT Resistant     CEFAZOLIN <=4 SENSITIVE Sensitive     CEFTRIAXONE <=1 SENSITIVE Sensitive     CIPROFLOXACIN <=0.25 SENSITIVE Sensitive     GENTAMICIN <=1 SENSITIVE Sensitive     IMIPENEM <=0.25 SENSITIVE Sensitive     NITROFURANTOIN <=16 SENSITIVE Sensitive     TRIMETH/SULFA >=320 RESISTANT Resistant     AMPICILLIN/SULBACTAM 16 INTERMEDIATE Intermediate     PIP/TAZO <=4 SENSITIVE Sensitive     Extended ESBL NEGATIVE Sensitive     * >=100,000 COLONIES/mL ESCHERICHIA COLI  Blood Culture ID Panel (Reflexed)     Status: Abnormal    Collection Time: 05/04/19  3:51 PM  Result Value Ref Range Status   Enterococcus species NOT DETECTED NOT DETECTED Final   Listeria monocytogenes NOT DETECTED NOT DETECTED Final   Staphylococcus species NOT DETECTED NOT DETECTED Final   Staphylococcus aureus (BCID) NOT DETECTED NOT DETECTED Final   Streptococcus species NOT DETECTED NOT DETECTED Final   Streptococcus agalactiae NOT DETECTED NOT DETECTED Final  Streptococcus pneumoniae NOT DETECTED NOT DETECTED Final   Streptococcus pyogenes NOT DETECTED NOT DETECTED Final   Acinetobacter baumannii NOT DETECTED NOT DETECTED Final   Enterobacteriaceae species DETECTED (A) NOT DETECTED Final    Comment: Enterobacteriaceae represent a large family of gram-negative bacteria, not a single organism. CRITICAL RESULT CALLED TO, READ BACK BY AND VERIFIED WITH: ABBY ARRINGTON 05/05/19 @ 5956  [place]    Enterobacter cloacae complex NOT DETECTED NOT DETECTED Final   Escherichia coli DETECTED (A) NOT DETECTED Final    Comment: CRITICAL RESULT CALLED TO, READ BACK BY AND VERIFIED WITH: ABBY ARRINGTON 05/05/19 @ 1613  [place]    Klebsiella oxytoca NOT DETECTED NOT DETECTED Final   Klebsiella pneumoniae NOT DETECTED NOT DETECTED Final   Proteus species NOT DETECTED NOT DETECTED Final   Serratia marcescens NOT DETECTED NOT DETECTED Final   Carbapenem resistance NOT DETECTED NOT DETECTED Final   Haemophilus influenzae NOT DETECTED NOT DETECTED Final   Neisseria meningitidis NOT DETECTED NOT DETECTED Final   Pseudomonas aeruginosa NOT DETECTED NOT DETECTED Final   Candida albicans NOT DETECTED NOT DETECTED Final   Candida glabrata NOT DETECTED NOT DETECTED Final   Candida krusei NOT DETECTED NOT DETECTED Final   Candida parapsilosis NOT DETECTED NOT DETECTED Final   Candida tropicalis NOT DETECTED NOT DETECTED Final    Comment: Performed at [facility], [address] [place], [place] [address]  Culture, blood (Routine x 2)     Status: None  (Preliminary result)   Collection Time: 05/04/19  3:54 PM   Specimen: BLOOD  Result Value Ref Range Status   Specimen Description BLOOD LEFT ANTECUBITAL  Final   Special Requests   Final    BOTTLES DRAWN AEROBIC AND ANAEROBIC Blood Culture adequate volume   Culture   Final    NO GROWTH 2 DAYS Performed at [facility], [address], [place], [place] [address]    Report Status PENDING  Incomplete  SARS Coronavirus 2 by RT PCR (hospital order, performed in [place] hospital lab) Nasopharyngeal Nasopharyngeal Swab     Status: None   Collection Time: 05/04/19  8:30 PM   Specimen: Nasopharyngeal Swab  Result Value Ref Range Status   SARS Coronavirus 2 NEGATIVE NEGATIVE Final    Comment: (NOTE) If result is NEGATIVE SARS-CoV-2 target nucleic acids are NOT DETECTED. The SARS-CoV-2 RNA is generally detectable in upper and lower  respiratory specimens during the acute phase of infection. The lowest  concentration of SARS-CoV-2 viral copies this assay can detect is 250  copies / mL. A negative result does not preclude SARS-CoV-2 infection  and should not be used as the sole basis for treatment or other  patient management decisions.  A negative result may occur with  improper specimen collection / handling, submission of specimen other  than nasopharyngeal swab, presence of viral mutation(s) within the  areas targeted by this assay, and inadequate number of viral copies  (<250 copies / mL). A negative result must be combined with clinical  observations, patient history, and epidemiological information. If result is POSITIVE SARS-CoV-2 target nucleic acids are DETECTED. The SARS-CoV-2 RNA is generally detectable in upper and lower  respiratory specimens dur ing the acute phase of infection.  Positive  results are indicative of active infection with SARS-CoV-2.  Clinical  correlation with patient history and other diagnostic information is  necessary to determine patient  infection status.  Positive results do  not rule out bacterial infection or co-infection with other  viruses. If result is PRESUMPTIVE POSTIVE SARS-CoV-2 nucleic acids MAY BE PRESENT.   A presumptive positive result was obtained on the submitted specimen  and confirmed on repeat testing.  While 2019 novel coronavirus  (SARS-CoV-2) nucleic acids may be present in the submitted sample  additional confirmatory testing may be necessary for epidemiological  and / or clinical management purposes  to differentiate between  SARS-CoV-2 and other Sarbecovirus currently known to infect humans.  If clinically indicated additional testing with an alternate test  methodology [id number]) is advised. The SARS-CoV-2 RNA is generally  detectable in upper and lower respiratory sp ecimens during the acute  phase of infection. The expected result is Negative. Fact Sheet for Patients:  BoilerBrush.com.cy Fact Sheet for Healthcare Providers: https://pope.com/ This test is not yet approved or cleared by the [place] FDA and has been authorized for detection and/or diagnosis of SARS-CoV-2 by FDA under an Emergency Use Authorization (EUA).  This EUA will remain in effect (meaning this test can be used) for the duration of the COVID-19 declaration under Section 564(b)(1) of the Act, 21 U.S.C. section 360bbb-3(b)(1), unless the authorization is terminated or revoked sooner. Performed at [facility], [address], [place], [place] [address]   Surgical PCR screen     Status: Abnormal   Collection Time: 05/05/19  6:18 AM   Specimen: Nasal Mucosa; Nasal Swab  Result Value Ref Range Status   MRSA, PCR NEGATIVE NEGATIVE Final   Staphylococcus aureus POSITIVE (A) NEGATIVE Final    Comment: (NOTE) The Xpert SA Assay (FDA approved for NASAL specimens in patients 2 years of age and older), is one component of a comprehensive surveillance program. It is  not intended to diagnose infection nor to guide or monitor treatment. Performed at [facility], [address] [place], [place] [address]   Urine culture     Status: None   Collection Time: 05/05/19  8:37 AM   Specimen: In/Out Cath Urine  Result Value Ref Range Status   Specimen Description   Final    IN/OUT CATH URINE Performed at [facility], [address], [place], [place] [address]    Special Requests   Final    NONE Performed at [facility], [address], [place], [place] [address]    Culture   Final    NO GROWTH Performed at [facility], [address] [place], [place] [address]    Report Status 05/06/2019 FINAL  Final  Urine culture     Status: Abnormal   Collection Time: 05/05/19 10:25 AM   Specimen: Urine, Random  Result Value Ref Range Status   Specimen Description   Final    URINE, RANDOM Performed at [facility], [address], [place], [place] [address]    Special Requests   Final    SYRINGE NEPHROSTOMY TUBE Performed at [facility], [address] [place], [place] [address]    Culture 20,000 COLONIES/mL ESCHERICHIA COLI (A)  Final   Report Status 05/07/2019 FINAL  Final   Organism ID, Bacteria ESCHERICHIA COLI (A)  Final      Susceptibility   Escherichia coli - MIC*    AMPICILLIN >=32 RESISTANT Resistant     CEFAZOLIN <=4 SENSITIVE Sensitive     CEFTRIAXONE <=1 SENSITIVE Sensitive     CIPROFLOXACIN <=0.25 SENSITIVE Sensitive     GENTAMICIN <=1 SENSITIVE Sensitive     IMIPENEM <=0.25 SENSITIVE Sensitive     NITROFURANTOIN <=16 SENSITIVE Sensitive  TRIMETH/SULFA >=320 RESISTANT Resistant     AMPICILLIN/SULBACTAM 16 INTERMEDIATE Intermediate     PIP/TAZO <=4 SENSITIVE Sensitive     Extended ESBL NEGATIVE Sensitive     * 20,000 COLONIES/mL ESCHERICHIA COLI      Studies: No results found.  Scheduled Meds: . amLODipine  10 mg Oral Daily  . atenolol  50 mg Oral Daily  .  atorvastatin  20 mg Oral Daily  . azaTHIOprine  50 mg Oral Daily  . Chlorhexidine Gluconate Cloth  6 each Topical Daily  . enoxaparin (LOVENOX) injection  40 mg Subcutaneous Q24H  . escitalopram  10 mg Oral Daily  . folic acid  1 mg Oral Daily  . hydroxychloroquine  200 mg Oral QODAY  . mupirocin ointment  1 application Nasal BID  . senna  2 tablet Oral QHS    Continuous Infusions: . sodium chloride 100 mL/hr at 05/07/19 0722  . lactated ringers Stopped (05/05/19 0140)  . meropenem (MERREM) IV 1 g (05/07/19 0538)     LOS: 3 days     Darlin Drop, MD [facility] Pager [phone]  If 7PM-7AM, please contact night-coverage [email or web address] Password [email or web address] 05/07/2019, 1:56 PM

## 2019-05-08 LAB — CULTURE, BLOOD (ROUTINE X 2)

## 2019-05-08 LAB — BASIC METABOLIC PANEL
Anion gap: 9 (ref 5–15)
BUN: 14 mg/dL (ref 6–20)
CO2: 24 mmol/L (ref 22–32)
Calcium: 8.6 mg/dL — ABNORMAL LOW (ref 8.9–10.3)
Chloride: 109 mmol/L (ref 98–111)
Creatinine, Ser: 0.72 mg/dL (ref 0.44–1.00)
GFR calc Af Amer: >60 mL/min (ref >60)
GFR calc non Af Amer: >60 mL/min (ref >60)
Glucose, Bld: 89 mg/dL (ref 70–99)
Potassium: 3.3 mmol/L — ABNORMAL LOW (ref 3.5–5.1)
Sodium: 142 mmol/L (ref 135–145)

## 2019-05-08 LAB — CBC
HCT: 29.9 % — ABNORMAL LOW (ref 36.0–46.0)
Hemoglobin: 10.1 g/dL — ABNORMAL LOW (ref 12.0–15.0)
MCH: 28.5 pg (ref 26.0–34.0)
MCHC: 33.8 g/dL (ref 30.0–36.0)
MCV: 84.5 fL (ref 80.0–100.0)
Platelets: 198 10*3/uL (ref 150–400)
RBC: 3.54 MIL/uL — ABNORMAL LOW (ref 3.87–5.11)
RDW: 13.3 % (ref 11.5–15.5)
WBC: 7 10*3/uL (ref 4.0–10.5)
nRBC: 0 % (ref 0.0–0.2)

## 2019-05-08 LAB — MAGNESIUM: Magnesium: 1.6 mg/dL — ABNORMAL LOW (ref 1.7–2.4)

## 2019-05-08 MED ORDER — POTASSIUM CHLORIDE CRYS ER 20 MEQ PO TBCR
40.0000 meq | EXTENDED_RELEASE_TABLET | Freq: Once | ORAL | Status: AC
  Administered 2019-05-08: 40 meq via ORAL
  Filled 2019-05-08: qty 2

## 2019-05-08 MED ORDER — CEFAZOLIN SODIUM-DEXTROSE 2-4 GM/100ML-% IV SOLN
2.0000 g | Freq: Three times a day (TID) | INTRAVENOUS | Status: DC
  Administered 2019-05-08 (×2): 2 g via INTRAVENOUS
  Filled 2019-05-08 (×5): qty 100

## 2019-05-08 MED ORDER — SODIUM CHLORIDE 0.9 % IV SOLN
INTRAVENOUS | Status: DC | PRN
  Administered 2019-05-08: 250 mL via INTRAVENOUS

## 2019-05-08 MED ORDER — MAGNESIUM SULFATE 2 GM/50ML IV SOLN
2.0000 g | Freq: Once | INTRAVENOUS | Status: AC
  Administered 2019-05-08: 2 g via INTRAVENOUS
  Filled 2019-05-08: qty 50

## 2019-05-08 NOTE — Consult Note (Signed)
[place] for Electrolyte Monitoring and Replacement   Recent Labs: Potassium (mmol/L)  Date Value  05/08/2019 3.3 (L)   Magnesium (mg/dL)  Date Value  05/08/2019 1.6 (L)   Calcium (mg/dL)  Date Value  05/08/2019 8.6 (L)   Albumin (g/dL)  Date Value  05/06/2019 2.6 (L)   Sodium (mmol/L)  Date Value  05/08/2019 142   Corrected Ca: 9.72 mg/dL  Assessment: 53 y.o.femalewith a known history of type 2 diabetes mellitus, hypertension and systemic lupus erythematosus with E coli bacteremia  Goal of Therapy:  Electrolytes WNL  Plan:   Replace potassium with 40 mEq oral KCl x 1  Replace magnesium with 2 grams IV magnesium sulfate x 1  Follow-up electrolytes in am  Lindsay Dean ,PharmD Clinical Pharmacist 05/08/2019 12:11 PM

## 2019-05-08 NOTE — Progress Notes (Signed)
PROGRESS NOTE  Lindsay Dean ZOX:[id number] DOB: December 01, 1965 DOA: 05/04/2019 PCP: Preston Fleetingevelo, Adrian Mancheno, MD  HPI/Recap of past 24 hours: Lindsay Dean  is a 53 y.o. female with a known history of type 2 diabetes mellitus, hypertension and systemic lupus erythematosus, presented to the emergency room with a consult of fever and chills today with associated nausea without vomiting.  She has been having bilateral flank pain.  She admitted to urinary frequency and urgency with associated dysuria without hematuria.  No cough or wheezing or shortness of breath.  No chest pain or palpitations.  Upon presentation to the emergency room, temperature was 103.1, heart rate 85 with a blood pressure of 118/98 respiratory to 28 and pulse symmetry of 99% on room air.  Labs revealed borderline potassium of 3.5 and lactic acid level of 0.9 twice.  Creatinine was 1.12 up from 0.82 by a year ago.  CBC showed mild anemia with hemoglobin of 11.5 hematocrit 35.4 close to previous levels.  Urinalysis was strongly positive for UTI with many bacteria and more than 50 WBCs.  Blood culture as well as urine culture was sent.  Two-view chest x-ray showed no acute cardiopulmonary disease.  EKG showed no sinus rhythm with a rate of 82 with minimal voltage criteria for LVH. Abdominal pelvic CT scan showed moderate right hydroureteral nephrosis with the ureter dilated to the bladder insertion.  There is possible but not definite punctate distal ureteric stone at the UV junction.  Right perinephric edema may be obstructing or infectious.  There was additional nonobstructing bilateral nephrolithiasis and colonic diverticulosis without diverticulitis.  She [facility] of this year and has been followed by Dr. Lauris ChromanJohn Jelovsek with urogynecology.  The patient was given 1 g of IV Rocephin and 650 mg p.o. Tylenol.  On-call urologist Dr. Richardo HanksSninskY was notified.  She will be admitted to  medically monitored bed for further evaluation and management.  Post cystoscopy on 05/05/19 by Dr. Richardo HanksSninsky, urology, unable to pass R ureteral stent. Post R percutaneous nephrostomy by IR.  Blood culture positive for E. coli 1 out of 2 bottles.  On meropenem empirically.  Urine cx positive for ecoli. Sensitivities return for urine cx showed resistance to  Ampicillin, unasyn and bactrim.  Awaiting sensitivities for positive e-coli blood cxs.  05/08/19: Reports right flank pain. No nausea. Deescalate meropenem to cefazolin. Pharmacy assisting. Highly appreciated.   Assessment/Plan: Active Problems:   Sepsis (HCC)  Sepsis secondary to right pyelonephritis and UTI in the setting of infected obstructed right collecting system post R PCN by IR on 05/05/19 Presented with fever with T-max 103.1 and leukocytosis CT scan suggestive of right pyelonephritis Completed meropenem on 05/08/19 Post right ureteral stent placement 05/05/19 by urology. Urine culture taken on 05/04/2019 + for greater than 100,000 E. coli with resistance to ampicillin, Unasyn and Bactrim Blood cultures taken on 05/04/2019 + for E. coli, awaiting sensitivities Sepsis physiology is resolving Continue IV fluid hydration and monitoring of renal function. Continue to monitor output from right percutaneous nephrostomy. Continue to monitor fever curve and WBC  Resolved Non anion gap metabolic acidosis likely in the setting of sepsis Serum bicarb 19 on 05/06/2019 with anion gap of 5>> serum bicarb 24 and AG 9 Received sodium bicarb p.o. 1300 mg twice daily x1 day.  Resolved AKI likely prerenal in the setting of dehydration Baseline creatinine 0.7 with GFR greater than 60 Presented with creatinine of 1.12 with GFR of 57 Appears to  be back to her baseline creatinine Continue IV fluid hydration Continue to avoid nephrotoxins like NSAIDs Continue to monitor urine output Repeat BMP in the morning  Hypertension.   Blood  pressure is at goal C/ home antihypertensive medications  Blood pressure is currently at goal  Continue to monitor vital signs   Dyslipidemia.    Continue home medications  Systemic lupus erythematosus.    C/ her Plaquenil and Imuran.  Overactive bladder.    Continue Ditropan XL and Myrbetriq   DVT prophylaxis.  Subcutaneous Lovenox daily  Disposition: Possible discharge to home with home health services in the next 24 to 48 hours pending sensitivities of blood cultures.  CODE STATUS: Full code    Objective: Vitals:   05/07/19 2030 05/08/19 0526 05/08/19 0800 05/08/19 1451  BP: 136/72 (!) 121/59 140/80 118/68  Pulse: 65 63 68 63  Resp: Temp: 99.2 F (37.3 C) 98.2 F (36.8 C) 98.9 F (37.2 C) 99 F (37.2 C)  TempSrc: Oral Oral Oral Oral  SpO2: 100% 97% 97% 96%  Weight:      Height:        Intake/Output Summary (Last 24 hours) at 05/08/2019 1518 Last data filed at 05/08/2019 1300 Gross per 24 hour  Intake 6110.54 ml  Output 900 ml  Net 5210.54 ml   Filed Weights   05/04/19 1536  Weight: 71.2 kg    Exam:  . General: 53 y.o. year-old female well-developed well-nourished in no acute distress.  Alert oriented x4.   . Cardiovascular: Regular rate and rhythm no rubs or gallops.  Respiratory: Clear to auscultation no wheezes or rales..  Abdomen: Soft nontender nondistended normal bowel sounds.  Musculoskeletal: No lower extremity edema.Marland Kitchen   Psychiatry: Mood is appropriate for condition and setting.  Data Reviewed: CBC: Recent Labs  Lab 05/04/19 1551 05/05/19 0454 05/06/19 0714 05/08/19 0600  WBC 9.5 13.5* 10.0 7.0  NEUTROABS 8.1*  --  7.8*  --   HGB 11.5* 10.0* 9.2* 10.1*  HCT 35.4* 31.2* 27.7* 29.9*  MCV 88.7 89.7 86.3 84.5  PLT 213 177 161 198   Basic Metabolic Panel: Recent Labs  Lab 05/04/19 1551 05/05/19 0454 05/06/19 0714 05/08/19 0600  NA 138 140 138 142  K 3.5 3.6 3.6 3.3*  CL 108 112* 114* 109  CO2 19* 18* 19* 24  GLUCOSE  100* 157* 112* 89  BUN 27* 30* 29* 14  CREATININE 1.12* 1.09* 0.73 0.72  CALCIUM 9.4 8.3* 7.9* 8.6*  MG  --   --  2.0 1.6*   GFR: Estimated Creatinine Clearance: 68.5 mL/min (by C-G formula based on SCr of 0.72 mg/dL). Liver Function Tests: Recent Labs  Lab 05/04/19 1551 05/06/19 0714  AST 25 16  ALT 19 15  ALKPHOS 97 65  BILITOT 0.5 0.2*  PROT 9.2* 7.1  ALBUMIN 3.7 2.6*   No results for input(s): LIPASE, AMYLASE in the last 168 hours. No results for input(s): AMMONIA in the last 168 hours. Coagulation Profile: Recent Labs  Lab 05/04/19 1551  INR 1.1   Cardiac Enzymes: No results for input(s): CKTOTAL, CKMB, CKMBINDEX, TROPONINI in the last 168 hours. BNP (last 3 results) No results for input(s): PROBNP in the last 8760 hours. HbA1C: No results for input(s): HGBA1C in the last 72 hours. CBG: Recent Labs  Lab 05/04/19 1543 05/05/19 0022  GLUCAP 87 111*   Lipid Profile: No results for input(s): CHOL, HDL, LDLCALC, TRIG, CHOLHDL, LDLDIRECT in the last 72 hours. Thyroid  Function Tests: No results for input(s): TSH, T4TOTAL, FREET4, T3FREE, THYROIDAB in the last 72 hours. Anemia Panel: No results for input(s): VITAMINB12, FOLATE, FERRITIN, TIBC, IRON, RETICCTPCT in the last 72 hours. Urine analysis:    Component Value Date/Time   COLORURINE YELLOW (A) 05/05/2019 0841   APPEARANCEUR HAZY (A) 05/05/2019 0841   LABSPEC 1.015 05/05/2019 0841   PHURINE 6.0 05/05/2019 0841   GLUCOSEU NEGATIVE 05/05/2019 0841   HGBUR MODERATE (A) 05/05/2019 0841   BILIRUBINUR NEGATIVE 05/05/2019 0841   KETONESUR NEGATIVE 05/05/2019 0841   PROTEINUR 30 (A) 05/05/2019 0841   NITRITE NEGATIVE 05/05/2019 0841   LEUKOCYTESUR MODERATE (A) 05/05/2019 0841   Sepsis Labs: @LABRCNTIP (procalcitonin:4,lacticidven:4)  ) Recent Results (from the past 240 hour(s))  Culture, blood (Routine x 2)     Status: Abnormal   Collection Time: 05/04/19  3:51 PM   Specimen: BLOOD  Result Value Ref  Range Status   Specimen Description   Final    BLOOD RIGHT ANTECUBITAL Performed at [facility], [address] [place], [place]    Special Requests   Final    BOTTLES DRAWN AEROBIC AND ANAEROBIC Blood Culture results may not be optimal due to an excessive volume of blood received in culture bottles Performed at [facility], [address] [place], [place]    Culture  Setup Time   Final    GRAM NEGATIVE RODS AEROBIC BOTTLE ONLY CRITICAL RESULT CALLED TO, READ BACK BY AND VERIFIED WITH: ABBY ARRINGTON 05/05/19 @ 1613  MLK Performed at [facility], [address], [place], [place]    Culture ESCHERICHIA COLI (A)  Final   Report Status 05/08/2019 FINAL  Final   Organism ID, Bacteria ESCHERICHIA COLI  Final      Susceptibility   Escherichia coli - MIC*    AMPICILLIN >=32 RESISTANT Resistant     CEFAZOLIN <=4 SENSITIVE Sensitive     CEFEPIME <=1 SENSITIVE Sensitive     CEFTAZIDIME <=1 SENSITIVE Sensitive     CEFTRIAXONE <=1 SENSITIVE Sensitive     CIPROFLOXACIN <=0.25 SENSITIVE Sensitive     GENTAMICIN <=1 SENSITIVE Sensitive     IMIPENEM <=0.25 SENSITIVE Sensitive     TRIMETH/SULFA >=320 RESISTANT Resistant     AMPICILLIN/SULBACTAM 16 INTERMEDIATE Intermediate     PIP/TAZO <=4 SENSITIVE Sensitive     Extended ESBL NEGATIVE Sensitive     * ESCHERICHIA COLI  Urine Culture     Status: Abnormal   Collection Time: 05/04/19  3:51 PM   Specimen: Urine, Clean Catch  Result Value Ref Range Status   Specimen Description   Final    URINE, CLEAN CATCH Performed at [facility], [address], [place], [place]    Special Requests   Final    Normal Performed at [facility], [address] [place], [place]    Culture >=100,000 COLONIES/mL ESCHERICHIA COLI (A)  Final   Report Status 05/07/2019 FINAL  Final   Organism ID, Bacteria ESCHERICHIA COLI (A)  Final      Susceptibility    Escherichia coli - MIC*    AMPICILLIN >=32 RESISTANT Resistant     CEFAZOLIN <=4 SENSITIVE Sensitive     CEFTRIAXONE <=1 SENSITIVE Sensitive     CIPROFLOXACIN <=0.25 SENSITIVE Sensitive     GENTAMICIN <=1 SENSITIVE Sensitive     IMIPENEM <=0.25 SENSITIVE Sensitive     NITROFURANTOIN <=16 SENSITIVE Sensitive     TRIMETH/SULFA >=320 RESISTANT Resistant  AMPICILLIN/SULBACTAM 16 INTERMEDIATE Intermediate     PIP/TAZO <=4 SENSITIVE Sensitive     Extended ESBL NEGATIVE Sensitive     * >=100,000 COLONIES/mL ESCHERICHIA COLI  Blood Culture ID Panel (Reflexed)     Status: Abnormal   Collection Time: 05/04/19  3:51 PM  Result Value Ref Range Status   Enterococcus species NOT DETECTED NOT DETECTED Final   Listeria monocytogenes NOT DETECTED NOT DETECTED Final   Staphylococcus species NOT DETECTED NOT DETECTED Final   Staphylococcus aureus (BCID) NOT DETECTED NOT DETECTED Final   Streptococcus species NOT DETECTED NOT DETECTED Final   Streptococcus agalactiae NOT DETECTED NOT DETECTED Final   Streptococcus pneumoniae NOT DETECTED NOT DETECTED Final   Streptococcus pyogenes NOT DETECTED NOT DETECTED Final   Acinetobacter baumannii NOT DETECTED NOT DETECTED Final   Enterobacteriaceae species DETECTED (A) NOT DETECTED Final    Comment: Enterobacteriaceae represent a large family of gram-negative bacteria, not a single organism. CRITICAL RESULT CALLED TO, READ BACK BY AND VERIFIED WITH: ABBY ARRINGTON 05/05/19 @ 1613  MLK    Enterobacter cloacae complex NOT DETECTED NOT DETECTED Final   Escherichia coli DETECTED (A) NOT DETECTED Final    Comment: CRITICAL RESULT CALLED TO, READ BACK BY AND VERIFIED WITH: ABBY ARRINGTON 05/05/19 @ 1613  MLK    Klebsiella oxytoca NOT DETECTED NOT DETECTED Final   Klebsiella pneumoniae NOT DETECTED NOT DETECTED Final   Proteus species NOT DETECTED NOT DETECTED Final   Serratia marcescens NOT DETECTED NOT DETECTED Final   Carbapenem resistance NOT DETECTED NOT  DETECTED Final   Haemophilus influenzae NOT DETECTED NOT DETECTED Final   Neisseria meningitidis NOT DETECTED NOT DETECTED Final   Pseudomonas aeruginosa NOT DETECTED NOT DETECTED Final   Candida albicans NOT DETECTED NOT DETECTED Final   Candida glabrata NOT DETECTED NOT DETECTED Final   Candida krusei NOT DETECTED NOT DETECTED Final   Candida parapsilosis NOT DETECTED NOT DETECTED Final   Candida tropicalis NOT DETECTED NOT DETECTED Final    Comment: Performed at [facility], [address] [place], [place] [address]  Culture, blood (Routine x 2)     Status: None (Preliminary result)   Collection Time: 05/04/19  3:54 PM   Specimen: BLOOD  Result Value Ref Range Status   Specimen Description BLOOD LEFT ANTECUBITAL  Final   Special Requests   Final    BOTTLES DRAWN AEROBIC AND ANAEROBIC Blood Culture adequate volume   Culture   Final    NO GROWTH 4 DAYS Performed at [facility], [address] [place], [place] [address]    Report Status PENDING  Incomplete  SARS Coronavirus 2 by RT PCR (hospital order, performed in [facility] hospital lab) Nasopharyngeal Nasopharyngeal Swab     Status: None   Collection Time: 05/04/19  8:30 PM   Specimen: Nasopharyngeal Swab  Result Value Ref Range Status   SARS Coronavirus 2 NEGATIVE NEGATIVE Final    Comment: (NOTE) If result is NEGATIVE SARS-CoV-2 target nucleic acids are NOT DETECTED. The SARS-CoV-2 RNA is generally detectable in upper and lower  respiratory specimens during the acute phase of infection. The lowest  concentration of SARS-CoV-2 viral copies this assay can detect is 250  copies / mL. A negative result does not preclude SARS-CoV-2 infection  and should not be used as the sole basis for treatment or other  patient management decisions.  A negative result may occur with  improper specimen collection / handling, submission of specimen other  than nasopharyngeal swab, presence  of viral mutation(s) within the   areas targeted by this assay, and inadequate number of viral copies  (<250 copies / mL). A negative result must be combined with clinical  observations, patient history, and epidemiological information. If result is POSITIVE SARS-CoV-2 target nucleic acids are DETECTED. The SARS-CoV-2 RNA is generally detectable in upper and lower  respiratory specimens dur ing the acute phase of infection.  Positive  results are indicative of active infection with SARS-CoV-2.  Clinical  correlation with patient history and other diagnostic information is  necessary to determine patient infection status.  Positive results do  not rule out bacterial infection or co-infection with other viruses. If result is PRESUMPTIVE POSTIVE SARS-CoV-2 nucleic acids MAY BE PRESENT.   A presumptive positive result was obtained on the submitted specimen  and confirmed on repeat testing.  While 2019 novel coronavirus  (SARS-CoV-2) nucleic acids may be present in the submitted sample  additional confirmatory testing may be necessary for epidemiological  and / or clinical management purposes  to differentiate between  SARS-CoV-2 and other Sarbecovirus currently known to infect humans.  If clinically indicated additional testing with an alternate test  methodology [phone]) is advised. The SARS-CoV-2 RNA is generally  detectable in upper and lower respiratory sp ecimens during the acute  phase of infection. The expected result is Negative. Fact Sheet for Patients:  StrictlyIdeas.no Fact Sheet for Healthcare Providers: BankingDealers.co.za This test is not yet approved or cleared by the [place] FDA and has been authorized for detection and/or diagnosis of SARS-CoV-2 by FDA under an Emergency Use Authorization (EUA).  This EUA will remain in effect (meaning this test can be used) for the duration of the COVID-19 declaration under Section 564(b)(1) of the Act, 21 U.S.C.  section 360bbb-3(b)(1), unless the authorization is terminated or revoked sooner. Performed at [facility], [address], [place], [place] [address]   Surgical PCR screen     Status: Abnormal   Collection Time: 05/05/19  6:18 AM   Specimen: Nasal Mucosa; Nasal Swab  Result Value Ref Range Status   MRSA, PCR NEGATIVE NEGATIVE Final   Staphylococcus aureus POSITIVE (A) NEGATIVE Final    Comment: (NOTE) The Xpert SA Assay (FDA approved for NASAL specimens in patients 22 years of age and older), is one component of a comprehensive surveillance program. It is not intended to diagnose infection nor to guide or monitor treatment. Performed at [facility], [address], [place], [place] [address]   Urine culture     Status: None   Collection Time: 05/05/19  8:37 AM   Specimen: In/Out Cath Urine  Result Value Ref Range Status   Specimen Description   Final    IN/OUT CATH URINE Performed at [facility], [address] [place], [place] [address]    Special Requests   Final    NONE Performed at [facility], [address] [place], [place] [address]    Culture   Final    NO GROWTH Performed at [facility], [place] [address], [place], [place] [address]    Report Status 05/06/2019 FINAL  Final  Urine culture     Status: Abnormal   Collection Time: 05/05/19 10:25 AM   Specimen: Urine, Random  Result Value Ref Range Status   Specimen Description   Final    URINE, RANDOM Performed at [facility], [address] [place], [place] [address]    Special Requests   Final  SYRINGE NEPHROSTOMY TUBE Performed at [facility], [address] [place], [place] [address]    Culture 20,000 COLONIES/mL ESCHERICHIA COLI (A)  Final   Report Status 05/07/2019 FINAL  Final   Organism ID, Bacteria ESCHERICHIA COLI (A)  Final      Susceptibility   Escherichia coli - MIC*    AMPICILLIN >=32 RESISTANT Resistant     CEFAZOLIN  <=4 SENSITIVE Sensitive     CEFTRIAXONE <=1 SENSITIVE Sensitive     CIPROFLOXACIN <=0.25 SENSITIVE Sensitive     GENTAMICIN <=1 SENSITIVE Sensitive     IMIPENEM <=0.25 SENSITIVE Sensitive     NITROFURANTOIN <=16 SENSITIVE Sensitive     TRIMETH/SULFA >=320 RESISTANT Resistant     AMPICILLIN/SULBACTAM 16 INTERMEDIATE Intermediate     PIP/TAZO <=4 SENSITIVE Sensitive     Extended ESBL NEGATIVE Sensitive     * 20,000 COLONIES/mL ESCHERICHIA COLI  CULTURE, BLOOD (ROUTINE X 2) w Reflex to ID Panel     Status: None (Preliminary result)   Collection Time: 05/06/19  7:12 AM   Specimen: BLOOD  Result Value Ref Range Status   Specimen Description BLOOD RAC  Final   Special Requests   Final    BOTTLES DRAWN AEROBIC AND ANAEROBIC Blood Culture adequate volume   Culture   Final    NO GROWTH 2 DAYS Performed at [facility], [address], [place], [place] [address]    Report Status PENDING  Incomplete  CULTURE, BLOOD (ROUTINE X 2) w Reflex to ID Panel     Status: None (Preliminary result)   Collection Time: 05/06/19  7:14 AM   Specimen: BLOOD  Result Value Ref Range Status   Specimen Description BLOOD [facility]  Final   Special Requests   Final    BOTTLES DRAWN AEROBIC AND ANAEROBIC Blood Culture adequate volume   Culture   Final    NO GROWTH 2 DAYS Performed at [facility], [address], [place], [place] [address]    Report Status PENDING  Incomplete      Studies: No results found.  Scheduled Meds: . amLODipine  10 mg Oral Daily  . atenolol  50 mg Oral Daily  . atorvastatin  20 mg Oral Daily  . azaTHIOprine  50 mg Oral Daily  . Chlorhexidine Gluconate Cloth  6 each Topical Daily  . enoxaparin (LOVENOX) injection  40 mg Subcutaneous Q24H  . escitalopram  10 mg Oral Daily  . folic acid  1 mg Oral Daily  . hydroxychloroquine  200 mg Oral QODAY  . mupirocin ointment  1 application Nasal BID  . senna  2 tablet Oral QHS    Continuous Infusions: . sodium chloride  100 mL/hr at 05/08/19 1433  . sodium chloride 250 mL (05/08/19 1425)  .  ceFAZolin (ANCEF) IV 2 g (05/08/19 1426)  . lactated ringers Stopped (05/05/19 0140)  . magnesium sulfate bolus IVPB       LOS: 4 days     Darlin Drop, MD [facility] Pager [phone]  If 7PM-7AM, please contact night-coverage [email or web address] Password [facility] 05/08/2019, 3:18 PM

## 2019-05-09 LAB — BASIC METABOLIC PANEL
Anion gap: 5 (ref 5–15)
BUN: 12 mg/dL (ref 6–20)
CO2: 24 mmol/L (ref 22–32)
Calcium: 8.3 mg/dL — ABNORMAL LOW (ref 8.9–10.3)
Chloride: 109 mmol/L (ref 98–111)
Creatinine, Ser: 0.68 mg/dL (ref 0.44–1.00)
GFR calc Af Amer: >60 mL/min (ref >60)
GFR calc non Af Amer: >60 mL/min (ref >60)
Glucose, Bld: 94 mg/dL (ref 70–99)
Potassium: 3.7 mmol/L (ref 3.5–5.1)
Sodium: 138 mmol/L (ref 135–145)

## 2019-05-09 LAB — CULTURE, BLOOD (ROUTINE X 2)
Culture: NO GROWTH
Special Requests: ADEQUATE

## 2019-05-09 LAB — MAGNESIUM: Magnesium: 2.3 mg/dL (ref 1.7–2.4)

## 2019-05-09 MED ORDER — CEPHALEXIN 500 MG PO CAPS
500.0000 mg | ORAL_CAPSULE | Freq: Three times a day (TID) | ORAL | Status: DC
  Administered 2019-05-09: 500 mg via ORAL
  Filled 2019-05-09: qty 1

## 2019-05-09 MED ORDER — CEPHALEXIN 500 MG PO CAPS: 500.0000 mg | ORAL_CAPSULE | Freq: Four times a day (QID) | ORAL | 0 refills | Status: AC

## 2019-05-09 MED ORDER — CEPHALEXIN 500 MG PO CAPS: 500.0000 mg | ORAL_CAPSULE | Freq: Four times a day (QID) | ORAL | Status: DC

## 2019-05-09 MED ORDER — OXYCODONE HCL 5 MG PO TABS: 5.0000 mg | ORAL_TABLET | Freq: Every day | ORAL | 0 refills | Status: DC | PRN

## 2019-05-09 NOTE — Evaluation (Signed)
Physical Therapy Evaluation Patient Details Name: Lindsay Dean MRN: [id number] DOB: [birth year] Today's Date: 05/09/2019   History of Present Illness   53 y.o. female with a known history of type 2 diabetes mellitus, hypertension and systemic lupus erythematosus, presented to the emergency room with complaints of fever and chills associated with nausea without vomiting.  She has been having bilateral flank pain.  Nephrostomy 11/13  Clinical Impression  Pt did very well with PT exam, showed no functional limitations or safety concerns.  Easily circumambulates the nurses station w/o AD, w/o fatigue and HR and O2 remained stable and appropriate t/o the effort.  Pt with no needs, safe to return home from PT perspective.    Follow Up Recommendations No PT follow up    Equipment Recommendations  None recommended by PT    Recommendations for Other Services       Precautions / Restrictions Precautions Precautions: None Restrictions Weight Bearing Restrictions: No      Mobility  Bed Mobility Overal bed mobility: Independent             General bed mobility comments: Pt easily gets to sitting EOB w/o assist  Transfers Overall transfer level: Independent Equipment used: None             General transfer comment: Pt was able to easily and confidently rise to standing w/o assist  Ambulation/Gait Ambulation/Gait assistance: Independent Gait Distance (Feet): 200 Feet Assistive device: None       General Gait Details: Pt with confidence, safe and with community appropriate speed ambulation.   Stairs            Wheelchair Mobility    Modified Rankin (Stroke Patients Only)       Balance Overall balance assessment: Independent                                           Pertinent Vitals/Pain Pain Assessment: No/denies pain(minimal chronic R shoulder pain)    Home Living Family/patient expects to be discharged to:: Private  residence Living Arrangements: (husband lives just behind her and can help QD) Available Help at Discharge: Family Type of Home: Mobile home Home Access: Level entry     Home Layout: One level Home Equipment: None      Prior Function Level of Independence: Independent         Comments: Pt cares for her 100 y/o grandchild, able to all she needs w/o assist     Hand Dominance        Extremity/Trunk Assessment   Upper Extremity Assessment Upper Extremity Assessment: Overall WFL for tasks assessed(minimal )    Lower Extremity Assessment Lower Extremity Assessment: Overall WFL for tasks assessed       Communication   Communication: Prefers language other than English(interpreter Maritza present and interpreting t/o eval)  Cognition Arousal/Alertness: Awake/alert Behavior During Therapy: WFL for tasks assessed/performed Overall Cognitive Status: Within Functional Limits for tasks assessed                                        General Comments      Exercises     Assessment/Plan    PT Assessment Patent does not need any further PT services  PT Problem List  PT Treatment Interventions      PT Goals (Current goals can be found in the Care Plan section)  Acute Rehab PT Goals Patient Stated Goal: go home PT Goal Formulation: All assessment and education complete, DC therapy    Frequency     Barriers to discharge        Co-evaluation               AM-PAC PT "6 Clicks" Mobility  Outcome Measure Help needed turning from your back to your side while in a flat bed without using bedrails?: None Help needed moving from lying on your back to sitting on the side of a flat bed without using bedrails?: None Help needed moving to and from a bed to a chair (including a wheelchair)?: None Help needed standing up from a chair using your arms (e.g., wheelchair or bedside chair)?: None Help needed to walk in hospital room?: None Help needed  climbing 3-5 steps with a railing? : None 6 Click Score: 24    End of Session Equipment Utilized During Treatment: Gait belt Activity Tolerance: Patient tolerated treatment well Patient left: in bed;with call bell/phone within reach Nurse Communication: Mobility status PT Visit Diagnosis: Muscle weakness (generalized) (M62.81);Difficulty in walking, not elsewhere classified (R26.2)    Time: 5093-2671 PT Time Calculation (min) (ACUTE ONLY): 16 min   Charges:   PT Evaluation $PT Eval Low Complexity: 1 Low          Malachi Pro, DPT 05/09/2019, 11:40 AM

## 2019-05-09 NOTE — Discharge Summary (Signed)
Discharge Summary  Lindsay Dean Dean ZHY:[id number] DOB: May 06, 1966  PCP: Preston Fleetingevelo, Adrian Mancheno, MD  Admit date: 05/04/2019 Discharge date: 05/09/2019  Time spent: 35 minutes  Recommendations for Outpatient Follow-up:  1. Follow-up with your [facility]rologist at [facility] 2. Follow-up with your primary care provider 3. Follow-up with your rheumatologist 4. Take your medications as prescribed  Discharge Diagnoses:  Active Hospital Problems   Diagnosis Date Noted   Sepsis [facility](HCC) 05/04/2019    Resolved Hospital Problems  No resolved problems to display.    Discharge Condition: Stable  Diet recommendation: Resume previous diet  Vitals:   05/09/19 0452 05/09/19 0833  BP: 127/82 132/75  Pulse: 61 64  Resp: 20 18  Temp: 98.4 F (36.9 C) 98.2 F (36.8 C)  SpO2: 100% 98%    History of present illness:  Lindsay Garciais a53 y.o.femalewith a known history of type 2 diabetes mellitus, hypertension and systemic lupus erythematosus, presented to the emergency room with complaints of feverand chills associated with nausea without vomiting. She has been having bilateral flank pain. She admitted to urinary frequency and urgency with associated dysuria without hematuria. No cough or wheezing or shortness of breath. No chest pain or palpitations.  Upon presentation to the emergency room, temperature was 103.1, heart rate 85 with a blood pressure of 118/98 respiratory to 28 and pulse symmetry of 99% on room air. Labs revealed borderline potassium of 3.5 and lactic acid level of 0.9 twice. Creatinine was 1.12 up from 0.82 by a year ago. CBC showed mild anemia with hemoglobin of 11.5 hematocrit 35.4 close to previous levels. Urinalysis was strongly positive for UTI with many bacteria and more than 50 WBCs. Blood culture as well as urine culture was sent. Two-view chest x-ray showed no acute cardiopulmonary disease.EKG showed no sinus rhythm with a rate of 82 with minimal voltage  criteria for LVH. Abdominal pelvic CT scan showed moderate right hydroureteral nephrosis with the ureter dilated to the bladder insertion. There is possible but not definite punctate distal ureteric stone at the UV junction. Right perinephric edema may be obstructing or infectious. There was additional nonobstructing bilateral nephrolithiasis and colonic diverticulosis without diverticulitis.She [facility] and has been followed by Dr. Arlean HoppingJohn Jelovsekwith urogynecology.  The patient was given 1 g of IV Rocephin and 650 mg p.o. Tylenol. On-call urologistDr. Morene RankinsSninskYwas notified. She was admitted to medically monitored bed for further evaluation and management.  Post cystoscopy on 05/05/19 by Dr. Richardo HanksSninsky, urology, unable to pass R ureteral stent.  Then post R percutaneous nephrostomy by IR.  Blood culture positive for E. coli 1 out of 2 bottles.    Was started on meropenem empirically.  Urine cx positive for ecoli. Sensitivities returned with resistance to  Ampicillin, unasyn and bactrim.  Started on Keflex 500 mg 4 times daily x14 days on 05/09/2019.  Discussed with urology Dr. Richardo HanksSninsky on 05/09/2019, okay for her to [facility].  05/09/19: Patient was seen and examined at her bedside this morning.  No acute events overnight.  She has no new complaints.  Reports intermittent right flank pain at the level of her nephrostomy tube.  No obstruction noted.  Denies any abdominal pain or nausea.  Tolerating a diet well.  No cardiopulmonary symptoms.  Vital signs and labs reviewed and are stable.  Assessed by PT, no further recommendations.  On the day of discharge, the patient was hemodynamically stable.  She will need to follow-up  with her urologist at [facility] posthospitalization.  She will also need to follow-up with her primary care provider and rheumatologist posthospitalization.  Patient understands and agrees to  plan.    Hospital Course:  Active Problems:   Sepsis ([place])  Resolving sepsis secondary to right pyelonephritis, E. coli bacteremia, and E. coli UTI in the setting of infected obstructed right collecting system post R PCN by IR on 05/05/19 Presented with fever with T-max 103.1 and leukocytosis CT scan suggestive of right pyelonephritis Completed meropenem on 05/08/19 Post right ureteral stent placement 05/05/19 by urology. Urine culture taken on 05/04/2019 + for greater than 100,000 E. coli with resistance to ampicillin, Unasyn and Bactrim Blood cultures taken on 05/04/2019 + for E. Coli 1 out of 2 bottles.  Repeated blood cultures x2 done on 05/06/2019 - to date. Antibiotics switched to cefazolin on 05/08/2019. Sensitivities returned with resistance to ampicillin, Unasyn, and Bactrim Started on Keflex 500 mg every 6 hours x14 days. Follow-up with your urologist at [facility] with your PCP Avoid dehydration  E. coli bacteremia, POA Management as stated above Complete course of antibiotics  Resolved Non anion gap metabolic acidosis likely in the setting of sepsis Serum bicarb 19 on 05/06/2019 with anion gap of 5>> serum bicarb 24 and AG 9 Received sodium bicarb p.o. 1300 mg twice daily x1 day.  Resolved AKI likely prerenal in the setting of dehydration Baseline creatinine 0.7 with GFR greater than 60 Presented with creatinine of 1.12 with GFR of 57 Appears to be back to her baseline creatinine Received IV fluid hydration Continue to avoid nephrotoxins like NSAIDs Follow-up with your PCP  Hypertension.  Blood pressure is at goal C/ home antihypertensive medications  Follow-up with your PCP  Dyslipidemia.   Continue home medications  Systemic lupus erythematosus. Continue home medications follow-up with your rheumatologist.  Overactive bladder.    Continue home medications.  Follow-up with your urologist.   CODE STATUS: Full code   Discharge Exam: BP  132/75 (BP Location: Right Arm)    Pulse 64    Temp 98.2 F (36.8 C) (Oral)    Resp 18    Ht 4\' 9"  (1.448 m)    Wt 71.2 kg    SpO2 98%    BMI 33.97 kg/m   General: 53 y.o. year-old female well developed well nourished in no acute distress.  Alert and oriented x4.  Cardiovascular: Regular rate and rhythm with no rubs or gallops.  No thyromegaly or JVD noted.    Respiratory: Clear to auscultation with no wheezes or rales. Good inspiratory effort.  Abdomen: Soft nontender nondistended with normal bowel sounds x4 quadrants.  Right nephrostomy tube in place.  Musculoskeletal: No lower extremity edema. 2/4 pulses in all 4 extremities.  Psychiatry: Mood is appropriate for condition and setting  Discharge Instructions You were cared for by a hospitalist during your hospital stay. If you have any questions about your discharge medications or the care you received while you were in the hospital after you are discharged, you can call the unit and asked to speak with the hospitalist on call if the hospitalist that took care of you is not available. Once you are discharged, your primary care physician will handle any further medical issues. Please note that NO REFILLS for any discharge medications will be authorized once you are discharged, as it is imperative that you return to your primary care physician (or establish a relationship with a primary care physician if you do not have one) for  your aftercare needs so that they can reassess your need for medications and monitor your lab values.   Allergies as of 05/09/2019      Reactions   Tramadol Nausea Only   Other reaction(s): Dizziness      Medication List    STOP taking these medications   lisinopril 40 MG tablet Commonly known as: ZESTRIL     TAKE these medications   amLODipine 10 MG tablet Commonly known as: NORVASC Take 10 mg by mouth daily.   atenolol 50 MG tablet Commonly known as: TENORMIN Take 50 mg by mouth daily.     atorvastatin 20 MG tablet Commonly known as: LIPITOR Take 20 mg by mouth daily.   cephALEXin 500 MG capsule Commonly known as: KEFLEX Take 1 capsule (500 mg total) by mouth every 6 (six) hours for 14 days.   escitalopram 10 MG tablet Commonly known as: LEXAPRO Take 10 mg by mouth daily.   folic acid 1 MG tablet Commonly known as: FOLVITE Take 1 mg by mouth daily.   hydroxychloroquine 200 MG tablet Commonly known as: PLAQUENIL Take 200 mg by mouth every other day.   ibuprofen 200 MG tablet Commonly known as: ADVIL Take 400 mg by mouth every 6 (six) hours as needed for pain.   Imuran 50 MG tablet Generic drug: azaTHIOprine Take 50 mg by mouth daily.   oxyCODONE 5 MG immediate release tablet Commonly known as: Oxy IR/ROXICODONE Take 1 tablet (5 mg total) by mouth daily as needed for severe pain or breakthrough pain.      Allergies  Allergen Reactions   Tramadol Nausea Only    Other reaction(s): Dizziness   Follow-up Information    Revelo, Presley Raddle, MD. Go on 05/10/2019.   Specialty: Family Medicine Why: 2pm appointment Contact information: [address] [place] [address] [phone]        Gus Puma. Call in 1 day.   Why: Please call for a post hospital follow-up appointment. Contact information: [address] [facility] [address] [facility] [address] [place] [address] [phone]        Era Bumpers, MD. Go on 06/28/2019.   Specialty: Urology Why:  8:30am appointment Contact information: [address] [place] [address] [phone]            The results of significant diagnostics from this hospitalization (including imaging, microbiology, ancillary and laboratory) are listed below for reference.    Significant Diagnostic Studies: Ct Abdomen Pelvis Wo Contrast  Result Date: 05/04/2019 CLINICAL DATA:  Fever and abdominal pain. EXAM: CT ABDOMEN AND PELVIS WITHOUT CONTRAST TECHNIQUE: Multidetector CT imaging of  the abdomen and pelvis was performed following the standard protocol without IV contrast. COMPARISON:  CT 05/13/2018 FINDINGS: Lower chest: Lung bases are clear. Remote bilateral posterior rib fractures. Borderline cardiomegaly. Hepatobiliary: No focal liver abnormality is seen. Status post cholecystectomy. No biliary dilatation. Pancreas: No ductal dilatation or inflammation. Spleen: Normal in size without focal abnormality. Adrenals/Urinary Tract: No adrenal nodule. Moderate right hydroureteronephrosis. Ureter is dilated to the bladder insertion. Possible but not definite punctate distal ureteric stone at the ureterovesicular junction, series 5, image 53. Moderate right perinephric edema. Punctate nonobstructing stones in the mid lower right kidney. Nonobstructing stones in the left kidney. No left hydronephrosis. Trace left perinephric edema. The left ureter is decompressed. No ureteral stone. Urinary bladder is minimally distended. No bladder stone. Stomach/Bowel: Stomach is unremarkable. No small bowel wall thickening or inflammatory change. No obstruction. Normal appendix. Multifocal colonic diverticulosis,  most prominent in the sigmoid colon. No acute diverticulitis. Vascular/Lymphatic: Mild aortic atherosclerosis. More advanced bi-iliac and branch atherosclerosis. No aneurysm. No bulky abdominopelvic adenopathy. Reproductive: Post hysterectomy. Ovaries tentatively visualized and quiescent. No suspicious adnexal mass. Other: No free air, free fluid, or intra-abdominal fluid collection. 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 Additional degenerative disc disease at L5-S1. There are no acute or suspicious osseous abnormalities. Scattered calcified gluteal calcifications. Remote bilateral posterior rib fractures. IMPRESSION: 1. Moderate right hydroureteronephrosis, ureter dilated to the bladder insertion. Possible but not definite punctate  distal ureteric stone at the ureterovesicular junction. Right perinephric edema may be obstructive or infectious. 2. Additional nonobstructing bilateral nephrolithiasis. 3. Colonic diverticulosis without acute inflammation. Aortic Atherosclerosis (ICD10-I70.0). Electronically Signed   By: Narda Rutherford M.D.   On: 05/04/2019 20:06   Dg Chest 2 View  Result Date: 05/04/2019 CLINICAL DATA:  53 year old female with sepsis. EXAM: CHEST - 2 VIEW COMPARISON:  None. FINDINGS: The lungs are clear. There is no pleural effusion or pneumothorax. The cardiac silhouette is within normal limits. No acute osseous pathology. Right upper quadrant cholecystectomy clips. IMPRESSION: No active cardiopulmonary disease. Electronically Signed   By: Elgie Collard M.D.   On: 05/04/2019 16:56   Dg Or Urology Cysto Image ([facility] Only)  Result Date: 05/04/2019 There is no interpretation for this exam.  This order is for images obtained during a surgical procedure.  Please See "Surgeries" Tab for more information regarding the procedure.   Ct Image Guided Drainage By Percutaneous Catheter  Result Date: 05/05/2019 INDICATION: Sepsis. Right hydronephrosis and hydroureter. Prior history of pelvic surgery with need of ureteral reimplantation. EXAM: Right percutaneous nephrostomy. COMPARISON:  CT 05/04/2019. MEDICATIONS: Patient on Rocephin.; The antibiotic was administered in an appropriate time frame prior to skin puncture. ANESTHESIA/SEDATION: Fentanyl 50 mcg IV; Versed 3 mg IV Moderate Sedation Time:  40 The patient was continuously monitored during the procedure by the interventional radiology nurse under my direct supervision. CONTRAST:  None-administered into the collecting system(s) FLUOROSCOPY TIME:  Fluoroscopy Time: 0 minutes 0 seconds (0 mGy). COMPLICATIONS: None immediate. PROCEDURE: Informed written consent was obtained via an interpreter from the patient after a thorough discussion of the procedural risks, benefits  and alternatives. All questions were addressed. A time-out was called prior to the procedure. The back was sterilely prepped and draped. This procedure had to be performed in CT as the interventional suite was down for maintenance following local anesthesia with 1% lidocaine 22 gauge needle was advanced into a posterior right renal calyx. This followed by placement of an 018 wire. This is followed by placement of a guidewire adduction catheter. This is followed by guidewire exchange for a 035 Amplatz extra stiff wire. This is followed by placement of an 8 French nephrostomy catheter. Confirmation of good placement with obtained with administration of small amount of dilute nonionic contrast in the renal collecting system. The catheter was sutured to the skin and placed to bag drainage. Urine sample sent to pathology. IMPRESSION: Successful right percutaneous nephrostomy. Electronically Signed   By: Maisie Fus  Register   On: 05/05/2019 11:25    Microbiology: Recent Results (from the past 240 hour(s))  Culture, blood (Routine x 2)     Status: Abnormal   Collection Time: 05/04/19  3:51 PM   Specimen: BLOOD  Result Value Ref Range Status   Specimen Description   Final    BLOOD RIGHT ANTECUBITAL Performed at [facility], [address]  [place], [place] [address]    Special Requests   Final    BOTTLES DRAWN AEROBIC AND ANAEROBIC Blood Culture results may not be optimal due to an excessive volume of blood received in culture bottles Performed at [facility], [address] [place], [place] [address]    Culture  Setup Time   Final    GRAM NEGATIVE RODS AEROBIC BOTTLE ONLY CRITICAL RESULT CALLED TO, READ BACK BY AND VERIFIED WITH: ABBY ARRINGTON 05/05/19 @ 1613  MLK Performed at [facility], [address], [place], [place] [address]    Culture ESCHERICHIA COLI (A)  Final   Report Status 05/08/2019 FINAL  Final   Organism ID, Bacteria ESCHERICHIA COLI  Final       Susceptibility   Escherichia coli - MIC*    AMPICILLIN >=32 RESISTANT Resistant     CEFAZOLIN <=4 SENSITIVE Sensitive     CEFEPIME <=1 SENSITIVE Sensitive     CEFTAZIDIME <=1 SENSITIVE Sensitive     CEFTRIAXONE <=1 SENSITIVE Sensitive     CIPROFLOXACIN <=0.25 SENSITIVE Sensitive     GENTAMICIN <=1 SENSITIVE Sensitive     IMIPENEM <=0.25 SENSITIVE Sensitive     TRIMETH/SULFA >=320 RESISTANT Resistant     AMPICILLIN/SULBACTAM 16 INTERMEDIATE Intermediate     PIP/TAZO <=4 SENSITIVE Sensitive     Extended ESBL NEGATIVE Sensitive     * ESCHERICHIA COLI  Urine Culture     Status: Abnormal   Collection Time: 05/04/19  3:51 PM   Specimen: Urine, Clean Catch  Result Value Ref Range Status   Specimen Description   Final    URINE, CLEAN CATCH Performed at [facility], [address] [place], [place] [address]    Special Requests   Final    Normal Performed at [facility], [address] [place], [place] [address]    Culture >=100,000 COLONIES/mL ESCHERICHIA COLI (A)  Final   Report Status 05/07/2019 FINAL  Final   Organism ID, Bacteria ESCHERICHIA COLI (A)  Final      Susceptibility   Escherichia coli - MIC*    AMPICILLIN >=32 RESISTANT Resistant     CEFAZOLIN <=4 SENSITIVE Sensitive     CEFTRIAXONE <=1 SENSITIVE Sensitive     CIPROFLOXACIN <=0.25 SENSITIVE Sensitive     GENTAMICIN <=1 SENSITIVE Sensitive     IMIPENEM <=0.25 SENSITIVE Sensitive     NITROFURANTOIN <=16 SENSITIVE Sensitive     TRIMETH/SULFA >=320 RESISTANT Resistant     AMPICILLIN/SULBACTAM 16 INTERMEDIATE Intermediate     PIP/TAZO <=4 SENSITIVE Sensitive     Extended ESBL NEGATIVE Sensitive     * >=100,000 COLONIES/mL ESCHERICHIA COLI  Blood Culture ID Panel (Reflexed)     Status: Abnormal   Collection Time: 05/04/19  3:51 PM  Result Value Ref Range Status   Enterococcus species NOT DETECTED NOT DETECTED Final   Listeria monocytogenes NOT DETECTED NOT DETECTED Final   Staphylococcus  species NOT DETECTED NOT DETECTED Final   Staphylococcus aureus (BCID) NOT DETECTED NOT DETECTED Final   Streptococcus species NOT DETECTED NOT DETECTED Final   Streptococcus agalactiae NOT DETECTED NOT DETECTED Final   Streptococcus pneumoniae NOT DETECTED NOT DETECTED Final   Streptococcus pyogenes NOT DETECTED NOT DETECTED Final   Acinetobacter baumannii NOT DETECTED NOT DETECTED Final   Enterobacteriaceae species DETECTED (A) NOT DETECTED Final    Comment: Enterobacteriaceae represent a large family of gram-negative bacteria, not a single organism. CRITICAL RESULT CALLED TO, READ BACK BY AND VERIFIED WITH: ABBY ARRINGTON  05/05/19 @ 1613  MLK    Enterobacter cloacae complex NOT DETECTED NOT DETECTED Final   Escherichia coli DETECTED (A) NOT DETECTED Final    Comment: CRITICAL RESULT CALLED TO, READ BACK BY AND VERIFIED WITH: ABBY ARRINGTON 05/05/19 @ 1613  MLK    Klebsiella oxytoca NOT DETECTED NOT DETECTED Final   Klebsiella pneumoniae NOT DETECTED NOT DETECTED Final   Proteus species NOT DETECTED NOT DETECTED Final   Serratia marcescens NOT DETECTED NOT DETECTED Final   Carbapenem resistance NOT DETECTED NOT DETECTED Final   Haemophilus influenzae NOT DETECTED NOT DETECTED Final   Neisseria meningitidis NOT DETECTED NOT DETECTED Final   Pseudomonas aeruginosa NOT DETECTED NOT DETECTED Final   Candida albicans NOT DETECTED NOT DETECTED Final   Candida glabrata NOT DETECTED NOT DETECTED Final   Candida krusei NOT DETECTED NOT DETECTED Final   Candida parapsilosis NOT DETECTED NOT DETECTED Final   Candida tropicalis NOT DETECTED NOT DETECTED Final    Comment: Performed at [facility], [address] [place], [place] [address]  Culture, blood (Routine x 2)     Status: None   Collection Time: 05/04/19  3:54 PM   Specimen: BLOOD  Result Value Ref Range Status   Specimen Description BLOOD LEFT ANTECUBITAL  Final   Special Requests   Final    BOTTLES DRAWN AEROBIC AND  ANAEROBIC Blood Culture adequate volume   Culture   Final    NO GROWTH 5 DAYS Performed at [facility], [address] [place], [place] [address]    Report Status 05/09/2019 FINAL  Final  SARS Coronavirus 2 by RT PCR (hospital order, performed in [facility] hospital lab) Nasopharyngeal Nasopharyngeal Swab     Status: None   Collection Time: 05/04/19  8:30 PM   Specimen: Nasopharyngeal Swab  Result Value Ref Range Status   SARS Coronavirus 2 NEGATIVE NEGATIVE Final    Comment: (NOTE) If result is NEGATIVE SARS-CoV-2 target nucleic acids are NOT DETECTED. The SARS-CoV-2 RNA is generally detectable in upper and lower  respiratory specimens during the acute phase of infection. The lowest  concentration of SARS-CoV-2 viral copies this assay can detect is 250  copies / mL. A negative result does not preclude SARS-CoV-2 infection  and should not be used as the sole basis for treatment or other  patient management decisions.  A negative result may occur with  improper specimen collection / handling, submission of specimen other  than nasopharyngeal swab, presence of viral mutation(s) within the  areas targeted by this assay, and inadequate number of viral copies  (<250 copies / mL). A negative result must be combined with clinical  observations, patient history, and epidemiological information. If result is POSITIVE SARS-CoV-2 target nucleic acids are DETECTED. The SARS-CoV-2 RNA is generally detectable in upper and lower  respiratory specimens dur ing the acute phase of infection.  Positive  results are indicative of active infection with SARS-CoV-2.  Clinical  correlation with patient history and other diagnostic information is  necessary to determine patient infection status.  Positive results do  not rule out bacterial infection or co-infection with other viruses. If result is PRESUMPTIVE POSTIVE SARS-CoV-2 nucleic acids MAY BE PRESENT.   A presumptive positive result was  obtained on the submitted specimen  and confirmed on repeat testing.  While 2019 novel coronavirus  (SARS-CoV-2) nucleic acids may be present in the submitted sample  additional confirmatory testing may be necessary for epidemiological  and / or clinical management purposes  to differentiate between  SARS-CoV-2 and other Sarbecovirus currently known to infect humans.  If clinically indicated additional testing with an alternate test  methodology [phone]) is advised. The SARS-CoV-2 RNA is generally  detectable in upper and lower respiratory sp ecimens during the acute  phase of infection. The expected result is Negative. Fact Sheet for Patients:  BoilerBrush.com.cy Fact Sheet for Healthcare Providers: https://pope.com/ This test is not yet approved or cleared by the [place] FDA and has been authorized for detection and/or diagnosis of SARS-CoV-2 by FDA under an Emergency Use Authorization (EUA).  This EUA will remain in effect (meaning this test can be used) for the duration of the COVID-19 declaration under Section 564(b)(1) of the Act, 21 U.S.C. section 360bbb-3(b)(1), unless the authorization is terminated or revoked sooner. Performed at [facility], [address], [place], [place] [address]   Surgical PCR screen     Status: Abnormal   Collection Time: 05/05/19  6:18 AM   Specimen: Nasal Mucosa; Nasal Swab  Result Value Ref Range Status   MRSA, PCR NEGATIVE NEGATIVE Final   Staphylococcus aureus POSITIVE (A) NEGATIVE Final    Comment: (NOTE) The Xpert SA Assay (FDA approved for NASAL specimens in patients 39 years of age and older), is one component of a comprehensive surveillance program. It is not intended to diagnose infection nor to guide or monitor treatment. Performed at [facility], [address] [place], [place] [address]   Urine culture     Status: None   Collection Time: 05/05/19  8:37  AM   Specimen: In/Out Cath Urine  Result Value Ref Range Status   Specimen Description   Final    IN/OUT CATH URINE Performed at [facility], [address] [place], [place] [address]    Special Requests   Final    NONE Performed at [facility], [address], [place], [place] [address]    Culture   Final    NO GROWTH Performed at [facility], [address] [place], [place] [address]    Report Status 05/06/2019 FINAL  Final  Urine culture     Status: Abnormal   Collection Time: 05/05/19 10:25 AM   Specimen: Urine, Random  Result Value Ref Range Status   Specimen Description   Final    URINE, RANDOM Performed at [facility], [address] [place], [place] [address]    Special Requests   Final    [place] NEPHROSTOMY TUBE Performed at [facility], [address] [place], [place] [address]    Culture 20,000 COLONIES/mL ESCHERICHIA COLI (A)  Final   Report Status 05/07/2019 FINAL  Final   Organism ID, Bacteria ESCHERICHIA COLI (A)  Final      Susceptibility   Escherichia coli - MIC*    AMPICILLIN >=32 RESISTANT Resistant     CEFAZOLIN <=4 SENSITIVE Sensitive     CEFTRIAXONE <=1 SENSITIVE Sensitive     CIPROFLOXACIN <=0.25 SENSITIVE Sensitive     GENTAMICIN <=1 SENSITIVE Sensitive     IMIPENEM <=0.25 SENSITIVE Sensitive     NITROFURANTOIN <=16 SENSITIVE Sensitive     TRIMETH/SULFA >=320 RESISTANT Resistant     AMPICILLIN/SULBACTAM 16 INTERMEDIATE Intermediate     PIP/TAZO <=4 SENSITIVE Sensitive     Extended ESBL NEGATIVE Sensitive     * 20,000 COLONIES/mL ESCHERICHIA COLI  CULTURE, BLOOD (ROUTINE X 2) w Reflex to ID Panel     Status: None (Preliminary result)   Collection Time:  05/06/19  7:12 AM   Specimen: BLOOD  Result Value Ref Range Status   Specimen Description BLOOD RAC  Final   Special Requests   Final    BOTTLES DRAWN AEROBIC AND ANAEROBIC Blood Culture adequate volume   Culture   Final    NO GROWTH 3  DAYS Performed at [facility], [address] [place], [place] [address]    Report Status PENDING  Incomplete  CULTURE, BLOOD (ROUTINE X 2) w Reflex to ID Panel     Status: None (Preliminary result)   Collection Time: 05/06/19  7:14 AM   Specimen: BLOOD  Result Value Ref Range Status   Specimen Description BLOOD [facility]  Final   Special Requests   Final    BOTTLES DRAWN AEROBIC AND ANAEROBIC Blood Culture adequate volume   Culture   Final    NO GROWTH 3 DAYS Performed at [facility], [address] [place], [place] [address]    Report Status PENDING  Incomplete     Labs: Basic Metabolic Panel: Recent Labs  Lab 05/04/19 1551 05/05/19 0454 05/06/19 0714 05/08/19 0600 05/09/19 0601  NA 138 140 138 142 138  K 3.5 3.6 3.6 3.3* 3.7  CL 108 112* 114* 109 109  CO2 19* 18* 19* 24 24  GLUCOSE 100* 157* 112* 89 94  BUN 27* 30* 29* 14 12  CREATININE 1.12* 1.09* 0.73 0.72 0.68  CALCIUM 9.4 8.3* 7.9* 8.6* 8.3*  MG  --   --  2.0 1.6* 2.3   Liver Function Tests: Recent Labs  Lab 05/04/19 1551 05/06/19 0714  AST 25 16  ALT 19 15  ALKPHOS 97 65  BILITOT 0.5 0.2*  PROT 9.2* 7.1  ALBUMIN 3.7 2.6*   No results for input(s): LIPASE, AMYLASE in the last 168 hours. No results for input(s): AMMONIA in the last 168 hours. CBC: Recent Labs  Lab 05/04/19 1551 05/05/19 0454 05/06/19 0714 05/08/19 0600  WBC 9.5 13.5* 10.0 7.0  NEUTROABS 8.1*  --  7.8*  --   HGB 11.5* 10.0* 9.2* 10.1*  HCT 35.4* 31.2* 27.7* 29.9*  MCV 88.7 89.7 86.3 84.5  PLT 213 177 161 198   Cardiac Enzymes: No results for input(s): CKTOTAL, CKMB, CKMBINDEX, TROPONINI in the last 168 hours. BNP: BNP (last 3 results) No results for input(s): BNP in the last 8760 hours.  ProBNP (last 3 results) No results for input(s): PROBNP in the last 8760 hours.  CBG: Recent Labs  Lab 05/04/19 1543 05/05/19 0022  GLUCAP 87 111*       Signed:  Darlin Drop, MD [facility] 05/09/2019, 10:43 AM

## 2019-05-09 NOTE — Discharge Instructions (Signed)
Infeccin urinaria, en adultos Urosepsis, Adult  La infeccin urinaria es un tipo de sepsis. La sepsis es una reaccin grave del cuerpo ante una infeccin. La infeccin urinaria es causada por una infeccin bacteriana que comienza en las vas urinarias y se propaga a Risk manager. Las vas urinarias constituyen el [employer] se produce, se Environmental manager y se elimina la [place], e incluye los riones, los urteres, la vejiga y Engineer, mining. Se lo conoce tambin como sistema urinario. En casos graves, la sepsis puede causar un choque sptico. El choque sptico puede debilitar el corazn y causar una disminucin de la presin arterial. En consecuencia, el sistema nervioso central y otros rganos vitales del cuerpo pueden dejar de funcionar. La infeccin urinaria es un caso de emergencia mdica que requiere tratamiento inmediato en un hospital. [address] son las causas? Las causas ms frecuentes de esta afeccin incluyen las siguientes:  Una infeccin urinaria (IU) que se disemina a Risk manager.  Una obstruccin en las vas urinarias debido a clculos renales.  Hinchazn e inflamacin de la prstata (prostatitis) o una infeccin en la prstata en los hombres. Qu incrementa el riesgo? Es ms probable que tenga esta afeccin si:  Es mujer, especialmente si es sexualmente [place].  Tiene ms de 65aos de edad.  Tiene una enfermedad crnica, como enfermedad renal o diabetes.  Tiene debilitado el sistema que combate las enfermedades (sistema inmunitario).  Tiene una afeccin que reduce o cambia el flujo de [place], como un clculo en los riones o la vejiga, enfermedad de la prstata o un tumor en las vas [place].  Ha tenido una ciruga en la zona de las vas [place].  Tiene un tubo pequeo y delgado en la uretra que drena la orina de la vejiga durante un tiempo (catter urinario [place]).  Ha perdido sensibilidad de la cintura hacia abajo o est en silla de ruedas. Cules son los signos o los  sntomas? Los primeros sntomas de esta afeccin son similares a los sntomas de una infeccin urinaria (IU) grave. Los sntomas frecuentes de esta afeccin incluyen los siguientes:  Dolor en un costado del cuerpo, la espalda o en el abdomen inferior.  Grant Ruts y escalofros.  Nuseas y vmitos.  Necesidad frecuente de Geographical information systems officer.  Dolor urente al [employer].  Mason Jim turbia o con sangre.  Orina con mal olor.  Fatiga.  Dificultad o imposibilidad para orinar. Una vez que la infeccin se propaga a la sangre y se inicia la sepsis, otros sntomas pueden incluir:  Escalofros con Risk manager.  Piel fra y hmeda.  Fiebre de 101.83F (38.5C) o ms.  Temperatura corporal baja de 96.28F (36C) o menos.  Respiracin acelerada.  Dificultad para respirar.  Latidos cardacos acelerados.  Dolor intenso en el abdomen.  Dolores musculares.  Ansiedad.  Problemas para mantenerse despierto.  Confusin.  Desmayos. Cmo se diagnostica? Esta afeccin se diagnostica en funcin de los sntomas, los antecedentes mdicos y un examen fsico. Es posible que tambin le hagan los siguientes estudios:  Anlisis de [place] o anlisis de sangre para Chief Operating Officer la funcin renal y Engineer, manufacturing la presencia de infeccin.  Estudios de diagnstico por imgenes, como una exploracin por tomografa computarizada (TC) o una ecografa para detectar obstrucciones en el sistema urinario. Cmo se trata? Esta afeccin es una emergencia mdica que se debe tratar de inmediato en el hospital. El tratamiento de esta afeccin puede incluir:  Una va intravenosa (IV) para que pueda recibir rpidamente lo siguiente: ? Antibiticos. ? Lquidos. ? Medicamentos para estabilizar la presin  arterial.  Oxgeno y asistencia respiratoria, en caso de ser necesario.  Extraccin del catter urinario si es la fuente de infeccin, si corresponde.  Filtracin de la sangre con una mquina (dilisis). Este Peabody Energyproceso le purifica la sangre  si ha tenido un fallo renal.  Ciruga para drenar las zonas infectadas o restaurar el flujo de [place]gorina. Esto es poco frecuente. Siga estas instrucciones en su casa: Medicamentos  Use los medicamentos de venta libre y los recetados solamente como se lo haya indicado el mdico.  Si le recetaron un antibitico, tmelo como se lo haya indicado el mdico. No deje de usar el antibitico aunque comience a Actorsentirse mejor. Indicaciones generales   Beba suficiente lquido como para mantener la orina de color amarillo plido.  Retome sus actividades normales segn lo indicado por el mdico. Pregntele al mdico qu actividades son seguras para usted.  Concurra a todas las visitas de [address]segu[address] como se lo haya indicado el mdico. Esto es importante. Comunquese con un mdico si:  Tiene sntomas que empeoran o no mejoran con el tratamiento.  Tiene nuevos sntomas de IU. Solicite ayuda inmediatamente si:  Sigue teniendo sntomas o tiene sntomas nuevos de sepsis despus de la hospitalizacin, por ejemplo: ? Fiebre de 101.60F (38.5C) o ms. ? Temperatura corporal baja de 96.29F (36C) o menos. ? Escalofros. ? Dolor intenso. ? Dificultad para respirar. ? Confusin. ? Somnolencia. ? Nuseas y vmitos. Estos sntomas pueden representar un problema grave que constituye Radio broadcast assistantuna emergencia. No espere a ver si los sntomas desaparecen. Solicite atencin mdica de inmediato. Comunquese con el servicio de emergencias de su localidad (911 en los [place]). No conduzca por sus propios medios OfficeMax Incorporatedhasta el hospital. Resumen  La infeccin urinaria es un tipo de sepsis. La sepsis es una reaccin grave del cuerpo ante una infeccin.  La infeccin urinaria es un caso de emergencia mdica que requiere tratamiento inmediato en un hospital.  Las posibles causas de la infeccin urinaria incluyen una infeccin de las vas urinarias que se propaga a la sangre, obstruccin debido a clculos renales e hinchazn  o infeccin de la prstata en los hombres.  Esta afeccin puede tratarse con una va intravenosa (IV) de modo que pueda recibir rpidamente antibiticos, lquidos y medicamentos para Building services engineerestabilizar la presin arterial. Tambin pueden utilizarse otros medicamentos.  Obtenga ayuda de inmediato si sigue teniendo sntomas o tiene sntomas nuevos de sepsis despus de la hospitalizacin. Esta informacin no tiene Theme park managercomo fin reemplazar el consejo del mdico. Asegrese de hacerle al mdico cualquier pregunta que tenga. Document Released: 09/24/2008 Document Revised: 08/09/2018 Document Reviewed: 08/09/2018 Elsevier Patient Education  2020 Elsevier Inc.  Bacteriemia en adultos Bacteremia, Adult La bacteriemia es la presencia de bacterias en la sangre. Cuando las bacterias entran en el torrente sanguneo, pueden causar una reaccin potencialmente mortal denominada sepsis, que es una urgencia mdica. La bacteriemia se puede propagar a otras partes del cuerpo, incluidos el corazn, las articulaciones y Secretary/administratorel cerebro. Cules son las causas? Esta afeccin es causada por las bacterias que ingresan en la [place]re.  Las bacterias pueden entrar en la sangre: ? Por una infeccin o lesin en la piel, como una quemadura o un corte. ? Por una infeccin pulmonar (neumona). ? Por una infeccin en el estmago o los intestinos (infeccin gastrointestinal). ? Por una infeccin en la vejiga o el sistema urinario (infeccin de las vas [place]urinarias). ? Durante un procedimiento dental o mdico. ? Por un sangrado en las encas. ? Cuando una infeccin bacteriana en otra parte de  su cuerpo se propaga a Herbalist. ? A travs de una aguja sucia (contaminada). Qu incrementa el riesgo? Es ms probable que esta afeccin se manifieste en nios, ancianos y Engineer, manufacturing que:  Tienen una enfermedad o afeccin prolongada (crnica) como diabetes o insuficiencia renal crnica.  Tienen una articulacin o vlvula cardaca artificiales.  Tienen  una enfermedad de las vlvulas cardacas.  Tienen un tubo insertado para tratar [employer], como un catter urinario o una va intravenosa.  Tiene debilitado el sistema que combate las enfermedades (sistema inmunitario).  Se inyectan drogas ilegales.  Han estado hospitalizados por ms de [employer]. Cules son los signos o sntomas? Los sntomas de esta afeccin incluyen:  [place].  Escalofros.  Latidos cardacos acelerados.  Falta de aire.  Mareos.  Debilidad.  Confusin.  Nuseas o vmitos.  Diarrea.  Presin arterial baja.  Disminucin de la diuresis. La bacteriemia que se propaga a otras partes del cuerpo puede causar sntomas en esas zonas. En algunos casos no hay sntomas. Cmo se diagnostica? Esta afeccin se Designer, fashion/clothing un examen fsico y [employer] siguientes:  Un hemograma completo. Este anlisis detecta signos de infeccin.  Cultivos de [place]. Estos se usan para comprobar la presencia de bacterias en la sangre.  Pruebas de cualquier tubo que tenga insertado. Estas pruebas se usan para buscar la fuente de infeccin.  Anlisis de [place], incluidos los urocultivos. Estos anlisis se usan para Product manager presencia de bacterias en la orina que podran ser una fuente de infeccin.  Estudios de diagnstico por imgenes, como una radiografa, una exploracin por tomografa computarizada (TC), una resonancia magntica (RM) o Optometrist. Estos se usan para detectar la presencia de una fuente de infeccin en otras partes del cuerpo, como los pulmones, las vlvulas [place] articulaciones. Cmo se trata? Esta afeccin suele tratarse en un hospital. El tratamiento puede implicar lo siguiente:  Antibiticos. Estos pueden administrarse por boca (por va oral) o directamente en el torrente sanguneo a travs de una va intravenosa (infusin a travs de una vena). ? Segn la fuente de infeccin, es posible que necesite  antibiticos durante varias semanas. ? Al principio, se le Administrator, sports un antibitico para Chief Strategy Officer de las bacterias en la sangre (antibitico de amplio espectro). Si los [employer] de los [employer] Corporation que un determinado tipo de bacterias es el que est causando el problema, es posible que le administren un antibitico diferente para destruir esas bacterias especficas.  Lquidos por va intravenosa.  Extraccin de cualquier catter o dispositivo que pudiera ser una fuente de infeccin.  Control de la presin arterial y asistencia respiratoria, si es necesario.  Ciruga para controlar la fuente o la propagacin de la infeccin, como una ciruga para extirpar un dispositivo, un absceso o tejido que se ha infectado.  Visitas de seguimiento para medicamentos, anlisis de sangre y ms evaluaciones. Siga estas indicaciones en su casa: Medicamentos  [employer] de venta libre y los recetados solamente como se lo haya indicado el mdico.  Si le recetaron un antibitico, tmelo como se lo haya indicado el mdico. No deje de tomar el antibitico, aunque comience a Sports administrator. Indicaciones generales   Descanse todo lo que sea necesario. Pregntele al mdico cundo puede retomar sus actividades normales.  Beba suficiente lquido como para Theatre manager la orina de color amarillo plido.  No consuma ningn producto que contenga nicotina o tabaco, como cigarrillos y Psychologist, sport and exercise. Si necesita ayuda para dejar de consumir, consulte  al mdico.  Concurra a todas las visitas de control como se lo haya indicado el mdico. Esto es importante. Cmo se evita?   Lvese las manos con frecuencia con agua y [place]. Use desinfectante para manos si no dispone de [place] y [place].  Debe lavarse las manos: ? Despus de usar el bao o cambiar un paal. ? Antes de preparar, cocinar o servir la comida. ? Mientras cuida de una persona enferma, o cuando visita a alguien [employer]. ? Antes y despus de cambiar vendas (vendajes) de heridas.  Limpie cualquier rasguo o corte con agua y [place], y cbralo con vendajes limpios.  Aplquese las vacunas segn se lo indique el mdico.  Mantenga una buena higiene bucal. Cepllese los dientes dos veces al da y use hilo dental con regularidad.  Cudese bien la piel. Esto incluye baarse y humectarse de forma regular. Solicite ayuda inmediatamente si tiene:  Engineer, mining.  Fiebre o escalofros.  Dificultad para respirar.  Frecuencia cardaca acelerada.  Piel manchada, plida o hmeda.  Confusin.  Debilidad.  Falta de energa (letargo) o somnolencia inusual.  Diarrea.  Nuevos sntomas que aparecen despus de que se ha iniciado Scientist, research (medical). Estos sntomas pueden representar un problema grave que constituye Radio broadcast assistant. No espere a ver si los sntomas desaparecen. Solicite atencin mdica de inmediato. Comunquese con el servicio de emergencias de su localidad (911 en los [place]). No conduzca por sus propios medios OfficeMax Incorporated. Resumen  La bacteriemia es la presencia de bacterias en la [place]. Cuando las bacterias entran en el torrente sanguneo, pueden causar una reaccin potencialmente mortal denominada sepsis.  Algunos sntomas de la bacteriemia incluyen fiebre, escalofros, falta de aire, confusin, nuseas o vmitos y [place].  Se pueden realizar pruebas para hallar la fuente de infeccin causante de la bacteriemia. Estas pruebas pueden incluir anlisis de [place] y de [place], y estudios de diagnstico por imgenes.  Por lo general, la bacteriemia se trata con antibiticos en un hospital.  Solicite ayuda de inmediato si tiene sntomas nuevos que aparecen despus de que el tratamiento ha comenzado. Esta informacin no tiene Theme park manager el consejo del mdico. Asegrese de hacerle al mdico cualquier pregunta que tenga. Document Released: 09/24/2008 Document Revised: 11/26/2017  Document Reviewed: 11/26/2017 Elsevier Patient Education  2020 [employer].

## 2019-05-09 NOTE — Progress Notes (Signed)
Lindsay Dean to be D/C'd Home per MD order.  Discussed prescriptions and follow up appointments with the patient. Prescriptions given to patient, medication list explained in detail. Pt verbalized understanding.  Allergies as of 05/09/2019      Reactions   Tramadol Nausea Only   Other reaction(s): Dizziness      Medication List    STOP taking these medications   lisinopril 40 MG tablet Commonly known as: ZESTRIL     TAKE these medications   amLODipine 10 MG tablet Commonly known as: NORVASC Take 10 mg by mouth daily.   atenolol 50 MG tablet Commonly known as: TENORMIN Take 50 mg by mouth daily.   atorvastatin 20 MG tablet Commonly known as: LIPITOR Take 20 mg by mouth daily.   cephALEXin 500 MG capsule Commonly known as: KEFLEX Take 1 capsule (500 mg total) by mouth every 6 (six) hours for 14 days.   escitalopram 10 MG tablet Commonly known as: LEXAPRO Take 10 mg by mouth daily.   folic acid 1 MG tablet Commonly known as: FOLVITE Take 1 mg by mouth daily.   hydroxychloroquine 200 MG tablet Commonly known as: PLAQUENIL Take 200 mg by mouth every other day.   ibuprofen 200 MG tablet Commonly known as: ADVIL Take 400 mg by mouth every 6 (six) hours as needed for pain.   Imuran 50 MG tablet Generic drug: azaTHIOprine Take 50 mg by mouth daily.   oxyCODONE 5 MG immediate release tablet Commonly known as: Oxy IR/ROXICODONE Take 1 tablet (5 mg total) by mouth daily as needed for severe pain or breakthrough pain.       Vitals:   05/09/19 0452 05/09/19 0833  BP: 127/82 132/75  Pulse: 61 64  Resp: 20 18  Temp: 98.4 F (36.9 C) 98.2 F (36.8 C)  SpO2: 100% 98%    Skin clean, dry and intact without evidence of skin break down, no evidence of skin tears noted. IV catheter discontinued intact. Site without signs and symptoms of complications. Dressing and pressure applied. Pt denies pain at this time. No complaints noted.  An After Visit Summary was  printed and given to the patient. Patient escorted via [place], and D/C home via private auto.  Lindsay Dean  05/09/2019 11:22 AM

## 2019-05-11 LAB — CULTURE, BLOOD (ROUTINE X 2)
Culture: NO GROWTH
Culture: NO GROWTH
Special Requests: ADEQUATE
Special Requests: ADEQUATE

## 2019-07-04 ENCOUNTER — Other Ambulatory Visit: Payer: Self-pay

## 2019-07-04 ENCOUNTER — Emergency Department
Admission: EM | Admit: 2019-07-04 | Discharge: 2019-07-04 | Disposition: A | Discharge status: Home / Self Care | Payer: Medicaid Other | Attending: Emergency Medicine | Admitting: Emergency Medicine

## 2019-07-04 ENCOUNTER — Encounter: Payer: Self-pay | Admitting: Emergency Medicine

## 2019-07-04 ENCOUNTER — Emergency Department: Payer: Medicaid Other

## 2019-07-04 DIAGNOSIS — Z79899 Other long term (current) drug therapy: Secondary | ICD-10-CM | POA: Insufficient documentation

## 2019-07-04 DIAGNOSIS — R109 Unspecified abdominal pain: Secondary | ICD-10-CM

## 2019-07-04 DIAGNOSIS — I10 Essential (primary) hypertension: Secondary | ICD-10-CM | POA: Diagnosis not present

## 2019-07-04 DIAGNOSIS — E119 Type 2 diabetes mellitus without complications: Secondary | ICD-10-CM | POA: Diagnosis not present

## 2019-07-04 LAB — URINALYSIS, COMPLETE (UACMP) WITH MICROSCOPIC
Bacteria, UA: NONE SEEN
Bacteria, UA: NONE SEEN
Bilirubin Urine: NEGATIVE
Bilirubin Urine: NEGATIVE
Glucose, UA: NEGATIVE mg/dL
Glucose, UA: NEGATIVE mg/dL
Ketones, ur: NEGATIVE mg/dL
Ketones, ur: NEGATIVE mg/dL
Nitrite: NEGATIVE
Nitrite: NEGATIVE
Protein, ur: NEGATIVE mg/dL
Protein, ur: 100 mg/dL — AB
RBC / HPF: >50 RBC/hpf — ABNORMAL HIGH (ref 0–5)
Specific Gravity, Urine: 1.014 (ref 1.005–1.030)
Specific Gravity, Urine: >1.046 — ABNORMAL HIGH (ref 1.005–1.030)
Squamous Epithelial / HPF: NONE SEEN (ref 0–5)
WBC, UA: >50 WBC/hpf — ABNORMAL HIGH (ref 0–5)
pH: 5 (ref 5.0–8.0)
pH: 6 (ref 5.0–8.0)

## 2019-07-04 LAB — BASIC METABOLIC PANEL
Anion gap: 9 (ref 5–15)
BUN: 26 mg/dL — ABNORMAL HIGH (ref 6–20)
CO2: 20 mmol/L — ABNORMAL LOW (ref 22–32)
Calcium: 9.1 mg/dL (ref 8.9–10.3)
Chloride: 108 mmol/L (ref 98–111)
Creatinine, Ser: 0.9 mg/dL (ref 0.44–1.00)
GFR calc Af Amer: >60 mL/min (ref >60)
GFR calc non Af Amer: >60 mL/min (ref >60)
Glucose, Bld: 96 mg/dL (ref 70–99)
Potassium: 4 mmol/L (ref 3.5–5.1)
Sodium: 137 mmol/L (ref 135–145)

## 2019-07-04 LAB — CBC
HCT: 32.7 % — ABNORMAL LOW (ref 36.0–46.0)
Hemoglobin: 10.7 g/dL — ABNORMAL LOW (ref 12.0–15.0)
MCH: 29 pg (ref 26.0–34.0)
MCHC: 32.7 g/dL (ref 30.0–36.0)
MCV: 88.6 fL (ref 80.0–100.0)
Platelets: 197 10*3/uL (ref 150–400)
RBC: 3.69 MIL/uL — ABNORMAL LOW (ref 3.87–5.11)
RDW: 13.4 % (ref 11.5–15.5)
WBC: 6.4 10*3/uL (ref 4.0–10.5)
nRBC: 0 % (ref 0.0–0.2)

## 2019-07-04 LAB — LACTIC ACID, PLASMA: Lactic Acid, Venous: 1.5 mmol/L (ref 0.5–1.9)

## 2019-07-04 MED ORDER — IOHEXOL 300 MG/ML  SOLN
100.0000 mL | Freq: Once | INTRAMUSCULAR | Status: AC | PRN
  Administered 2019-07-04: 100 mL via INTRAVENOUS

## 2019-07-04 MED ORDER — KETOROLAC TROMETHAMINE 60 MG/2ML IM SOLN
15.0000 mg | Freq: Once | INTRAMUSCULAR | Status: AC
  Administered 2019-07-04: 15 mg via INTRAMUSCULAR
  Filled 2019-07-04: qty 2

## 2019-07-04 MED ORDER — NAPROXEN 500 MG PO TABS: 500.0000 mg | ORAL_TABLET | Freq: Two times a day (BID) | ORAL | 0 refills | Status: DC

## 2019-07-04 MED ORDER — CEFDINIR 300 MG PO CAPS: 300.0000 mg | ORAL_CAPSULE | Freq: Two times a day (BID) | ORAL | 0 refills | Status: DC

## 2019-07-04 MED ORDER — SULFAMETHOXAZOLE-TRIMETHOPRIM 800-160 MG PO TABS: 1.0000 | ORAL_TABLET | Freq: Two times a day (BID) | ORAL | 0 refills | Status: DC

## 2019-07-04 MED ORDER — OXYCODONE-ACETAMINOPHEN 5-325 MG PO TABS: 1.0000 | ORAL_TABLET | Freq: Four times a day (QID) | ORAL | 0 refills | Status: AC | PRN

## 2019-07-04 MED ORDER — SODIUM CHLORIDE 0.9 % IV SOLN: Freq: Once | INTRAVENOUS | Status: AC

## 2019-07-04 NOTE — ED Notes (Signed)
Per Dr. Scotty Court, obtain urine for nephrostomy bag (was changed yesterday per pt) to lab to be processed since pt urinating into a cup earlier on own.

## 2019-07-04 NOTE — ED Provider Notes (Signed)
[facility] Emergency Department Provider Note  ____________________________________________  Time seen: Approximately 1:18 PM  I have reviewed the triage vital signs and the nursing notes.   HISTORY  Chief Complaint Flank Pain  Encounter completed with Spanish interpreter at bedside  HPI Lindsay Dean is a 54 y.o. female with a history of diabetes hypertension lupus on azathioprine with recently inserted right nephrostomy tube due to ureterolithiasis causing obstruction who comes the ED complaining of right flank pain and blood-tinged urine output in the nephrostomy bag.   Flank pain started yesterday, gradual onset, constant, severe.  Nonradiating, no aggravating or alleviating factors.  Associated with chills but no fevers or body aches.  No shortness of breath or chest pain.  She is seeing a urologist in [place], last saw them on January 6 and has a follow-up appointment on February 2.  Still voiding spontaneously as usual.     Past Medical History:  Diagnosis Date  . Diabetes mellitus without complication (HCC)    taken off meds in 2017  . Hypertension   . Lupus [facility])      Patient Active Problem List   Diagnosis Date Noted  . Sepsis (HCC) 05/04/2019     Past Surgical History:  Procedure Laterality Date  . ABDOMINAL HYSTERECTOMY    . CYSTOSCOPY WITH STENT PLACEMENT Right 05/04/2019   Procedure: CYSTOSCOPY with attempt of stent placement;  Surgeon: Sondra Come, MD;  Location: [facility] ORS;  Service: Urology;  Laterality: Right;     Prior to Admission medications   Medication Sig Start Date End Date Taking? Authorizing Provider  amLODipine (NORVASC) 10 MG tablet Take 10 mg by mouth daily.    [provider]  atenolol (TENORMIN) 50 MG tablet Take 50 mg by mouth daily.    [provider]  atorvastatin (LIPITOR) 20 MG tablet Take 20 mg by mouth daily.    [provider]  azaTHIOprine (IMURAN) 50 MG tablet Take  50 mg by mouth daily.    [provider]  cefdinir (OMNICEF) 300 MG capsule Take 1 capsule (300 mg total) by mouth 2 (two) times daily. 07/04/19   Sharman Cheek, MD  escitalopram (LEXAPRO) 10 MG tablet Take 10 mg by mouth daily.    [provider]  folic acid (FOLVITE) 1 MG tablet Take 1 mg by mouth daily.    [provider]  hydroxychloroquine (PLAQUENIL) 200 MG tablet Take 200 mg by mouth every other day. 03/15/19 03/14/20  [provider]  ibuprofen (ADVIL) 200 MG tablet Take 400 mg by mouth every 6 (six) hours as needed for pain.    [provider]  naproxen (NAPROSYN) 500 MG tablet Take 1 tablet (500 mg total) by mouth 2 (two) times daily with a meal. 07/04/19   Sharman Cheek, MD  oxyCODONE (OXY IR/ROXICODONE) 5 MG immediate release tablet Take 1 tablet (5 mg total) by mouth daily as needed for severe pain or breakthrough pain. 05/09/19   Darlin Drop, DO  oxyCODONE-acetaminophen (PERCOCET) 5-325 MG tablet Take 1 tablet by mouth every 6 (six) hours as needed for severe pain. 07/04/19 07/03/20  Sharman Cheek, MD  sulfamethoxazole-trimethoprim (BACTRIM DS) 800-160 MG tablet Take 1 tablet by mouth 2 (two) times daily. 07/04/19   Sharman Cheek, MD     Allergies Tramadol   No family history on file.  Social History Social History   Tobacco Use  . Smoking status: Never Smoker  . Smokeless tobacco: Never Used  Substance Use Topics  .  Alcohol use: No  . Drug use: No    Review of Systems  Constitutional:   No fever positive chills.  ENT:   No sore throat. No rhinorrhea. Cardiovascular:   No chest pain or syncope. Respiratory:   No dyspnea or cough. Gastrointestinal:   Negative for abdominal pain, vomiting and diarrhea.  Musculoskeletal:   Positive right flank pain as above All other systems reviewed and are negative except as documented above in ROS and HPI.  ____________________________________________   PHYSICAL  EXAM:  VITAL SIGNS: ED Triage Vitals  Enc Vitals Group     BP 07/04/19 0937 (!) 157/84     Pulse Rate 07/04/19 0937 61     Resp 07/04/19 0937 18     Temp 07/04/19 0937 98.6 F (37 C)     Temp Source 07/04/19 0937 Oral     SpO2 07/04/19 0937 100 %     Weight 07/04/19 0943 147 lb (66.7 kg)     Height 07/04/19 0943 4\' 9"  (1.448 m)     Head Circumference --      Peak Flow --      Pain Score 07/04/19 0942 10     Pain Loc --      Pain Edu? --      Excl. in [place]? --     Vital signs reviewed, nursing assessments reviewed.   Constitutional:   Alert and oriented. Non-toxic appearance. Eyes:   Conjunctivae are normal. EOMI. PERRL. ENT      Head:   Normocephalic and atraumatic.      Nose:   Wearing a mask.      Mouth/Throat:   Wearing a mask.      Neck:   No meningismus. Full ROM. Hematological/Lymphatic/Immunilogical:   No cervical lymphadenopathy. Cardiovascular:   RRR. Symmetric bilateral radial and DP pulses.  No murmurs. Cap refill less than 2 seconds. Respiratory:   Normal respiratory effort without tachypnea/retractions. Breath sounds are clear and equal bilaterally. No wheezes/rales/rhonchi. Gastrointestinal:   Soft and nontender. Non distended. There is no CVA tenderness.  No rebound, rigidity, or guarding.  Musculoskeletal:   Normal range of motion in all extremities. No joint effusions.  No lower extremity tenderness.  No edema.  Right mid back has tenderness of the soft tissue musculature around the nephrostomy skin entry site.  There is no erythema purulent drainage or other inflammatory changes.  Not warm to touch.  Catheter output is clear with slight blood tinge. Neurologic:   Normal speech and language.  Motor grossly intact. No acute focal neurologic deficits are appreciated.  Skin:    Skin is warm, dry and intact. No rash noted.  No petechiae, purpura, or bullae.  ____________________________________________    LABS (pertinent positives/negatives) (all labs  ordered are listed, but only abnormal results are displayed) Labs Reviewed  URINALYSIS, COMPLETE (UACMP) WITH MICROSCOPIC - Abnormal; Notable for the following components:      Result Value   Color, Urine STRAW (*)    APPearance CLEAR (*)    Hgb urine dipstick SMALL (*)    Leukocytes,Ua TRACE (*)    All other components within normal limits  BASIC METABOLIC PANEL - Abnormal; Notable for the following components:   CO2 20 (*)    BUN 26 (*)    All other components within normal limits  CBC - Abnormal; Notable for the following components:   RBC 3.69 (*)    Hemoglobin 10.7 (*)    HCT 32.7 (*)    All other  components within normal limits  URINALYSIS, COMPLETE (UACMP) WITH MICROSCOPIC - Abnormal; Notable for the following components:   Color, Urine YELLOW (*)    APPearance HAZY (*)    Specific Gravity, Urine >1.046 (*)    Hgb urine dipstick LARGE (*)    Protein, ur 100 (*)    Leukocytes,Ua LARGE (*)    RBC / HPF >50 (*)    WBC, UA >50 (*)    All other components within normal limits  URINE CULTURE  URINE CULTURE  LACTIC ACID, PLASMA  LACTIC ACID, PLASMA   ____________________________________________   EKG    ____________________________________________    RADIOLOGY  CT ABDOMEN PELVIS W CONTRAST  Result Date: 07/04/2019 CLINICAL DATA:  Right flank pain with abdominal abscess or infection suspected EXAM: CT ABDOMEN AND PELVIS WITH CONTRAST TECHNIQUE: Multidetector CT imaging of the abdomen and pelvis was performed using the standard protocol following bolus administration of intravenous contrast. CONTRAST:  OMNIPAQUE IOHEXOL 300 MG/ML  SOLN COMPARISON:  05/13/2018 FINDINGS: Lower chest:  Mild air trapping at the medial left lower lobe. Hepatobiliary: No focal liver abnormality.Cholecystectomy. No bile duct dilatation. Pancreas: Unremarkable. Spleen: Unremarkable. Adrenals/Urinary Tract: Negative adrenals. Right percutaneous nephrostomy in good position. There is  thickening of soft tissues posterior to the right kidney, including the far posterior diaphragm and the deep musculature of the right chest wall. The subcutaneous fat is infiltrated. No collection is seen in this area to imply catheter leakage. No hydronephrosis on either side. Bilateral renal cortical scarring. Renal calculi measuring up to 4 mm on the left. Stomach/Bowel:  No obstruction. No appendicitis. Vascular/Lymphatic: No acute vascular abnormality. No mass or adenopathy. Reproductive:Hysterectomy. Other: No ascites or pneumoperitoneum. Musculoskeletal: No acute abnormalities. Lumbar spine degeneration with advanced lower lumbar facet degeneration especially at L4-5 where there are retro somatic defects and grade 2 anterolisthesis with advanced spinal and bilateral foraminal stenosis. IMPRESSION: 1. Fat and muscular inflammatory change along the percutaneous nephrostomy catheter from the subcutaneous space to the posterior diaphragmatic leaflets. Please correlate for signs of infection. No abscess or catheter displacement. 2. 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 3. Left nephrolithiasis.  No hydronephrosis on either side. Electronically Signed   By: Marnee Spring M.D.   On: 07/04/2019 11:57    ____________________________________________   PROCEDURES Procedures  ____________________________________________  DIFFERENTIAL DIAGNOSIS   Catheter obstruction, pyelonephritis, cellulitis, perinephric abscess  CLINICAL IMPRESSION / ASSESSMENT AND PLAN / ED COURSE  Medications ordered in the ED: Medications  0.9 %  sodium chloride infusion ( Intravenous New Bag/Given 07/04/19 1301)  iohexol (OMNIPAQUE) 300 MG/ML solution 100 mL (100 mLs Intravenous Contrast Given 07/04/19 1141)  ketorolac (TORADOL) injection 15 mg (15 mg Intramuscular Given 07/04/19 1315)    Pertinent labs & imaging results that were  available during my care of the patient were reviewed by me and considered in my medical decision making (see chart for details).  Lindsay Dean was evaluated in Emergency Department on 07/04/2019 for the symptoms described in the history of present illness. She was evaluated in the context of the global COVID-19 pandemic, which necessitated consideration that the patient might be at risk for infection with the SARS-CoV-2 virus that causes COVID-19. Institutional protocols and algorithms that pertain to the evaluation of patients at risk for COVID-19 are in a state of rapid change based on information released by regulatory bodies including the CDC and federal and state organizations. These policies and algorithms were followed  during the patient's care in the ED.   Patient with nephrostomy tube presents with right flank pain at the site of the catheter.  She is tender in this area as well although there are no frank inflammatory changes.  Vital signs are normal.  She is not septic.  Initial labs and voided urine specimen urinalysis are all unremarkable.  Patient reports that the collection bag for the nephrostomy tube has been changed frequently including this morning.  I will send a urinalysis from the nephrostomy tube collection.  Plan to start antibiotics for cellulitis coverage and have her follow-up with urology.   ----------------------------------------- 1:57 PM on 07/04/2019 -----------------------------------------  Catheter urinalysis shows large leukocytes red cells and white cells without bacteria on microscopy.  Culture sent for that as well.  Given her immunosuppression and pain, I will start on Omnicef and Bactrim, pain control with naproxen and a limited prescription for Percocet.  She has no intrinsic renal disease at this time, a limited course of NSAIDs should be safe.  Close follow-up with PCP and urology.     ____________________________________________   FINAL CLINICAL  IMPRESSION(S) / ED DIAGNOSES    Final diagnoses:  Right flank pain     ED Discharge Orders         Ordered    naproxen (NAPROSYN) 500 MG tablet  2 times daily with meals,   Status:  Discontinued     07/04/19 1354    oxyCODONE-acetaminophen (PERCOCET) 5-325 MG tablet  Every 6 hours PRN     07/04/19 1354    cefdinir (OMNICEF) 300 MG capsule  2 times daily,   Status:  Discontinued     07/04/19 1354    sulfamethoxazole-trimethoprim (BACTRIM DS) 800-160 MG tablet  2 times daily,   Status:  Discontinued     07/04/19 1354    cefdinir (OMNICEF) 300 MG capsule  2 times daily     07/04/19 1356    naproxen (NAPROSYN) 500 MG tablet  2 times daily with meals     07/04/19 1356    sulfamethoxazole-trimethoprim (BACTRIM DS) 800-160 MG tablet  2 times daily     07/04/19 1356          Portions of this note were generated with dragon dictation software. Dictation errors may occur despite best attempts at proofreading.   Sharman Cheek, MD 07/04/19 1358

## 2019-07-04 NOTE — ED Notes (Signed)
Interpreter used for assessment. Nephrostomy tube noted to R side. Appears to be draining well. C/o of pain at site. Denies fever, states chills. Pain has been ongoing but was able to control with tylenol but got worse last night. Pt states she's urinating as normal. Urologist is in [place], saw 06/28/19. Next appt is 07/25/19.   A&O. Pt is tender to touch, appears uncomfortable.

## 2019-07-04 NOTE — Discharge Instructions (Addendum)
Your lab test today were okay.  Your CT scan shows some inflammation at your back where the nephrostomy tube passes through.  He may be developing some infection of this area so take the antibiotics as prescribed and follow-up with your doctor.

## 2019-07-04 NOTE — ED Triage Notes (Addendum)
Pt here with c/o right flank pain that began a day ago, pain much more severe last night, nephrostomy noted to area, was placed in November, pt denies any unusual drainage from site. Denies V, D, however does have nausea. States she has been afebrile.

## 2019-07-07 LAB — URINE CULTURE
Culture: 30000 — AB
Culture: >=100000 — AB

## 2020-05-20 ENCOUNTER — Encounter: Payer: Self-pay | Admitting: Emergency Medicine

## 2020-05-20 ENCOUNTER — Emergency Department
Admission: EM | Admit: 2020-05-20 | Discharge: 2020-05-20 | Disposition: A | Discharge status: Home / Self Care | Payer: Medicaid Other | Attending: Emergency Medicine | Admitting: Emergency Medicine

## 2020-05-20 ENCOUNTER — Other Ambulatory Visit: Payer: Self-pay

## 2020-05-20 DIAGNOSIS — Z79899 Other long term (current) drug therapy: Secondary | ICD-10-CM | POA: Insufficient documentation

## 2020-05-20 DIAGNOSIS — R300 Dysuria: Secondary | ICD-10-CM | POA: Diagnosis present

## 2020-05-20 DIAGNOSIS — E119 Type 2 diabetes mellitus without complications: Secondary | ICD-10-CM | POA: Diagnosis not present

## 2020-05-20 DIAGNOSIS — I10 Essential (primary) hypertension: Secondary | ICD-10-CM | POA: Insufficient documentation

## 2020-05-20 DIAGNOSIS — N3001 Acute cystitis with hematuria: Secondary | ICD-10-CM | POA: Insufficient documentation

## 2020-05-20 LAB — CBC
HCT: 39 % (ref 36.0–46.0)
Hemoglobin: 12.5 g/dL (ref 12.0–15.0)
MCH: 29.5 pg (ref 26.0–34.0)
MCHC: 32.1 g/dL (ref 30.0–36.0)
MCV: 92 fL (ref 80.0–100.0)
Platelets: 195 10*3/uL (ref 150–400)
RBC: 4.24 MIL/uL (ref 3.87–5.11)
RDW: 13.2 % (ref 11.5–15.5)
WBC: 8.9 10*3/uL (ref 4.0–10.5)
nRBC: 0 % (ref 0.0–0.2)

## 2020-05-20 LAB — URINALYSIS, COMPLETE (UACMP) WITH MICROSCOPIC
Bacteria, UA: NONE SEEN
Bilirubin Urine: NEGATIVE
Glucose, UA: NEGATIVE mg/dL
Ketones, ur: NEGATIVE mg/dL
Nitrite: NEGATIVE
Protein, ur: NEGATIVE mg/dL
RBC / HPF: >50 RBC/hpf — ABNORMAL HIGH (ref 0–5)
Specific Gravity, Urine: 1.015 (ref 1.005–1.030)
WBC, UA: >50 WBC/hpf — ABNORMAL HIGH (ref 0–5)
pH: 5 (ref 5.0–8.0)

## 2020-05-20 LAB — BASIC METABOLIC PANEL
Anion gap: 8 (ref 5–15)
BUN: 19 mg/dL (ref 6–20)
CO2: 23 mmol/L (ref 22–32)
Calcium: 8.9 mg/dL (ref 8.9–10.3)
Chloride: 107 mmol/L (ref 98–111)
Creatinine, Ser: 0.84 mg/dL (ref 0.44–1.00)
GFR, Estimated: >60 mL/min (ref >60)
Glucose, Bld: 103 mg/dL — ABNORMAL HIGH (ref 70–99)
Potassium: 3.9 mmol/L (ref 3.5–5.1)
Sodium: 138 mmol/L (ref 135–145)

## 2020-05-20 MED ORDER — CEFTRIAXONE SODIUM 1 G IJ SOLR
1.0000 g | Freq: Once | INTRAMUSCULAR | Status: AC
  Administered 2020-05-20: 1 g via INTRAMUSCULAR
  Filled 2020-05-20: qty 10

## 2020-05-20 MED ORDER — LEVOFLOXACIN 750 MG PO TABS: 750.0000 mg | ORAL_TABLET | Freq: Every day | ORAL | 0 refills | Status: AC

## 2020-05-20 MED ORDER — LEVOFLOXACIN 500 MG PO TABS
500.0000 mg | ORAL_TABLET | Freq: Once | ORAL | Status: AC
  Administered 2020-05-20: 500 mg via ORAL
  Filled 2020-05-20: qty 1

## 2020-05-20 NOTE — ED Triage Notes (Signed)
Pt to ED from home c/o urinary frequency, burning with urination and pressure feeling like not emptying for 3 days.  Pt states chills at the moment but denies fevers, n/v/d at home.

## 2020-05-20 NOTE — ED Notes (Signed)
Pt received discharge instructions via interpreter Dois Davenport [phone]).

## 2020-05-20 NOTE — ED Provider Notes (Signed)
[facility] Emergency Department Provider Note  ____________________________________________  Time seen: Approximately 9:48 PM  I have reviewed the triage vital signs and the nursing notes.   HISTORY  Chief Complaint Urinary Tract Infection    HPI Lindsay Dean is a 54 y.o. female who presents the emergency department complaining of dysuria, polyuria x3 days.  Patient states that she feels like she has incomplete bladder emptying and urinary urgency.  Patient with a history of recurrent UTIs, states that she has some ureteral stricture, does have frequent urinary tract infections.  This feels similar.  She states that she has felt chilled but no fevers.  She has had some nausea but no vomiting.  She denies any other complaints at this time.  She does have a history of diabetes but was stopped on her diabetic medications, has a history of hypertension and lupus.  Patient denies any flank pain.  No hematuria.         Past Medical History:  Diagnosis Date   Diabetes mellitus without complication (HCC)    taken off meds in 2017   Hypertension    Lupus [facility])     Patient Active Problem List   Diagnosis Date Noted   Sepsis (HCC) 05/04/2019    Past Surgical History:  Procedure Laterality Date   ABDOMINAL HYSTERECTOMY     CYSTOSCOPY WITH STENT PLACEMENT Right 05/04/2019   Procedure: CYSTOSCOPY with attempt of stent placement;  Surgeon: Sondra Come, MD;  Location: [facility] ORS;  Service: Urology;  Laterality: Right;    Prior to Admission medications   Medication Sig Start Date End Date Taking? Authorizing Provider  amLODipine (NORVASC) 10 MG tablet Take 10 mg by mouth daily.    [provider]  atenolol (TENORMIN) 50 MG tablet Take 50 mg by mouth daily.    [provider]  atorvastatin (LIPITOR) 20 MG tablet Take 20 mg by mouth daily.    [provider]  azaTHIOprine (IMURAN) 50 MG tablet Take 50 mg by mouth daily.     [provider]  cefdinir (OMNICEF) 300 MG capsule Take 1 capsule (300 mg total) by mouth 2 (two) times daily. 07/04/19   Sharman Cheek, MD  escitalopram (LEXAPRO) 10 MG tablet Take 10 mg by mouth daily.    [provider]  folic acid (FOLVITE) 1 MG tablet Take 1 mg by mouth daily.    [provider]  ibuprofen (ADVIL) 200 MG tablet Take 400 mg by mouth every 6 (six) hours as needed for pain.    [provider]  levofloxacin (LEVAQUIN) 750 MG tablet Take 1 tablet (750 mg total) by mouth daily for 7 days. 05/20/20 05/27/20  Elliott Quade, Delorise Royals, PA-C  naproxen (NAPROSYN) 500 MG tablet Take 1 tablet (500 mg total) by mouth 2 (two) times daily with a meal. 07/04/19   Sharman Cheek, MD  oxyCODONE (OXY IR/ROXICODONE) 5 MG immediate release tablet Take 1 tablet (5 mg total) by mouth daily as needed for severe pain or breakthrough pain. 05/09/19   Darlin Drop, DO  oxyCODONE-acetaminophen (PERCOCET) 5-325 MG tablet Take 1 tablet by mouth every 6 (six) hours as needed for severe pain. 07/04/19 07/03/20  Sharman Cheek, MD  sulfamethoxazole-trimethoprim (BACTRIM DS) 800-160 MG tablet Take 1 tablet by mouth 2 (two) times daily. 07/04/19   Sharman Cheek, MD    Allergies Tramadol  History reviewed. No pertinent family history.  Social History Social History   Tobacco Use   Smoking status: Never  Smoker   Smokeless tobacco: Never Used  Substance Use Topics   Alcohol use: No   Drug use: No     Review of Systems  Constitutional: No fever/chills Eyes: No visual changes. No discharge ENT: No upper respiratory complaints. Cardiovascular: no chest pain. Respiratory: no cough. No SOB. Gastrointestinal: No abdominal pain.  No nausea, no vomiting.  No diarrhea.  No constipation. Genitourinary: Positive for dysuria, polyuria, urinary urgency.  Negative for hematuria. Musculoskeletal: Negative for musculoskeletal pain. Skin: Negative for rash,  abrasions, lacerations, ecchymosis. Neurological: Negative for headaches, focal weakness or numbness.  10 System ROS otherwise negative.  ____________________________________________   PHYSICAL EXAM:  VITAL SIGNS: ED Triage Vitals  Enc Vitals Group     BP 05/20/20 1941 (!) 181/109     Pulse Rate 05/20/20 1941 (!) 106     Resp 05/20/20 1941 20     Temp 05/20/20 1941 99.7 F (37.6 C)     Temp Source 05/20/20 1941 Oral     SpO2 05/20/20 1941 100 %     Weight 05/20/20 1941 150 lb (68 kg)     Height 05/20/20 1941 4\' 9"  (1.448 m)     Head Circumference --      Peak Flow --      Pain Score 05/20/20 1950 9     Pain Loc --      Pain Edu? --      Excl. in [place]? --      Constitutional: Alert and oriented. Well appearing and in no acute distress. Eyes: Conjunctivae are normal. PERRL. EOMI. Head: Atraumatic. ENT:      Ears:       Nose: No congestion/rhinnorhea.      Mouth/Throat: Mucous membranes are moist.  Neck: No stridor.    Cardiovascular: Normal rate, regular rhythm. Normal S1 and S2.  Good peripheral circulation. Respiratory: Normal respiratory effort without tachypnea or retractions. Lungs CTAB. Good air entry to the bases with no decreased or absent breath sounds. Gastrointestinal: No external abdominal findings.  Bowel sounds 4 quadrants. Soft and nontender to palpation all quadrants including the suprapubic region.. No guarding or rigidity. No palpable masses. No distention. No CVA tenderness. Musculoskeletal: Full range of motion to all extremities. No gross deformities appreciated. Neurologic:  Normal speech and language. No gross focal neurologic deficits are appreciated.  Skin:  Skin is warm, dry and intact. No rash noted. Psychiatric: Mood and affect are normal. Speech and behavior are normal. Patient exhibits appropriate insight and judgement.   ____________________________________________   LABS (all labs ordered are listed, but only abnormal results are  displayed)  Labs Reviewed  URINALYSIS, COMPLETE (UACMP) WITH MICROSCOPIC - Abnormal; Notable for the following components:      Result Value   Color, Urine YELLOW (*)    APPearance HAZY (*)    Hgb urine dipstick MODERATE (*)    Leukocytes,Ua SMALL (*)    RBC / HPF >50 (*)    WBC, UA >50 (*)    All other components within normal limits  BASIC METABOLIC PANEL - Abnormal; Notable for the following components:   Glucose, Bld 103 (*)    All other components within normal limits  URINE CULTURE  CBC   ____________________________________________  EKG   ____________________________________________  RADIOLOGY   No results found.  ____________________________________________    PROCEDURES  Procedure(s) performed:    Procedures    Medications  cefTRIAXone (ROCEPHIN) injection 1 g (has no administration in time range)  levofloxacin (LEVAQUIN) tablet 500 mg (  has no administration in time range)     ____________________________________________   INITIAL IMPRESSION / ASSESSMENT AND PLAN / ED COURSE  Pertinent labs & imaging results that were available during my care of the patient were reviewed by me and considered in my medical decision making (see chart for details).  Review of the [place] CSRS was performed in accordance of the [facility] prior to dispensing any controlled drugs.           Patient's diagnosis is consistent with urinary tract infection.  Patient presented to the emergency department dysuria, polyuria, urgency.  Patient has a history of recurrent UTIs.  States that this feels similar.  She denies any flank pain, fevers or chills.  Patient's vital signs are stable with no fever here in the emergency department.  Labs are reassuring.  Patient does have findings on urinalysis consistent with UTI.  Patient has had a urinary culture from 07/04/2019 which revealed E. coli and Enterococcus faecalis.  Patient also had stenotrophomonas maltophilia identified as well.   These changes were sensitive to Levaquin at that time.  Given similar symptoms today, I will treat the patient with Rocephin as well as Levaquin at home.  Patient has no signs of sepsis or pyelonephritis on labs, vitals, physical exam. Patient is stable for discharge. Follow-up with primary care. Return precautions discussed with the patient.  Patient is given ED precautions to return to the ED for any worsening or new symptoms.     ____________________________________________  FINAL CLINICAL IMPRESSION(S) / ED DIAGNOSES  Final diagnoses:  Acute cystitis with hematuria      NEW MEDICATIONS STARTED DURING THIS VISIT:  ED Discharge Orders         Ordered    levofloxacin (LEVAQUIN) 750 MG tablet  Daily        05/20/20 2201              This chart was dictated using voice recognition software/Dragon. Despite best efforts to proofread, errors can occur which can change the meaning. Any change was purely unintentional.    Racheal Patches, PA-C 05/20/20 2208    Phineas Semen, MD 05/20/20 2230

## 2020-05-21 ENCOUNTER — Other Ambulatory Visit: Payer: Self-pay | Admitting: Family Medicine

## 2020-05-21 DIAGNOSIS — Z1231 Encounter for screening mammogram for malignant neoplasm of breast: Secondary | ICD-10-CM

## 2020-05-23 LAB — URINE CULTURE: Culture: 50000 — AB

## 2020-07-11 ENCOUNTER — Other Ambulatory Visit: Payer: Medicaid Other

## 2020-08-02 ENCOUNTER — Other Ambulatory Visit: Payer: Self-pay

## 2020-08-02 ENCOUNTER — Ambulatory Visit
Admission: RE | Admit: 2020-08-02 | Discharge: 2020-08-02 | Disposition: A | Discharge status: Home / Self Care | Payer: Medicaid Other | Source: Ambulatory Visit | Attending: Family Medicine | Admitting: Family Medicine

## 2020-08-02 DIAGNOSIS — Z1231 Encounter for screening mammogram for malignant neoplasm of breast: Secondary | ICD-10-CM | POA: Insufficient documentation

## 2020-10-28 ENCOUNTER — Other Ambulatory Visit (INDEPENDENT_AMBULATORY_CARE_PROVIDER_SITE_OTHER): Payer: Self-pay | Admitting: Vascular Surgery

## 2020-10-28 ENCOUNTER — Encounter (INDEPENDENT_AMBULATORY_CARE_PROVIDER_SITE_OTHER): Payer: Self-pay | Admitting: Vascular Surgery

## 2020-10-28 ENCOUNTER — Other Ambulatory Visit: Payer: Self-pay

## 2020-10-28 ENCOUNTER — Ambulatory Visit (INDEPENDENT_AMBULATORY_CARE_PROVIDER_SITE_OTHER): Payer: Medicaid Other | Admitting: Vascular Surgery

## 2020-10-28 ENCOUNTER — Ambulatory Visit (INDEPENDENT_AMBULATORY_CARE_PROVIDER_SITE_OTHER): Payer: Medicaid Other

## 2020-10-28 VITALS — BP 152/90 | HR 76 | Resp 16 | Wt 147.8 lb

## 2020-10-28 DIAGNOSIS — I10 Essential (primary) hypertension: Secondary | ICD-10-CM

## 2020-10-28 DIAGNOSIS — I739 Peripheral vascular disease, unspecified: Secondary | ICD-10-CM

## 2020-10-28 DIAGNOSIS — I7090 Unspecified atherosclerosis: Secondary | ICD-10-CM

## 2020-10-28 DIAGNOSIS — M549 Dorsalgia, unspecified: Secondary | ICD-10-CM

## 2020-10-28 DIAGNOSIS — E669 Obesity, unspecified: Secondary | ICD-10-CM | POA: Insufficient documentation

## 2020-10-28 NOTE — Progress Notes (Signed)
MRN : [id number]  Lindsay Dean is a 55 y.o. (1965/10/27) female who presents with chief complaint of  Chief Complaint  Patient presents with  . New Patient (Initial Visit)    Ref Rosita Kea extensive calcification of femoral/popliteal arteries  .  History of Present Illness:   The patient is seen for evaluation of painful lower extremities. Patient notes the pain is variable and not always associated with activity.  The pain is somewhat consistent day to day occurring on most days. The patient notes the pain also occurs with standing and routinely seems worse as the day wears on. The pain has been progressive over the past several years. The patient states these symptoms are causing  a profound negative impact on quality of life and daily activities.  The patient denies rest pain or dangling of an extremity off the side of the bed during the night for relief. No open wounds or sores at this time. No history of DVT or phlebitis. No prior interventions or surgeries.  There is a  history of back problems and DJD of the lumbar and sacral spine.   ABI's Rt=1.07 and Lt=1.13 (triphasic signals bilaterally)   Current Meds  Medication Sig  . acetaminophen (TYLENOL) 500 MG tablet Take 1,000 mg by mouth every 8 (eight) hours as needed.  Marland Kitchen amLODipine (NORVASC) 10 MG tablet Take 10 mg by mouth daily.  Marland Kitchen atenolol (TENORMIN) 50 MG tablet Take 50 mg by mouth daily.  Marland Kitchen atorvastatin (LIPITOR) 20 MG tablet Take 20 mg by mouth daily.  Marland Kitchen azaTHIOprine (IMURAN) 50 MG tablet Take 50 mg by mouth daily.  . folic acid (FOLVITE) 1 MG tablet Take 1 mg by mouth daily.  Marland Kitchen ibuprofen (ADVIL) 200 MG tablet Take 400 mg by mouth every 6 (six) hours as needed for pain.  Marland Kitchen lisinopril (ZESTRIL) 40 MG tablet Take 40 mg by mouth daily.  . meclizine (ANTIVERT) 25 MG tablet Take 25 mg by mouth 2 (two) times daily as needed.  Marland Kitchen omeprazole (PRILOSEC) 40 MG capsule Take 40 mg by mouth daily.  . predniSONE (STERAPRED  UNI-PAK 21 TAB) 10 MG (21) TBPK tablet Take by mouth. 6,5,4,3,2,1,off    Past Medical History:  Diagnosis Date  . Diabetes mellitus without complication (HCC)    taken off meds in 2017  . Hypertension   . Lupus [facility])     Past Surgical History:  Procedure Laterality Date  . ABDOMINAL HYSTERECTOMY    . CYSTOSCOPY WITH STENT PLACEMENT Right 05/04/2019   Procedure: CYSTOSCOPY with attempt of stent placement;  Surgeon: Sondra Come, MD;  Location: [facility] ORS;  Service: Urology;  Laterality: Right;    Social History Social History   Tobacco Use  . Smoking status: Never Smoker  . Smokeless tobacco: Never Used  Substance Use Topics  . Alcohol use: No  . Drug use: No    Family History Family History  Problem Relation Age of Onset  . Diabetes Mother   . Heart disease Mother   . Hypertension Mother   . Arthritis Mother   . Hypertension Father   . Lung disease Father   No family history of bleeding/clotting disorders, porphyria or autoimmune disease   Allergies  Allergen Reactions  . Tramadol Nausea Only    Other reaction(s): Dizziness     REVIEW OF SYSTEMS (Negative unless checked)  Constitutional: [] Weight loss  [] Fever  [] Chills Cardiac: [] Chest pain   [] Chest pressure   [] Palpitations   [] Shortness of breath when  laying flat   [] Shortness of breath with exertion. Vascular:  [] Pain in legs with walking   [] Pain in legs at rest  [] History of DVT   [] Phlebitis   [] Swelling in legs   [] Varicose veins   [] Non-healing ulcers Pulmonary:   [] Uses home oxygen   [] Productive cough   [] Hemoptysis   [] Wheeze  [] COPD   [] Asthma Neurologic:  [] Dizziness   [] Seizures   [] History of stroke   [] History of TIA  [] Aphasia   [] Vissual changes   [] Weakness or numbness in arm   [] Weakness or numbness in leg Musculoskeletal:   [] Joint swelling   [x] Joint pain   [x] Low back pain Hematologic:  [] Easy bruising  [] Easy bleeding   [] Hypercoagulable state   [] Anemic Gastrointestinal:   [] Diarrhea   [] Vomiting  [] Gastroesophageal reflux/heartburn   [] Difficulty swallowing. Genitourinary:  [] Chronic kidney disease   [] Difficult urination  [] Frequent urination   [] Blood in urine Skin:  [] Rashes   [] Ulcers  Psychological:  [] History of anxiety   []  History of major depression.  Physical Examination  Vitals:   10/28/20 1428  BP: (!) 152/90  Pulse: 76  Resp: 16  Weight: 147 lb 12.8 oz (67 kg)   Body mass index is 31.98 kg/m. Gen: WD/WN, NAD Head: [place]/AT, No temporalis wasting.  Ear/Nose/Throat: Hearing grossly intact, nares w/o erythema or drainage, poor dentition Eyes: PER, EOMI, sclera nonicteric.  Neck: Supple, no masses.  No bruit or JVD.  Pulmonary:  Good air movement, clear to auscultation bilaterally, no use of accessory muscles.  Cardiac: RRR, normal S1, S2, no Murmurs. Vascular:  Vessel Right Left  Radial Palpable Palpable  PT Palpable Palpable  DP Trace Palpable Trace Palpable  Gastrointestinal: soft, non-distended. No guarding/no peritoneal signs.  Musculoskeletal: M/S 5/5 throughout.  No deformity or atrophy.  Neurologic: CN 2-12 intact. Pain and light touch intact in extremities.  Symmetrical.  Speech is fluent. Motor exam as listed above. Psychiatric: Judgment intact, Mood & affect appropriate for pt's clinical situation. Dermatologic: No rashes or ulcers noted.  No changes consistent with cellulitis. Lymph : No Cervical lymphadenopathy, no lichenification or skin changes of chronic lymphedema.  CBC Lab Results  Component Value Date   WBC 8.9 05/20/2020   HGB 12.5 05/20/2020   HCT 39.0 05/20/2020   MCV 92.0 05/20/2020   PLT 195 05/20/2020    BMET    Component Value Date/Time   NA 138 05/20/2020 1956   K 3.9 05/20/2020 1956   CL 107 05/20/2020 1956   CO2 23 05/20/2020 1956   GLUCOSE 103 (H) 05/20/2020 1956   BUN 19 05/20/2020 1956   CREATININE 0.84 05/20/2020 1956   CALCIUM 8.9 05/20/2020 1956   GFRNONAA >60 05/20/2020 1956   GFRAA >60  07/04/2019 0953   CrCl cannot be calculated (Patient's most recent lab result is older than the maximum 21 days allowed.).  COAG Lab Results  Component Value Date   INR 1.1 05/04/2019    Radiology No results found.   Assessment/Plan 1. PAD (peripheral artery disease) (HCC) Recommend:  I do not find evidence of life style limiting vascular disease. The patient specifically denies life style limitation.  Previous noninvasive studies including ABI's of the legs do not identify critical vascular problems.  The patient should continue walking and begin a more formal exercise program. The patient should continue his antiplatelet therapy and aggressive treatment of the lipid abnormalities.  - VAS ABI WITH/WO TBI; Future  2. Primary hypertension Continue antihypertensive medications as already ordered,  these medications have been reviewed and there are no changes at this time.   3. Back pain, unspecified back location, unspecified back pain laterality, unspecified chronicity Continue NSAID medications as already ordered, these medications have been reviewed and there are no changes at this time.  Continued activity and therapy was stressed.     Levora Dredge, MD  10/28/2020 4:41 PM

## 2021-10-06 ENCOUNTER — Encounter: Payer: Self-pay | Admitting: Emergency Medicine

## 2021-10-06 ENCOUNTER — Other Ambulatory Visit: Payer: Self-pay

## 2021-10-06 ENCOUNTER — Inpatient Hospital Stay
Admission: EM | Admit: 2021-10-06 | Discharge: 2021-10-08 | DRG: 660 | Disposition: A | Discharge status: Home / Self Care | Payer: Medicaid Other | Attending: Internal Medicine | Admitting: Internal Medicine

## 2021-10-06 ENCOUNTER — Emergency Department: Payer: Medicaid Other

## 2021-10-06 DIAGNOSIS — IMO0002 Reserved for concepts with insufficient information to code with codable children: Secondary | ICD-10-CM | POA: Diagnosis present

## 2021-10-06 DIAGNOSIS — N12 Tubulo-interstitial nephritis, not specified as acute or chronic: Secondary | ICD-10-CM

## 2021-10-06 DIAGNOSIS — Z888 Allergy status to other drugs, medicaments and biological substances status: Secondary | ICD-10-CM | POA: Diagnosis not present

## 2021-10-06 DIAGNOSIS — Z833 Family history of diabetes mellitus: Secondary | ICD-10-CM

## 2021-10-06 DIAGNOSIS — N200 Calculus of kidney: Secondary | ICD-10-CM | POA: Diagnosis present

## 2021-10-06 DIAGNOSIS — Z6833 Body mass index (BMI) 33.0-33.9, adult: Secondary | ICD-10-CM

## 2021-10-06 DIAGNOSIS — N136 Pyonephrosis: Principal | ICD-10-CM | POA: Diagnosis present

## 2021-10-06 DIAGNOSIS — I10 Essential (primary) hypertension: Secondary | ICD-10-CM | POA: Diagnosis present

## 2021-10-06 DIAGNOSIS — Z87442 Personal history of urinary calculi: Secondary | ICD-10-CM

## 2021-10-06 DIAGNOSIS — E669 Obesity, unspecified: Secondary | ICD-10-CM | POA: Diagnosis present

## 2021-10-06 DIAGNOSIS — K219 Gastro-esophageal reflux disease without esophagitis: Secondary | ICD-10-CM | POA: Diagnosis present

## 2021-10-06 DIAGNOSIS — Z90710 Acquired absence of both cervix and uterus: Secondary | ICD-10-CM

## 2021-10-06 DIAGNOSIS — N201 Calculus of ureter: Secondary | ICD-10-CM | POA: Diagnosis not present

## 2021-10-06 DIAGNOSIS — Z87440 Personal history of urinary (tract) infections: Secondary | ICD-10-CM | POA: Diagnosis not present

## 2021-10-06 DIAGNOSIS — M329 Systemic lupus erythematosus, unspecified: Secondary | ICD-10-CM | POA: Diagnosis present

## 2021-10-06 DIAGNOSIS — Z8261 Family history of arthritis: Secondary | ICD-10-CM | POA: Diagnosis not present

## 2021-10-06 DIAGNOSIS — N3000 Acute cystitis without hematuria: Principal | ICD-10-CM | POA: Diagnosis present

## 2021-10-06 DIAGNOSIS — Z79899 Other long term (current) drug therapy: Secondary | ICD-10-CM | POA: Diagnosis not present

## 2021-10-06 DIAGNOSIS — Z9049 Acquired absence of other specified parts of digestive tract: Secondary | ICD-10-CM

## 2021-10-06 DIAGNOSIS — Z8249 Family history of ischemic heart disease and other diseases of the circulatory system: Secondary | ICD-10-CM

## 2021-10-06 DIAGNOSIS — E119 Type 2 diabetes mellitus without complications: Secondary | ICD-10-CM | POA: Diagnosis present

## 2021-10-06 LAB — CBC
HCT: 34.4 % — ABNORMAL LOW (ref 36.0–46.0)
Hemoglobin: 11.2 g/dL — ABNORMAL LOW (ref 12.0–15.0)
MCH: 30.1 pg (ref 26.0–34.0)
MCHC: 32.6 g/dL (ref 30.0–36.0)
MCV: 92.5 fL (ref 80.0–100.0)
Platelets: 223 10*3/uL (ref 150–400)
RBC: 3.72 MIL/uL — ABNORMAL LOW (ref 3.87–5.11)
RDW: 13.2 % (ref 11.5–15.5)
WBC: 9.8 10*3/uL (ref 4.0–10.5)
nRBC: 0 % (ref 0.0–0.2)

## 2021-10-06 LAB — COMPREHENSIVE METABOLIC PANEL
ALT: 18 U/L (ref 0–44)
AST: 26 U/L (ref 15–41)
Albumin: 3.5 g/dL (ref 3.5–5.0)
Alkaline Phosphatase: 85 U/L (ref 38–126)
Anion gap: 8 (ref 5–15)
BUN: 28 mg/dL — ABNORMAL HIGH (ref 6–20)
CO2: 21 mmol/L — ABNORMAL LOW (ref 22–32)
Calcium: 9 mg/dL (ref 8.9–10.3)
Chloride: 108 mmol/L (ref 98–111)
Creatinine, Ser: 1.12 mg/dL — ABNORMAL HIGH (ref 0.44–1.00)
GFR, Estimated: 59 mL/min — ABNORMAL LOW (ref >60)
Glucose, Bld: 127 mg/dL — ABNORMAL HIGH (ref 70–99)
Potassium: 3.5 mmol/L (ref 3.5–5.1)
Sodium: 137 mmol/L (ref 135–145)
Total Bilirubin: 0.7 mg/dL (ref 0.3–1.2)
Total Protein: 8.9 g/dL — ABNORMAL HIGH (ref 6.5–8.1)

## 2021-10-06 LAB — URINALYSIS, COMPLETE (UACMP) WITH MICROSCOPIC
Bacteria, UA: NONE SEEN
Bilirubin Urine: NEGATIVE
Glucose, UA: NEGATIVE mg/dL
Ketones, ur: NEGATIVE mg/dL
Nitrite: NEGATIVE
Protein, ur: NEGATIVE mg/dL
Specific Gravity, Urine: 1.027 (ref 1.005–1.030)
pH: 5 (ref 5.0–8.0)

## 2021-10-06 LAB — URINALYSIS, ROUTINE W REFLEX MICROSCOPIC
Bilirubin Urine: NEGATIVE
Glucose, UA: NEGATIVE mg/dL
Ketones, ur: NEGATIVE mg/dL
Nitrite: NEGATIVE
Protein, ur: 30 mg/dL — AB
Specific Gravity, Urine: 1.017 (ref 1.005–1.030)
WBC, UA: >50 WBC/hpf — ABNORMAL HIGH (ref 0–5)
pH: 5 (ref 5.0–8.0)

## 2021-10-06 LAB — LIPASE, BLOOD: Lipase: 34 U/L (ref 11–51)

## 2021-10-06 LAB — CBG MONITORING, ED: Glucose-Capillary: 95 mg/dL (ref 70–99)

## 2021-10-06 LAB — HEMOGLOBIN A1C
Hgb A1c MFr Bld: 5.9 % — ABNORMAL HIGH (ref 4.8–5.6)
Mean Plasma Glucose: 122.63 mg/dL

## 2021-10-06 LAB — GLUCOSE, CAPILLARY
Glucose-Capillary: 74 mg/dL (ref 70–99)
Glucose-Capillary: 152 mg/dL — ABNORMAL HIGH (ref 70–99)

## 2021-10-06 MED ORDER — ONDANSETRON HCL 4 MG/2ML IJ SOLN
4.0000 mg | Freq: Once | INTRAMUSCULAR | Status: AC
  Administered 2021-10-06: 4 mg via INTRAVENOUS
  Filled 2021-10-06: qty 2

## 2021-10-06 MED ORDER — SODIUM CHLORIDE 0.9 % IV SOLN
1.0000 g | Freq: Once | INTRAVENOUS | Status: AC
  Administered 2021-10-06: 1 g via INTRAVENOUS
  Filled 2021-10-06: qty 10

## 2021-10-06 MED ORDER — INSULIN ASPART 100 UNIT/ML IJ SOLN
0.0000 [IU] | Freq: Every day | INTRAMUSCULAR | Status: DC
  Administered 2021-10-07: 2 [IU] via SUBCUTANEOUS
  Filled 2021-10-06 (×2): qty 1

## 2021-10-06 MED ORDER — LACTATED RINGERS IV BOLUS
1000.0000 mL | Freq: Once | INTRAVENOUS | Status: AC
  Administered 2021-10-06: 1000 mL via INTRAVENOUS

## 2021-10-06 MED ORDER — ACETAMINOPHEN 325 MG PO TABS
650.0000 mg | ORAL_TABLET | Freq: Four times a day (QID) | ORAL | Status: DC | PRN
Start: 2021-10-06 — End: 2021-10-08
  Administered 2021-10-06: 650 mg via ORAL
  Filled 2021-10-06: qty 2

## 2021-10-06 MED ORDER — INSULIN ASPART 100 UNIT/ML IJ SOLN
0.0000 [IU] | Freq: Three times a day (TID) | INTRAMUSCULAR | Status: DC
  Administered 2021-10-08: 2 [IU] via SUBCUTANEOUS
  Filled 2021-10-06: qty 1

## 2021-10-06 MED ORDER — MORPHINE SULFATE (PF) 2 MG/ML IV SOLN
2.0000 mg | INTRAVENOUS | Status: DC | PRN
  Administered 2021-10-08: 2 mg via INTRAVENOUS
  Filled 2021-10-06: qty 1

## 2021-10-06 MED ORDER — FENTANYL CITRATE PF 50 MCG/ML IJ SOSY
50.0000 ug | PREFILLED_SYRINGE | Freq: Once | INTRAMUSCULAR | Status: AC
  Administered 2021-10-06: 50 ug via INTRAVENOUS
  Filled 2021-10-06: qty 1

## 2021-10-06 MED ORDER — SODIUM CHLORIDE 0.9% FLUSH
3.0000 mL | Freq: Two times a day (BID) | INTRAVENOUS | Status: DC
  Administered 2021-10-06 – 2021-10-07 (×3): 3 mL via INTRAVENOUS

## 2021-10-06 MED ORDER — SENNOSIDES-DOCUSATE SODIUM 8.6-50 MG PO TABS: 1.0000 | ORAL_TABLET | Freq: Every evening | ORAL | Status: DC | PRN

## 2021-10-06 MED ORDER — ACETAMINOPHEN 650 MG RE SUPP
650.0000 mg | Freq: Four times a day (QID) | RECTAL | Status: DC | PRN
Start: 2021-10-06 — End: 2021-10-08

## 2021-10-06 MED ORDER — SODIUM CHLORIDE 0.9 % IV SOLN
1.0000 g | INTRAVENOUS | Status: DC
  Administered 2021-10-07: 1 g via INTRAVENOUS
  Filled 2021-10-06: qty 10
  Filled 2021-10-06: qty 1

## 2021-10-06 MED ORDER — IOHEXOL 300 MG/ML  SOLN
100.0000 mL | Freq: Once | INTRAMUSCULAR | Status: AC | PRN
  Administered 2021-10-06: 100 mL via INTRAVENOUS

## 2021-10-06 NOTE — ED Triage Notes (Signed)
Pt via POV from home. Pt c/o lower abd pain, nausea. Denies any urinary symptoms. Pt has a hx of an hysterectomy. Pt is A&OX4 and NAD.  ?

## 2021-10-06 NOTE — ED Notes (Signed)
Patient's bed wet. Patient changed in hospital gown. New sheets placed on bed. Call light within reach. No expressed needs to RN. ?

## 2021-10-06 NOTE — Assessment & Plan Note (Signed)
-   no longer on imuran; she has seen rheum and says she's been off imuran for about 1 year ?

## 2021-10-06 NOTE — ED Provider Notes (Signed)
? ?[facility] ?Provider Note ? ? ? Event Date/Time  ? First MD Initiated Contact with Patient 10/06/21 [phone]   ?  (approximate) ? ? ?History  ? ?Abdominal Pain ? ? ?HPI ? ?Lindsay Dean is a 56 y.o. female   DM, HTN, and systemic lupus erythematosus who presents for evaluation of some left lower quadrant abdominal pain associate with nonbloody nonbilious vomiting and some urinary hesitancy starting yesterday.  No back pain, right abdominal pain, diarrhea, constipation, chest pain, cough, shortness of breath, headache, earache, sore throat, rash or any recent falls or injuries.  No recent EtOH use illicit drug use.  She tried some ibuprofen yesterday but did not help much.  She states it feels similar but a little worse than previous UTIs she is having past.  No other concerns at this time. ? ?  ? ? ?Physical Exam  ?Triage Vital Signs: ?ED Triage Vitals  ?Enc Vitals Group  ?   BP 10/06/21 0816 (!) 147/101  ?   Pulse Rate 10/06/21 0816 66  ?   Resp 10/06/21 0816 18  ?   Temp 10/06/21 0818 97.6 ?F (36.4 ?C)  ?   Temp src --   ?   SpO2 10/06/21 0816 100 %  ?   Weight 10/06/21 0813 150 lb (68 kg)  ?   Height 10/06/21 0813 4\' 9"  (1.448 m)  ?   Head Circumference --   ?   Peak Flow --   ?   Pain Score 10/06/21 0813 10  ?   Pain Loc --   ?   Pain Edu? --   ?   Excl. in GC? --   ? ? ?Most recent vital signs: ?Vitals:  ? 10/06/21 0900 10/06/21 0930  ?BP: 129/74 121/71  ?Pulse: 66 76  ?Resp: 15 18  ?Temp:    ?SpO2: 97% 98%  ? ? ?General: Awake, no distress.  ?CV:  Good peripheral perfusion.  2+ radial pulse. ?Resp:  Normal effort.  ?Abd:  No distention.  Mild tenderness in the left lower quadrant suprapubic region.  No CVA tenderness.  No rigidity or guarding ?Other:   ? ? ?ED Results / Procedures / Treatments  ?Labs ?(all labs ordered are listed, but only abnormal results are displayed) ?Labs Reviewed  ?COMPREHENSIVE METABOLIC PANEL - Abnormal; Notable for the following components:  ?    Result  Value  ? CO2 21 (*)   ? Glucose, Bld 127 (*)   ? BUN 28 (*)   ? Creatinine, Ser 1.12 (*)   ? Total Protein 8.9 (*)   ? GFR, Estimated 59 (*)   ? All other components within normal limits  ?CBC - Abnormal; Notable for the following components:  ? RBC 3.72 (*)   ? Hemoglobin 11.2 (*)   ? HCT 34.4 (*)   ? All other components within normal limits  ?URINALYSIS, ROUTINE W REFLEX MICROSCOPIC - Abnormal; Notable for the following components:  ? Color, Urine YELLOW (*)   ? APPearance HAZY (*)   ? Hgb urine dipstick MODERATE (*)   ? Protein, ur 30 (*)   ? Leukocytes,Ua SMALL (*)   ? WBC, UA >50 (*)   ? Bacteria, UA RARE (*)   ? All other components within normal limits  ?URINE CULTURE  ?CULTURE, BLOOD (ROUTINE X 2)  ?CULTURE, BLOOD (ROUTINE X 2)  ?LIPASE, BLOOD  ? ? ? ?EKG ? ?Sinus rhythm with a ventricular rate of 66, isolated nonspecific T  wave change in lead III without any other clear evidence of acute ischemia or significant arrhythmia. ? ? ?RADIOLOGY ? ?CT abdomen pelvis on my interpretation shows an perinephric stranding around the left kidney with what appears to be an obstructing left-sided ureteral stone.  I do not see evidence of diverticulitis or other clear acute process.  I reviewed radiology interpretation and agree to findings of a 6 mm obstructing stone within the distal left ureter with some proximal hydronephrosis and left-sided perinephric and periureteral inflammation as well as right-sided hydronephrosis possibly chronic without any stones noted on the right.  There is also notation of some diverticulosis without diverticulitis and some degenerative changes in the lumbar spine. ? ?PROCEDURES: ? ?Critical Care performed: No ? ?Procedures ? ? ? ?MEDICATIONS ORDERED IN ED: ?Medications  ?lactated ringers bolus 1,000 mL (1,000 mLs Intravenous New Bag/Given 10/06/21 0838)  ?ondansetron [facility]) injection 4 mg (4 mg Intravenous Given 10/06/21 0836)  ?fentaNYL (SUBLIMAZE) injection 50 mcg (50 mcg Intravenous  Given 10/06/21 0836)  ?iohexol (OMNIPAQUE) 300 MG/ML solution 100 mL (100 mLs Intravenous Contrast Given 10/06/21 0907)  ?cefTRIAXone (ROCEPHIN) 1 g in sodium chloride 0.9 % 100 mL IVPB (1 g Intravenous New Bag/Given 10/06/21 0935)  ? ? ? ?IMPRESSION / MDM / ASSESSMENT AND PLAN / ED COURSE  ?I reviewed the triage vital signs and the nursing notes. ?             ?               ? ?Differential diagnosis includes, but is not limited to diverticulitis, gastritis, pancreatitis, cystitis, kidney stone, SBO and metabolic derangements. ? ? ?CT abdomen pelvis on my interpretation shows an perinephric stranding around the left kidney with what appears to be an obstructing left-sided ureteral stone.  I do not see evidence of diverticulitis or other clear acute process.  I reviewed radiology interpretation and agree to findings of a 6 mm obstructing stone within the distal left ureter with some proximal hydronephrosis and left-sided perinephric and periureteral inflammation as well as right-sided hydronephrosis possibly chronic without any stones noted on the right.  There is also notation of some diverticulosis without diverticulitis and some degenerative changes in the lumbar spine. ? ?Lipase not consistent with acute pancreatitis.  CMP shows no significant electrolyte or metabolic dera CBC without leukocytosis and hemoglobin of 11.2.  [place].  Kidney function today is 1.12 compared to 092 from 04/11/2021.  UA unfortunate has subsequently still visual cells but is concerning for infection with greater than 50 WBCs, 21-50 RBCs, small leukocyte esterase and bacteria noted. ? ?Given evidence on CT imaging of some pyelonephritis with obstructing kidney stone and evidence of possible infection on UA I consulted with Dr. Lonna Cobb with urology who recommended hospitalist admission antibiotics and plan to evaluate patient later today.  I discussed this with admitting hospitalist and updated patient bedside.  Patient admitted in stable  condition.  I do not believe she is septic at this time although cultures were obtained. ?  ? ? ?FINAL CLINICAL IMPRESSION(S) / ED DIAGNOSES  ? ?Final diagnoses:  ?Acute cystitis without hematuria  ?Kidney stone  ?Pyelonephritis  ? ? ? ?Rx / DC Orders  ? ?ED Discharge Orders   ? ? None  ? ?  ? ? ? ?Note:  This document was prepared using Dragon voice recognition software and may include unintentional dictation errors. ?  ?Gilles Chiquito, MD ?10/06/21 1011 ? ?

## 2021-10-06 NOTE — H&P (View-Only) (Signed)
? ?Urology Consult ? ?Requesting physician: Hulan Saas, MD ? ?Reason for consultation: Obstructing ureteral calculus with possible infection ? ? ?History of Present Illness: Lindsay Dean is a 56 y.o. who presented to the ED this morning complaining of left lower quadrant abdominal pain  ? ?Initial pain was severe and associated with nausea/vomiting.  No identifiable precipitating, aggravating or alleviating factors ?Some urinary hesitancy and urgency ?CT abdomen pelvis with contrast showed a 6 mm left distal ureteral calculus just proximal to the UVJ with moderate left hydronephrosis/hydroureter.  Also with moderate right hydronephrosis which appears to be chronic ?Symptoms controlled with parenteral analgesia and she was asymptomatic at the time of our visit ?UA with 21-50 RBC.  There were >50 WBCs however 11-20 squamous epithelial cells ?She had no leukocytosis ?Admitted in November 2021 with right hydronephrosis and sepsis from a urinary source thought to be secondary to a punctate stone in the distal ureter.  Previous history of a right distal ureteral injury during a transvaginal hysterectomy and vaginal vault suspension with urogynecology at [facility] in December 2019 that required intraoperative urology consult and a right ureteral implant.  Stent placement was attempted during her hospitalization however the right UO cannot be identified and subsequently a percutaneous nephrostomy tube was placed. ? ? ?Past Medical History:  ?Diagnosis Date  ? Diabetes mellitus without complication ([place])   ? taken off meds in 2017  ? Hypertension   ? Lupus ([place])   ? ? ?Past Surgical History:  ?Procedure Laterality Date  ? ABDOMINAL HYSTERECTOMY    ? CYSTOSCOPY WITH STENT PLACEMENT Right 05/04/2019  ? Procedure: CYSTOSCOPY with attempt of stent placement;  Surgeon: Billey Co, MD;  Location: [facility] ORS;  Service: Urology;  Laterality: Right;  ? ? ?Home Medications:  ?Current Meds  ?Medication Sig  ? atenolol  (TENORMIN) 50 MG tablet Take 50 mg by mouth daily.  ? atorvastatin (LIPITOR) 20 MG tablet Take 20 mg by mouth daily.  ? hydrochlorothiazide (HYDRODIURIL) 25 MG tablet Take 25 mg by mouth daily.  ? ibuprofen (ADVIL) 800 MG tablet Take 800 mg by mouth daily.  ? losartan (COZAAR) 100 MG tablet Take 100 mg by mouth daily.  ? omeprazole (PRILOSEC) 20 MG capsule Take 20 mg by mouth daily.  ? ? ?Allergies:  ?Allergies  ?Allergen Reactions  ? Tramadol Nausea Only  ?  Other reaction(s): Dizziness  ? ? ?Family History  ?Problem Relation Age of Onset  ? Diabetes Mother   ? Heart disease Mother   ? Hypertension Mother   ? Arthritis Mother   ? Hypertension Father   ? Lung disease Father   ? ? ?Social History:  reports that she has never smoked. She has never used smokeless tobacco. She reports that she does not drink alcohol and does not use drugs. ? ?ROS: ?A complete review of systems was performed.  All systems are negative except for pertinent findings as noted. ? ?Physical Exam:  ?Vital signs in last 24 hours: ?Temp:  [97.6 ?F (36.4 ?C)-98.8 ?F (37.1 ?C)] 98.8 ?F (37.1 ?C) (04/17 1925) ?Pulse Rate:  [64-82] 73 (04/17 1925) ?Resp:  [15-22] 20 (04/17 1925) ?BP: (121-152)/(71-101) 130/94 (04/17 1925) ?SpO2:  [97 %-100 %] 99 % (04/17 1925) ?Weight:  [68 kg] 68 kg (04/17 0813) ?Constitutional:  Alert, No acute distress ?HEENT: [address] AT, moist mucus membranes.  Trachea midline, no masses ?Cardiovascular: Regular rate and rhythm, no clubbing, cyanosis, or edema. ?Respiratory: Normal respiratory effort, lungs clear bilaterally ?GI: Abdomen is soft,  nontender, nondistended, no abdominal masses ?GU: No CVA tenderness ?Skin: No rashes, bruises or suspicious lesions ?Lymph: No cervical or inguinal adenopathy ?Neurologic: Grossly intact, no focal deficits, moving all 4 extremities ?Psychiatric: Normal mood and affect ? ? ?Laboratory Data:  ?Recent Labs  ?  10/06/21 ?0814  ?WBC 9.8  ?HGB 11.2*  ?HCT 34.4*  ? ?Recent Labs  ?  10/06/21 ?0814   ?NA 137  ?K 3.5  ?CL 108  ?CO2 21*  ?GLUCOSE 127*  ?BUN 28*  ?CREATININE 1.12*  ?CALCIUM 9.0  ? ? ?Radiologic Imaging: ?CT images were personally reviewed and interpreted ? ?CT ABDOMEN PELVIS W CONTRAST ? ?Result Date: 10/06/2021 ?CLINICAL DATA:  LEFT lower quadrant abdominal pain.  Nausea. EXAM: CT ABDOMEN AND PELVIS WITH CONTRAST TECHNIQUE: Multidetector CT imaging of the abdomen and pelvis was performed using the standard protocol following bolus administration of intravenous contrast. RADIATION DOSE REDUCTION: This exam was performed according to the departmental dose-optimization program which includes automated exposure control, adjustment of the mA and/or kV according to patient size and/or use of iterative reconstruction technique. CONTRAST:  100mL OMNIPAQUE IOHEXOL 300 MG/ML  SOLN COMPARISON:  CT abdomen dated 07/04/2019 FINDINGS: Lower chest: No acute/significant abnormality. Hepatobiliary: Status post cholecystectomy. No focal liver abnormality is seen. No bile duct dilatation. Pancreas: Unremarkable. No pancreatic ductal dilatation or surrounding inflammatory changes. Spleen: Normal in size without focal abnormality. Adrenals/Urinary Tract: Acute appearing LEFT-sided hydronephrosis, at least moderate in degree, with associated perinephric and periureteral inflammation. 6 mm obstructing stone within the distal LEFT ureter, just proximal to the LEFT UVJ. Additional RIGHT-sided hydronephrosis, also moderate in degree, presumably chronic. No RIGHT-sided ureteral stone. Bladder is decompressed. Stomach/Bowel: No dilated large or small bowel loops. No evidence of focal bowel wall inflammation. Stomach is unremarkable, partially decompressed. Appendix is normal. Scattered diverticulosis throughout the colon but no focal inflammatory change to suggest acute diverticulitis. Vascular/Lymphatic: No acute or significant vascular findings. No enlarged lymph nodes are seen within the abdomen or pelvis. Reproductive:  Presumed hysterectomy.  No adnexal mass or free fluid. Other: No free fluid or abscess collection is seen. No free intraperitoneal air. Musculoskeletal: Degenerative spondylosis at the L4-5 and L5-S1 levels, at least moderate in degree with associated disc space narrowing, vacuum disc phenomenon and osseous spurring. 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 No acute-appearing osseous abnormality. IMPRESSION: 1. 6 mm obstructing stone within the distal LEFT ureter, located just proximal to the LEFT UVJ, resulting in moderate LEFT-sided hydronephrosis and associated perinephric and periureteral inflammation. 2. Additional RIGHT-sided hydronephrosis, also moderate in degree, presumably chronic. No RIGHT-sided ureteral stone. 3. Colonic diverticulosis without evidence of acute diverticulitis. No bowel obstruction. 4. 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 5. Degenerative spondylosis at the L4-5 and L5-S1 levels, at least moderate in degree, as detailed above. Electronically Signed   By: Stan  Maynard M.D.   On: 10/06/2021 09:28   ? ?Impression/Assessment:  ? ?1.  Left distal ureteral calculus ?6 mm obstructing left distal ureteral calculus ?She is not toxic appearing and has no leukocytosis ?Urinalysis with pyuria but significant vaginal contamination ? ?Recommendation:  ?Management options were discussed including medical expulsion therapy/trial of passage, ureteroscopic removal and shockwave lithotripsy. ?Based on stone size less likely passed and with her previous history of an obstructing stone and sepsis would recommend ureteroscopic removal ?Repeat urinalysis ?If there is evidence of infected urine proximal to the stone no attempt at removal will be made and a   ureteral stent was placed and she will require definitive stone treatment after infection has been treated ?The procedure was cussed in detail including potential risks  of bleeding, infection/sepsis and ureteral injury.  The need for a postoperative stent and stent pain/symptoms was reviewed. ?All questions were answered and she desires to proceed. ? ?A Spanish interpreter was

## 2021-10-06 NOTE — H&P (Addendum)
?History and Physical  ? ? ?Lindsay Dean  ?MBW:[id number]  ?DOB: 1966-03-10  ?DOA: 10/06/2021 ? ?PCP: Preston Fleeting, MD ?Patient coming from: home ? ?Chief Complaint: abdominal pain ? ?HPI:  ?Ms. Lindsay Dean is a 56 yo female with PMH DMII, HTN, Lupus (off imuran about 1 year per rheumatology) who presented with lower abdominal pain and nausea. She endorsed associated nausea but no vomiting. She has a history of UTI approximately 3 months ago but denies prior history of obstructed kidney stones.  ?CT A/P showed a 59mm obstructing left ureteral stone proximal to the UVJ and resulting in moderate hydronephrosis.  ?She denied any fevers at home. UA showed small LE, neg nitrite, >50 WBC, rare bacteria. She was started on Rocephin and urology was consulted as well. ? ?I have personally briefly reviewed patient's old medical records in [facility] and discussed patient with the ER provider when appropriate/indicated. ? ?Assessment and Plan: ?* Ureterolithiasis ?- CT shows 6 mm obstructing Left ureteral stone with moderate hydronephrosis; also shows R moderate hydronephrosis but no obstructing stone seen ?- UA concerning for possible infection in setting of obstruction; continue Rocephin and follow up cultures ?- urology consulted for possible stent placement and further evaluation ? ?Diabetes mellitus without complication (HCC) ?- check A1c ?- continue on SSI and CBG monitoring for now ? ?Lupus (HCC) ?- no longer on imuran; she has seen rheum and says she's been off imuran for about 1 year ? ?  ? ?Code Status: Full    ?DVT Prophylaxis: SCD   ?  ?Anticipated disposition is to: Home ? ?History: ?Past Medical History:  ?Diagnosis Date  ? Diabetes mellitus without complication (HCC)   ? taken off meds in 2017  ? Hypertension   ? Lupus (HCC)   ? ? ?Past Surgical History:  ?Procedure Laterality Date  ? ABDOMINAL HYSTERECTOMY    ? CYSTOSCOPY WITH STENT PLACEMENT Right 05/04/2019  ? Procedure: CYSTOSCOPY with  attempt of stent placement;  Surgeon: Sondra Come, MD;  Location: [facility] ORS;  Service: Urology;  Laterality: Right;  ? ? ? reports that she has never smoked. She has never used smokeless tobacco. She reports that she does not drink alcohol and does not use drugs. ? ?Allergies  ?Allergen Reactions  ? Tramadol Nausea Only  ?  Other reaction(s): Dizziness  ? ? ?Family History  ?Problem Relation Age of Onset  ? Diabetes Mother   ? Heart disease Mother   ? Hypertension Mother   ? Arthritis Mother   ? Hypertension Father   ? Lung disease Father   ? ? ?Home Medications: ?Prior to Admission medications   ?Medication Sig Start Date End Date Taking? Authorizing Provider  ?acetaminophen (TYLENOL) 500 MG tablet Take 1,000 mg by mouth every 8 (eight) hours as needed.    [provider]  ?atenolol (TENORMIN) 50 MG tablet Take 50 mg by mouth daily.    [provider]  ?atorvastatin (LIPITOR) 20 MG tablet Take 20 mg by mouth daily.    [provider]  ?hydrochlorothiazide (HYDRODIURIL) 25 MG tablet Take 25 mg by mouth daily. 08/27/21   [provider]  ?ibuprofen (ADVIL) 800 MG tablet Take by mouth daily. 07/25/21   [provider]  ?losartan (COZAAR) 100 MG tablet Take 100 mg by mouth daily. 07/25/21   [provider]  ?omeprazole (PRILOSEC) 20 MG capsule Take 20 mg by mouth daily. 07/25/21   [provider]  ? ? ?Review of Systems:  ?Review  of Systems  ?Constitutional:  Negative for chills, diaphoresis and fever.  ?HENT: Negative.    ?Eyes: Negative.   ?Respiratory: Negative.    ?Cardiovascular: Negative.   ?Gastrointestinal:  Positive for abdominal pain. Negative for constipation, diarrhea, nausea and vomiting.  ?Genitourinary:  Positive for frequency. Negative for hematuria.  ?Musculoskeletal: Negative.   ?Skin: Negative.   ?Neurological: Negative.   ?Endo/Heme/Allergies: Negative.   ?Psychiatric/Behavioral: Negative.    ? ?Physical Exam:  ?Vitals:  ? 10/06/21 0818  10/06/21 0900 10/06/21 0930 10/06/21 1100  ?BP:  129/74 121/71 (!) 146/77  ?Pulse:  66 76 64  ?Resp:  15 18 18   ?Temp: 97.6 ?F (36.4 ?C)     ?SpO2:  97% 98% 100%  ?Weight:      ?Height:      ? ?Physical Exam ?Constitutional:   ?   General: She is not in acute distress. ?HENT:  ?   Head: Normocephalic and atraumatic.  ?   Mouth/Throat:  ?   Mouth: Mucous membranes are moist.  ?Eyes:  ?   Extraocular Movements: Extraocular movements intact.  ?Cardiovascular:  ?   Rate and Rhythm: Normal rate and regular rhythm.  ?Pulmonary:  ?   Effort: Pulmonary effort is normal.  ?   Breath sounds: Normal breath sounds.  ?Abdominal:  ?   General: Bowel sounds are normal. There is no distension.  ?   Palpations: Abdomen is soft.  ?Musculoskeletal:     ?   General: Normal range of motion.  ?   Cervical back: Normal range of motion and neck supple.  ?Skin: ?   General: Skin is warm and dry.  ?Neurological:  ?   General: No focal deficit present.  ?   Mental Status: She is alert.  ?Psychiatric:     ?   Mood and Affect: Mood normal.  ?  ? ?Labs on Admission:  ?I have personally reviewed following labs and imaging studies ?Results for orders placed or performed during the hospital encounter of 10/06/21 (from the past 24 hour(s))  ?Lipase, blood     Status: None  ? Collection Time: 10/06/21  8:14 AM  ?Result Value Ref Range  ? Lipase 34 11 - 51 U/L  ?Comprehensive metabolic panel     Status: Abnormal  ? Collection Time: 10/06/21  8:14 AM  ?Result Value Ref Range  ? Sodium 137 135 - 145 mmol/L  ? Potassium 3.5 3.5 - 5.1 mmol/L  ? Chloride 108 98 - 111 mmol/L  ? CO2 21 (L) 22 - 32 mmol/L  ? Glucose, Bld 127 (H) 70 - 99 mg/dL  ? BUN 28 (H) 6 - 20 mg/dL  ? Creatinine, Ser 1.12 (H) 0.44 - 1.00 mg/dL  ? Calcium 9.0 8.9 - 10.3 mg/dL  ? Total Protein 8.9 (H) 6.5 - 8.1 g/dL  ? Albumin 3.5 3.5 - 5.0 g/dL  ? AST 26 15 - 41 U/L  ? ALT 18 0 - 44 U/L  ? Alkaline Phosphatase 85 38 - 126 U/L  ? Total Bilirubin 0.7 0.3 - 1.2 mg/dL  ? GFR, Estimated 59 (L)  >60 mL/min  ? Anion gap 8 5 - 15  ?CBC     Status: Abnormal  ? Collection Time: 10/06/21  8:14 AM  ?Result Value Ref Range  ? WBC 9.8 4.0 - 10.5 K/uL  ? RBC 3.72 (L) 3.87 - 5.11 MIL/uL  ? Hemoglobin 11.2 (L) 12.0 - 15.0 g/dL  ? HCT 34.4 (L) 36.0 - 46.0 %  ?  MCV 92.5 80.0 - 100.0 fL  ? MCH 30.1 26.0 - 34.0 pg  ? MCHC 32.6 30.0 - 36.0 g/dL  ? RDW 13.2 11.5 - 15.5 %  ? Platelets 223 150 - 400 K/uL  ? nRBC 0.0 0.0 - 0.2 %  ?Urinalysis, Routine w reflex microscopic     Status: Abnormal  ? Collection Time: 10/06/21  8:38 AM  ?Result Value Ref Range  ? Color, Urine YELLOW (A) YELLOW  ? APPearance HAZY (A) CLEAR  ? Specific Gravity, Urine 1.017 1.005 - 1.030  ? pH 5.0 5.0 - 8.0  ? Glucose, UA NEGATIVE NEGATIVE mg/dL  ? Hgb urine dipstick MODERATE (A) NEGATIVE  ? Bilirubin Urine NEGATIVE NEGATIVE  ? Ketones, ur NEGATIVE NEGATIVE mg/dL  ? Protein, ur 30 (A) NEGATIVE mg/dL  ? Nitrite NEGATIVE NEGATIVE  ? Leukocytes,Ua SMALL (A) NEGATIVE  ? RBC / HPF 21-50 0 - 5 RBC/hpf  ? WBC, UA >50 (H) 0 - 5 WBC/hpf  ? Bacteria, UA RARE (A) NONE SEEN  ? Squamous Epithelial / LPF 11-20 0 - 5  ? Mucus PRESENT   ? Hyaline Casts, UA PRESENT   ?CBG monitoring, ED     Status: None  ? Collection Time: 10/06/21 11:20 AM  ?Result Value Ref Range  ? Glucose-Capillary 95 70 - 99 mg/dL  ?  ? ?Radiological Exams on Admission: ?CT ABDOMEN PELVIS W CONTRAST ? ?Result Date: 10/06/2021 ?CLINICAL DATA:  LEFT lower quadrant abdominal pain.  Nausea. EXAM: CT ABDOMEN AND PELVIS WITH CONTRAST TECHNIQUE: Multidetector CT imaging of the abdomen and pelvis was performed using the standard protocol following bolus administration of intravenous contrast. RADIATION DOSE REDUCTION: This exam was performed according to the departmental dose-optimization program which includes automated exposure control, adjustment of the mA and/or kV according to patient size and/or use of iterative reconstruction technique. CONTRAST:  OMNIPAQUE IOHEXOL 300 MG/ML  SOLN COMPARISON:   CT abdomen dated 07/04/2019 FINDINGS: Lower chest: No acute/significant abnormality. Hepatobiliary: Status post cholecystectomy. No focal liver abnormality is seen. No bile duct dilatation. Pancreas: Devin Going

## 2021-10-06 NOTE — Hospital Course (Addendum)
Ms. Lindsay Dean is a 56 yo female with PMH DMII, HTN, Lupus (off imuran about 1 year per rheumatology) who presented with lower abdominal pain and nausea. She endorsed associated nausea but no vomiting. She has a history of UTI approximately 3 months ago but denies prior history of obstructed kidney stones.  ?CT A/P showed a 53mm obstructing left ureteral stone proximal to the UVJ and resulting in moderate hydronephrosis.  ?She denied any fevers at home. UA showed small LE, neg nitrite, >50 WBC, rare bacteria. She was started on Rocephin and urology was consulted as well. ?

## 2021-10-06 NOTE — Assessment & Plan Note (Signed)
-   CT shows 6 mm obstructing Left ureteral stone with moderate hydronephrosis; also shows R moderate hydronephrosis but no obstructing stone seen ?- UA concerning for possible infection in setting of obstruction; continue Rocephin and follow up cultures ?- urology consulted for possible stent placement and further evaluation ?

## 2021-10-06 NOTE — Consult Note (Signed)
? ?Urology Consult ? ?Requesting physician: Hulan Saas, MD ? ?Reason for consultation: Obstructing ureteral calculus with possible infection ? ? ?History of Present Illness: Lindsay Dean is a 56 y.o. who presented to the ED this morning complaining of left lower quadrant abdominal pain  ? ?Initial pain was severe and associated with nausea/vomiting.  No identifiable precipitating, aggravating or alleviating factors ?Some urinary hesitancy and urgency ?CT abdomen pelvis with contrast showed a 6 mm left distal ureteral calculus just proximal to the UVJ with moderate left hydronephrosis/hydroureter.  Also with moderate right hydronephrosis which appears to be chronic ?Symptoms controlled with parenteral analgesia and she was asymptomatic at the time of our visit ?UA with 21-50 RBC.  There were >50 WBCs however 11-20 squamous epithelial cells ?She had no leukocytosis ?Admitted in November 2021 with right hydronephrosis and sepsis from a urinary source thought to be secondary to a punctate stone in the distal ureter.  Previous history of a right distal ureteral injury during a transvaginal hysterectomy and vaginal vault suspension with urogynecology at [facility] in December 2019 that required intraoperative urology consult and a right ureteral implant.  Stent placement was attempted during her hospitalization however the right UO cannot be identified and subsequently a percutaneous nephrostomy tube was placed. ? ? ?Past Medical History:  ?Diagnosis Date  ? Diabetes mellitus without complication ([place])   ? taken off meds in 2017  ? Hypertension   ? Lupus ([place])   ? ? ?Past Surgical History:  ?Procedure Laterality Date  ? ABDOMINAL HYSTERECTOMY    ? CYSTOSCOPY WITH STENT PLACEMENT Right 05/04/2019  ? Procedure: CYSTOSCOPY with attempt of stent placement;  Surgeon: Billey Co, MD;  Location: [facility] ORS;  Service: Urology;  Laterality: Right;  ? ? ?Home Medications:  ?Current Meds  ?Medication Sig  ? atenolol  (TENORMIN) 50 MG tablet Take 50 mg by mouth daily.  ? atorvastatin (LIPITOR) 20 MG tablet Take 20 mg by mouth daily.  ? hydrochlorothiazide (HYDRODIURIL) 25 MG tablet Take 25 mg by mouth daily.  ? ibuprofen (ADVIL) 800 MG tablet Take 800 mg by mouth daily.  ? losartan (COZAAR) 100 MG tablet Take 100 mg by mouth daily.  ? omeprazole (PRILOSEC) 20 MG capsule Take 20 mg by mouth daily.  ? ? ?Allergies:  ?Allergies  ?Allergen Reactions  ? Tramadol Nausea Only  ?  Other reaction(s): Dizziness  ? ? ?Family History  ?Problem Relation Age of Onset  ? Diabetes Mother   ? Heart disease Mother   ? Hypertension Mother   ? Arthritis Mother   ? Hypertension Father   ? Lung disease Father   ? ? ?Social History:  reports that she has never smoked. She has never used smokeless tobacco. She reports that she does not drink alcohol and does not use drugs. ? ?ROS: ?A complete review of systems was performed.  All systems are negative except for pertinent findings as noted. ? ?Physical Exam:  ?Vital signs in last 24 hours: ?Temp:  [97.6 ?F (36.4 ?C)-98.8 ?F (37.1 ?C)] 98.8 ?F (37.1 ?C) (04/17 1925) ?Pulse Rate:  [64-82] 73 (04/17 1925) ?Resp:  [15-22] 20 (04/17 1925) ?BP: (121-152)/(71-101) 130/94 (04/17 1925) ?SpO2:  [97 %-100 %] 99 % (04/17 1925) ?Weight:  [68 kg] 68 kg (04/17 0813) ?Constitutional:  Alert, No acute distress ?HEENT: [place] AT, moist mucus membranes.  Trachea midline, no masses ?Cardiovascular: Regular rate and rhythm, no clubbing, cyanosis, or edema. ?Respiratory: Normal respiratory effort, lungs clear bilaterally ?GI: Abdomen is soft,  nontender, nondistended, no abdominal masses ?GU: No CVA tenderness ?Skin: No rashes, bruises or suspicious lesions ?Lymph: No cervical or inguinal adenopathy ?Neurologic: Grossly intact, no focal deficits, moving all 4 extremities ?Psychiatric: Normal mood and affect ? ? ?Laboratory Data:  ?Recent Labs  ?  10/06/21 ?[id number]  ?WBC 9.8  ?HGB 11.2*  ?HCT 34.4*  ? ?Recent Labs  ?  10/06/21 ?0814   ?NA 137  ?K 3.5  ?CL 108  ?CO2 21*  ?GLUCOSE 127*  ?BUN 28*  ?CREATININE 1.12*  ?CALCIUM 9.0  ? ? ?Radiologic Imaging: ?CT images were personally reviewed and interpreted ? ?CT ABDOMEN PELVIS W CONTRAST ? ?Result Date: 10/06/2021 ?CLINICAL DATA:  LEFT lower quadrant abdominal pain.  Nausea. EXAM: CT ABDOMEN AND PELVIS WITH CONTRAST TECHNIQUE: Multidetector CT imaging of the abdomen and pelvis was performed using the standard protocol following bolus administration of intravenous contrast. RADIATION DOSE REDUCTION: This exam was performed according to the departmental dose-optimization program which includes automated exposure control, adjustment of the mA and/or kV according to patient size and/or use of iterative reconstruction technique. CONTRAST:  12mL OMNIPAQUE IOHEXOL 300 MG/ML  SOLN COMPARISON:  CT abdomen dated 07/04/2019 FINDINGS: Lower chest: No acute/significant abnormality. Hepatobiliary: Status post cholecystectomy. No focal liver abnormality is seen. No bile duct dilatation. Pancreas: Unremarkable. No pancreatic ductal dilatation or surrounding inflammatory changes. Spleen: Normal in size without focal abnormality. Adrenals/Urinary Tract: Acute appearing LEFT-sided hydronephrosis, at least moderate in degree, with associated perinephric and periureteral inflammation. 6 mm obstructing stone within the distal LEFT ureter, just proximal to the LEFT UVJ. Additional RIGHT-sided hydronephrosis, also moderate in degree, presumably chronic. No RIGHT-sided ureteral stone. Bladder is decompressed. Stomach/Bowel: No dilated large or small bowel loops. No evidence of focal bowel wall inflammation. Stomach is unremarkable, partially decompressed. Appendix is normal. Scattered diverticulosis throughout the colon but no focal inflammatory change to suggest acute diverticulitis. Vascular/Lymphatic: No acute or significant vascular findings. No enlarged lymph nodes are seen within the abdomen or pelvis. Reproductive:  Presumed hysterectomy.  No adnexal mass or free fluid. Other: No free fluid or abscess collection is seen. No free intraperitoneal air. Musculoskeletal: Degenerative spondylosis at the L4-5 and L5-S1 levels, at least moderate in degree with associated disc space narrowing, vacuum disc phenomenon and osseous spurring. 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 No acute-appearing osseous abnormality. IMPRESSION: 1. 6 mm obstructing stone within the distal LEFT ureter, located just proximal to the LEFT UVJ, resulting in moderate LEFT-sided hydronephrosis and associated perinephric and periureteral inflammation. 2. Additional RIGHT-sided hydronephrosis, also moderate in degree, presumably chronic. No RIGHT-sided ureteral stone. 3. Colonic diverticulosis without evidence of acute diverticulitis. No bowel obstruction. 4. 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 5. Degenerative spondylosis at the L4-5 and L5-S1 levels, at least moderate in degree, as detailed above. Electronically Signed   By: Franki Cabot M.D.   On: 10/06/2021 09:28   ? ?Impression/Assessment:  ? ?1.  Left distal ureteral calculus ?6 mm obstructing left distal ureteral calculus ?She is not toxic appearing and has no leukocytosis ?Urinalysis with pyuria but significant vaginal contamination ? ?Recommendation:  ?Management options were discussed including medical expulsion therapy/trial of passage, ureteroscopic removal and shockwave lithotripsy. ?Based on stone size less likely passed and with her previous history of an obstructing stone and sepsis would recommend ureteroscopic removal ?Repeat urinalysis ?If there is evidence of infected urine proximal to the stone no attempt at removal will be made and a  ureteral stent was placed and she will require definitive stone treatment after infection has been treated ?The procedure was cussed in detail including potential risks  of bleeding, infection/sepsis and ureteral injury.  The need for a postoperative stent and stent pain/symptoms was reviewed. ?All questions were answered and she desires to proceed. ? ?A Spanish interpreter was

## 2021-10-06 NOTE — Assessment & Plan Note (Signed)
-   check A1c ?- continue on SSI and CBG monitoring for now ?

## 2021-10-07 ENCOUNTER — Encounter: Payer: Self-pay | Admitting: Internal Medicine

## 2021-10-07 ENCOUNTER — Inpatient Hospital Stay: Payer: Medicaid Other | Admitting: Anesthesiology

## 2021-10-07 ENCOUNTER — Inpatient Hospital Stay: Payer: Medicaid Other

## 2021-10-07 ENCOUNTER — Encounter
Admission: EM | Disposition: A | Discharge status: Home / Self Care | Payer: Self-pay | Source: Home / Self Care | Attending: Internal Medicine

## 2021-10-07 DIAGNOSIS — E119 Type 2 diabetes mellitus without complications: Secondary | ICD-10-CM

## 2021-10-07 DIAGNOSIS — M329 Systemic lupus erythematosus, unspecified: Secondary | ICD-10-CM

## 2021-10-07 HISTORY — PX: CYSTOSCOPY W/ RETROGRADES: SHX1426

## 2021-10-07 HISTORY — PX: CYSTOSCOPY/URETEROSCOPY/HOLMIUM LASER/STENT PLACEMENT: SHX6546

## 2021-10-07 LAB — CBC WITH DIFFERENTIAL/PLATELET
Abs Immature Granulocytes: 0.02 10*3/uL (ref 0.00–0.07)
Basophils Absolute: 0 10*3/uL (ref 0.0–0.1)
Basophils Relative: 0 %
Eosinophils Absolute: 0.1 10*3/uL (ref 0.0–0.5)
Eosinophils Relative: 2 %
HCT: 33.7 % — ABNORMAL LOW (ref 36.0–46.0)
Hemoglobin: 10.9 g/dL — ABNORMAL LOW (ref 12.0–15.0)
Immature Granulocytes: 0 %
Lymphocytes Relative: 37 %
Lymphs Abs: 1.7 10*3/uL (ref 0.7–4.0)
MCH: 29.9 pg (ref 26.0–34.0)
MCHC: 32.3 g/dL (ref 30.0–36.0)
MCV: 92.3 fL (ref 80.0–100.0)
Monocytes Absolute: 0.5 10*3/uL (ref 0.1–1.0)
Monocytes Relative: 10 %
Neutro Abs: 2.4 10*3/uL (ref 1.7–7.7)
Neutrophils Relative %: 51 %
Platelets: 198 10*3/uL (ref 150–400)
RBC: 3.65 MIL/uL — ABNORMAL LOW (ref 3.87–5.11)
RDW: 13.2 % (ref 11.5–15.5)
WBC: 4.6 10*3/uL (ref 4.0–10.5)
nRBC: 0 % (ref 0.0–0.2)

## 2021-10-07 LAB — URINE CULTURE: Culture: <10000 — AB

## 2021-10-07 LAB — GLUCOSE, CAPILLARY
Glucose-Capillary: 95 mg/dL (ref 70–99)
Glucose-Capillary: 73 mg/dL (ref 70–99)
Glucose-Capillary: 69 mg/dL — ABNORMAL LOW (ref 70–99)
Glucose-Capillary: 89 mg/dL (ref 70–99)
Glucose-Capillary: 124 mg/dL — ABNORMAL HIGH (ref 70–99)
Glucose-Capillary: 250 mg/dL — ABNORMAL HIGH (ref 70–99)

## 2021-10-07 LAB — BASIC METABOLIC PANEL
Anion gap: 6 (ref 5–15)
BUN: 27 mg/dL — ABNORMAL HIGH (ref 6–20)
CO2: 24 mmol/L (ref 22–32)
Calcium: 9 mg/dL (ref 8.9–10.3)
Chloride: 108 mmol/L (ref 98–111)
Creatinine, Ser: 1.09 mg/dL — ABNORMAL HIGH (ref 0.44–1.00)
GFR, Estimated: >60 mL/min (ref >60)
Glucose, Bld: 101 mg/dL — ABNORMAL HIGH (ref 70–99)
Potassium: 3.2 mmol/L — ABNORMAL LOW (ref 3.5–5.1)
Sodium: 138 mmol/L (ref 135–145)

## 2021-10-07 LAB — HIV ANTIBODY (ROUTINE TESTING W REFLEX): HIV Screen 4th Generation wRfx: NONREACTIVE

## 2021-10-07 LAB — MAGNESIUM: Magnesium: 1.9 mg/dL (ref 1.7–2.4)

## 2021-10-07 SURGERY — CYSTOSCOPY/URETEROSCOPY/HOLMIUM LASER/STENT PLACEMENT
Anesthesia: General | Laterality: Left

## 2021-10-07 MED ORDER — ACETAMINOPHEN 500 MG PO TABS
ORAL_TABLET | ORAL | Status: AC
  Filled 2021-10-07: qty 2

## 2021-10-07 MED ORDER — ONDANSETRON HCL 4 MG/2ML IJ SOLN
INTRAMUSCULAR | Status: DC | PRN
  Administered 2021-10-07: 4 mg via INTRAVENOUS

## 2021-10-07 MED ORDER — LIDOCAINE HCL (PF) 2 % IJ SOLN
INTRAMUSCULAR | Status: AC
  Filled 2021-10-07: qty 5

## 2021-10-07 MED ORDER — EPHEDRINE SULFATE (PRESSORS) 50 MG/ML IJ SOLN
INTRAMUSCULAR | Status: DC | PRN
  Administered 2021-10-07: 5 mg via INTRAVENOUS
  Administered 2021-10-07: 10 mg via INTRAVENOUS

## 2021-10-07 MED ORDER — LIDOCAINE HCL (CARDIAC) PF 100 MG/5ML IV SOSY
PREFILLED_SYRINGE | INTRAVENOUS | Status: DC | PRN
  Administered 2021-10-07: 60 mg via INTRAVENOUS

## 2021-10-07 MED ORDER — SODIUM CHLORIDE 0.9 % IV SOLN: Freq: Once | INTRAVENOUS | Status: AC

## 2021-10-07 MED ORDER — EPHEDRINE 5 MG/ML INJ
INTRAVENOUS | Status: AC
  Filled 2021-10-07: qty 5

## 2021-10-07 MED ORDER — ACETAMINOPHEN 500 MG PO TABS
1000.0000 mg | ORAL_TABLET | Freq: Once | ORAL | Status: AC
  Administered 2021-10-07: 1000 mg via ORAL

## 2021-10-07 MED ORDER — DEXAMETHASONE SODIUM PHOSPHATE 10 MG/ML IJ SOLN
INTRAMUSCULAR | Status: AC
  Filled 2021-10-07: qty 1

## 2021-10-07 MED ORDER — MIDAZOLAM HCL 2 MG/2ML IJ SOLN
INTRAMUSCULAR | Status: AC
  Filled 2021-10-07: qty 2

## 2021-10-07 MED ORDER — MIDAZOLAM HCL 2 MG/2ML IJ SOLN
INTRAMUSCULAR | Status: DC | PRN
  Administered 2021-10-07: 2 mg via INTRAVENOUS

## 2021-10-07 MED ORDER — PHENYLEPHRINE HCL (PRESSORS) 10 MG/ML IV SOLN
INTRAVENOUS | Status: AC
  Filled 2021-10-07: qty 1

## 2021-10-07 MED ORDER — ACETAMINOPHEN 10 MG/ML IV SOLN: 1000.0000 mg | Freq: Once | INTRAVENOUS | Status: DC | PRN

## 2021-10-07 MED ORDER — FENTANYL CITRATE (PF) 100 MCG/2ML IJ SOLN
INTRAMUSCULAR | Status: DC | PRN
  Administered 2021-10-07 (×2): 50 ug via INTRAVENOUS

## 2021-10-07 MED ORDER — PHENYLEPHRINE 80 MCG/ML (10ML) SYRINGE FOR IV PUSH (FOR BLOOD PRESSURE SUPPORT)
PREFILLED_SYRINGE | INTRAVENOUS | Status: DC | PRN
  Administered 2021-10-07 (×3): 100 ug via INTRAVENOUS

## 2021-10-07 MED ORDER — IOHEXOL 180 MG/ML  SOLN
INTRAMUSCULAR | Status: DC | PRN
Start: 2021-10-07 — End: 2021-10-07
  Administered 2021-10-07: 10 mL

## 2021-10-07 MED ORDER — SODIUM CHLORIDE 0.9 % IR SOLN
Status: DC | PRN
  Administered 2021-10-07: 3000 mL via INTRAVESICAL

## 2021-10-07 MED ORDER — FENTANYL CITRATE (PF) 100 MCG/2ML IJ SOLN: 25.0000 ug | INTRAMUSCULAR | Status: DC | PRN

## 2021-10-07 MED ORDER — PROMETHAZINE HCL 25 MG/ML IJ SOLN: 6.2500 mg | INTRAMUSCULAR | Status: DC | PRN

## 2021-10-07 MED ORDER — PROPOFOL 10 MG/ML IV BOLUS
INTRAVENOUS | Status: AC
  Filled 2021-10-07: qty 20

## 2021-10-07 MED ORDER — ONDANSETRON HCL 4 MG/2ML IJ SOLN
INTRAMUSCULAR | Status: AC
  Filled 2021-10-07: qty 2

## 2021-10-07 MED ORDER — OXYCODONE HCL 5 MG PO TABS: 5.0000 mg | ORAL_TABLET | Freq: Once | ORAL | Status: DC | PRN

## 2021-10-07 MED ORDER — PROPOFOL 10 MG/ML IV BOLUS
INTRAVENOUS | Status: DC | PRN
  Administered 2021-10-07: 100 mg via INTRAVENOUS

## 2021-10-07 MED ORDER — DEXAMETHASONE SODIUM PHOSPHATE 10 MG/ML IJ SOLN
INTRAMUSCULAR | Status: DC | PRN
  Administered 2021-10-07: 5 mg via INTRAVENOUS

## 2021-10-07 MED ORDER — OXYCODONE HCL 5 MG/5ML PO SOLN: 5.0000 mg | Freq: Once | ORAL | Status: DC | PRN

## 2021-10-07 MED ORDER — FENTANYL CITRATE (PF) 100 MCG/2ML IJ SOLN
INTRAMUSCULAR | Status: AC
  Filled 2021-10-07: qty 2

## 2021-10-07 SURGICAL SUPPLY — 27 items
BAG DRAIN CYSTO-URO LG1000N (MISCELLANEOUS) ×2 IMPLANT
BASKET ZERO TIP 1.9FR (BASKET) IMPLANT
BRUSH SCRUB EZ 1% IODOPHOR (MISCELLANEOUS) ×2 IMPLANT
CATH URET FLEX-TIP 2 LUMEN 10F (CATHETERS) IMPLANT
CATH URETL OPEN END 6X70 (CATHETERS) ×1 IMPLANT
CNTNR SPEC 2.5X3XGRAD LEK (MISCELLANEOUS)
CONT SPEC 4OZ STER OR WHT (MISCELLANEOUS)
CONTAINER SPEC 2.5X3XGRAD LEK (MISCELLANEOUS) IMPLANT
DRAPE UTILITY 15X26 TOWEL STRL (DRAPES) ×2 IMPLANT
GLOVE SURG UNDER POLY LF SZ7.5 (GLOVE) ×2 IMPLANT
GOWN STRL REUS W/ TWL LRG LVL3 (GOWN DISPOSABLE) ×1 IMPLANT
GOWN STRL REUS W/ TWL XL LVL3 (GOWN DISPOSABLE) ×1 IMPLANT
GOWN STRL REUS W/TWL LRG LVL3 (GOWN DISPOSABLE) ×1
GOWN STRL REUS W/TWL XL LVL3 (GOWN DISPOSABLE) ×1
GUIDEWIRE STR DUAL SENSOR (WIRE) ×2 IMPLANT
IV NS IRRIG 3000ML ARTHROMATIC (IV SOLUTION) ×2 IMPLANT
KIT TURNOVER CYSTO (KITS) ×2 IMPLANT
PACK CYSTO AR (MISCELLANEOUS) ×2 IMPLANT
SET CYSTO W/LG BORE CLAMP LF (SET/KITS/TRAYS/PACK) ×2 IMPLANT
SHEATH URETERAL 12FRX35CM (MISCELLANEOUS) IMPLANT
STENT URET 6FRX24 CONTOUR (STENTS) IMPLANT
STENT URET 6FRX26 CONTOUR (STENTS) IMPLANT
STENT URETL SOFT 4.8X22 (STENTS) ×1 IMPLANT
SURGILUBE 2OZ TUBE FLIPTOP (MISCELLANEOUS) ×2 IMPLANT
TRACTIP FLEXIVA PULSE ID 200 (Laser) ×2 IMPLANT
VALVE UROSEAL ADJ ENDO (VALVE) IMPLANT
WATER STERILE IRR 500ML POUR (IV SOLUTION) ×2 IMPLANT

## 2021-10-07 NOTE — Op Note (Signed)
Preoperative diagnosis:  ?Left distal ureteral calculus ? ?Postoperative diagnosis:  ?Same ? ?Procedure: ? ?Cystoscopy ?Left ureteroscopy and stone removal ?Ureteroscopic laser lithotripsy ?Left ureteral stent placement (4.8 F/22 cm) ?Left retrograde pyelography with interpretation ? ?Surgeon: Verna Czech. Randie Tallarico, M.D. ? ?Anesthesia: General ? ?Complications: None ? ?Intraoperative findings:  ?Cystoscopy-bladder mucosa with patchy erythema and scattered cystitis cystica endoscopically consistent with cystitis.  Left UO normal in appearance.  Area lateral to the right hemitrigone noted to be probable site of prior right ureteral reimplant. ?Ureteroscopy-calculus identified in the distal ureter ?Left retrograde pyelogram-mild left hydronephrosis and hydroureter to the calculus.  Post procedure retrograde pyelogram showed no contrast extravasation. ? ?EBL: Minimal ? ?Specimens: ?Calculus fragments for analysis ?Urine left renal pelvis for culture ? ? ?Indication: Lindsay Dean is a 56 y.o. female admitted with a 6 mm left distal ureteral calculus.  Urinalysis showed pyuria but patient afebrile with normal lactate and no leukocytosis.  After reviewing the management options for treatment, the patient elected to proceed with the above surgical procedure(s). We have discussed the potential benefits and risks of the procedure, side effects of the proposed treatment, the likelihood of the patient achieving the goals of the procedure, and any potential problems that might occur during the procedure or recuperation. Informed consent has been obtained. ? ?Description of procedure: ? ?The patient was taken to the operating room and general anesthesia was induced.  The patient was placed in the dorsal lithotomy position, prepped and draped in the usual sterile fashion, and preoperative antibiotics were administered. A preoperative time-out was performed.  ? ?A 21 French cystoscope was lubricated and passed per urethra.   Panendoscopy was performed with findings as described above. ? ?Attention was directed to the left ureteral orifice and a 0.038 Sensor wire was then advanced up the ureter into the renal pelvis under fluoroscopic guidance.  A 5 French open-ended ureteral catheter was placed over the wire to the region of the renal pelvis and approximately 5 cc of clear urine was aspirated and sent for culture.  The guidewire was replaced and ureteral catheter removed. ? ?A 4.5 Fr semirigid ureteroscope was then advanced into the ureter next to the guidewire and the calculus was identified as described above. ? ?The stone was then fragmented with a 243 ?m holmium laser fiber on a setting of 0.3J/40 Hz ? ?All fragments were then removed from the ureter with a zero tip nitinol basket.  Reinspection of the ureter revealed no remaining visible stones or fragments.  ? ?Retrograde pyelogram was performed with findings as described above. ? ?Due to a small renal pelvis a 4.69F/22 cm Percuflex ureteral stent was placed under fluoroscopic guidance.  The wire was then removed with an adequate stent curl noted in the renal pelvis as well as in the bladder. ? ?The bladder was then emptied and the procedure ended.  The patient appeared to tolerate the procedure well and without complications.  After anesthetic reversal the patient was transported to the PACU in stable condition.  ? ?Recommendation: ?Okay for discharge 10/08/2021 if afebrile and feeling well ?Will schedule cystoscopy with stent removal in 1-2 weeks ? ? ?Irineo Axon, MD ? ?

## 2021-10-07 NOTE — Anesthesia Preprocedure Evaluation (Addendum)
Anesthesia Evaluation  ?Patient identified by MRN, date of birth, ID band ?Patient awake ? ? ? ?Reviewed: ?Allergy & Precautions, H&P , NPO status , Patient's Chart, lab work & pertinent test results, reviewed documented beta blocker date and time  ? ?Airway ?Mallampati: IV ? ?TM Distance: >3 FB ?Neck ROM: full ? ? ? Dental ?no notable dental hx. ? ?  ?Pulmonary ?neg pulmonary ROS, neg COPD, Not current smoker,  ?  ?Pulmonary exam normal ? ? ? ? ? ? ? Cardiovascular ?hypertension, Pt. on medications and Pt. on home beta blockers ?(-) angina+ Peripheral Vascular Disease  ?(-) Past MI and (-) Cardiac Stents Normal cardiovascular exam(-) dysrhythmias  ? ? ?  ?Neuro/Psych ?negative neurological ROS ? negative psych ROS  ? GI/Hepatic ?Neg liver ROS, GERD  Medicated and Controlled,  ?Endo/Other  ?diabetes, Well Controlled ? Renal/GU ?  ? ?  ?Musculoskeletal ? ? Abdominal ?(+) + obese,   ?Peds ? Hematology ? ?(+) Blood dyscrasia, anemia ,   ?Anesthesia Other Findings ?Past Medical History: ?No date: Diabetes mellitus without complication (HCC) ?    Comment:  taken off meds in 2017 ?No date: Hypertension ?No date: Lupus (HCC) ? ?Past Surgical History: ?No date: ABDOMINAL HYSTERECTOMY ? ?BMI   ? Body Mass Index: 33.97 kg/m?  ?  ? ? Reproductive/Obstetrics ?negative OB ROS ? ?  ? ? ? ? ? ? ? ? ? ? ? ? ? ?  ?  ? ? ? ? ? ? ? ?Anesthesia Physical ? ?Anesthesia Plan ? ?ASA: II ? ?Anesthesia Plan: General  ? ?Post-op Pain Management: Tylenol PO (pre-op)* and Minimal or no pain anticipated  ? ?Induction: Intravenous ? ?PONV Risk Score and Plan: Ondansetron, Dexamethasone, Midazolam and Treatment may vary due to age or medical condition ? ?Airway Management Planned: LMA ? ?Additional Equipment:  ? ?Intra-op Plan:  ? ?Post-operative Plan: Extubation in OR ? ?Informed Consent: I have reviewed the patients History and Physical, chart, labs and discussed the procedure including the risks, benefits and  alternatives for the proposed anesthesia with the patient or authorized representative who has indicated his/her understanding and acceptance.  ? ? ? ?Dental advisory given ? ?Plan Discussed with: Anesthesiologist, CRNA and Surgeon ? ?Anesthesia Plan Comments:   ? ? ? ? ? ?Anesthesia Quick Evaluation ? ?

## 2021-10-07 NOTE — Anesthesia Procedure Notes (Signed)
Procedure Name: LMA Insertion ?Date/Time: 10/07/2021 4:53 PM ?Performed by: Katherine Basset, CRNA ?Pre-anesthesia Checklist: Patient identified, Emergency Drugs available, Suction available and Patient being monitored ?Patient Re-evaluated:Patient Re-evaluated prior to induction ?Oxygen Delivery Method: Circle system utilized ?Preoxygenation: Pre-oxygenation with 100% oxygen ?Induction Type: IV induction ?Ventilation: Mask ventilation without difficulty ?LMA: LMA inserted ?LMA Size: 3.0 ?Number of attempts: 1 ?Placement Confirmation: positive ETCO2 and breath sounds checked- equal and bilateral ?Tube secured with: Tape ?Dental Injury: Teeth and Oropharynx as per pre-operative assessment  ? ? ? ? ?

## 2021-10-07 NOTE — Interval H&P Note (Signed)
History and Physical Interval Note: ? ?Afebrile, no leukocytosis.  We will proceed with ureteroscopy if urine about the stone does not appear to be purulent/turbid.  All questions were answered.  [place] interpreter was present. ? ? ?10/07/2021 ?4:33 PM ? ?Lindsay Dean  has presented today for surgery, with the diagnosis of Left Distal Ureteral Calculus.  The various methods of treatment have been discussed with the patient and family. After consideration of risks, benefits and other options for treatment, the patient has consented to  Procedure(s): ?CYSTOSCOPY/URETEROSCOPY/HOLMIUM LASER/STENT PLACEMENT (Left) as a surgical intervention.  The patient's history has been reviewed, patient examined, no change in status, stable for surgery.  I have reviewed the patient's chart and labs.  Questions were answered to the patient's satisfaction.   ? ? ?Meghin Thivierge C Magdaleno Lortie ? ? ?

## 2021-10-07 NOTE — TOC Initial Note (Signed)
Transition of Care (TOC) - Initial/Assessment Note  ? ? ?Patient Details  ?Name: Lindsay Dean ?MRN: [id number] ?Date of Birth: 02/24/1966 ? ?Transition of Care (TOC) CM/SW Contact:    ?Chapman Fitch, RN ?Phone Number: ?10/07/2021, 9:21 AM ? ?Clinical Narrative:                 ? ? ?  ?Transition of Care (TOC) Screening Note ? ? ?Patient Details  ?Name: Lindsay Dean ?Date of Birth: 1965/10/10 ? ? ?Transition of Care (TOC) CM/SW Contact:    ?Chapman Fitch, RN ?Phone Number: ?10/07/2021, 9:21 AM ? ? ? ?Transition of Care Department [facility]) has reviewed patient and no TOC needs have been identified at this time. We will continue to monitor patient advancement through interdisciplinary progression rounds. If new patient transition needs arise, please place a TOC consult. ? ? ?  ? ? ?Patient Goals and CMS Choice ?  ?  ?  ? ?Expected Discharge Plan and Services ?  ?  ?  ?  ?  ?                ?  ?  ?  ?  ?  ?  ?  ?  ?  ?  ? ?Prior Living Arrangements/Services ?  ?  ?  ?       ?  ?  ?  ?  ? ?Activities of Daily Living ?Home Assistive Devices/Equipment: None ?ADL Screening (condition at time of admission) ?Patient's cognitive ability adequate to safely complete daily activities?: Yes ?Is the patient deaf or have difficulty hearing?: No ?Does the patient have difficulty seeing, even when wearing glasses/contacts?: No ?Does the patient have difficulty concentrating, remembering, or making decisions?: No ?Patient able to express need for assistance with ADLs?: Yes ?Does the patient have difficulty dressing or bathing?: No ?Independently performs ADLs?: Yes (appropriate for developmental age) ?Does the patient have difficulty walking or climbing stairs?: No ?Weakness of Legs: None ?Weakness of Arms/Hands: None ? ?Permission Sought/Granted ?  ?  ?   ?   ?   ?   ? ?Emotional Assessment ?  ?  ?  ?  ?  ?  ? ?Admission diagnosis:  Kidney stone [N20.0] ?Ureterolithiasis [N20.1] ?Pyelonephritis [N12] ?Acute cystitis  without hematuria [N30.00] ?Patient Active Problem List  ? Diagnosis Date Noted  ? Ureterolithiasis 10/06/2021  ? Diabetes mellitus without complication (HCC)   ? Hypertension 10/28/2020  ? Obesity 10/28/2020  ? PAD (peripheral artery disease) (HCC) 10/28/2020  ? Sepsis (HCC) 05/04/2019  ? Cystocele with rectocele 05/27/2018  ? Prolapsed, uterovaginal, incomplete 05/27/2018  ? Sleep apnea 05/27/2018  ? Spondylolisthesis 05/27/2018  ? SUI (stress urinary incontinence, female) 05/27/2018  ? Back pain 03/05/2014  ? Lupus (HCC) 03/05/2014  ? 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 ?PCP:  Preston Fleeting, MD ?Pharmacy:   ?[id number] - [place] ([place]),  - [address] ?[address] ?[place] [address] ?Phone: [phone] Fax: [phone] ? ?[facility] - [place], [place] - [address] ?[address] ?[address] ?[place] [address] ?Phone: [phone] Fax: [phone] ? ? ? ? ?Social Determinants of Health (SDOH) Interventions ?  ? ?Readmission Risk Interventions ?   ? View : No data to display.  ?  ?  ?  ? ? ? ?

## 2021-10-07 NOTE — Transfer of Care (Signed)
Immediate Anesthesia Transfer of Care Note ? ?Patient: Lindsay Dean ? ?Procedure(s) Performed: CYSTOSCOPY/URETEROSCOPY/HOLMIUM LASER/STENT PLACEMENT (Left) ?CYSTOSCOPY WITH RETROGRADE PYELOGRAM (Left) ? ?Patient Location: PACU ? ?Anesthesia Type:General ? ?Level of Consciousness: drowsy ? ?Airway & Oxygen Therapy: Patient Spontanous Breathing and Patient connected to nasal cannula oxygen ? ?Post-op Assessment: Report given to RN, Post -op Vital signs reviewed and stable and Patient moving all extremities ? ?Post vital signs: Reviewed and stable ? ?Last Vitals:  ?Vitals Value Taken Time  ?BP 117/72 10/07/21 1747  ?Temp 36.2 ?C 10/07/21 1745  ?Pulse 77 10/07/21 1747  ?Resp 13 10/07/21 1747  ?SpO2 94 % 10/07/21 1747  ? ? ?Last Pain:  ?Vitals:  ? 10/07/21 1745  ?TempSrc:   ?PainSc: Asleep  ?   ? ?Patients Stated Pain Goal: 0 (10/07/21 1618) ? ?Complications: No notable events documented. ?

## 2021-10-07 NOTE — Progress Notes (Signed)
[place] [facility] ? ? ?PATIENT NAME: Lindsay Dean   ? ?MR#:  [id number] ? ?DATE OF BIRTH:  Nov 01, 1965 ? ?SUBJECTIVE:  ? ?came in with abdominal pain. She was found to have left ureteral stone. No family at bedside ? ?denies any nausea vomiting or fever ?VITALS:  ?Blood pressure 122/85, pulse (!) 59, temperature 98.4 ?F (36.9 ?C), temperature source Oral, resp. rate 17, height 4\' 9"  (1.448 m), weight 68 kg, SpO2 99 %. ? ?PHYSICAL EXAMINATION:  ? ?GENERAL:  56 y.o.-year-old patient lying in the bed with no acute distress.  ?LUNGS: Normal breath sounds bilaterally, no wheezing, rales, rhonchi.  ?CARDIOVASCULAR: S1, S2 normal. No murmurs, rubs, or gallops.  ?ABDOMEN: Soft, nontender, nondistended. Bowel sounds present.  ?EXTREMITIES: No  edema b/l.    ?NEUROLOGIC: nonfocal  patient is alert and awake ?SKIN: No obvious rash, lesion, or ulcer.  ? ?LABORATORY PANEL:  ?CBC ?Recent Labs  ?Lab 10/07/21 ?0335  ?WBC 4.6  ?HGB 10.9*  ?HCT 33.7*  ?PLT 198  ? ? ?Chemistries  ?Recent Labs  ?Lab 10/06/21 ?0814 10/07/21 ?0335  ?NA 137 138  ?K 3.5 3.2*  ?CL 108 108  ?CO2 21* 24  ?GLUCOSE 127* 101*  ?BUN 28* 27*  ?CREATININE 1.12* 1.09*  ?CALCIUM 9.0 9.0  ?MG  --  1.9  ?AST 26  --   ?ALT 18  --   ?ALKPHOS 85  --   ?BILITOT 0.7  --   ? ?Cardiac Enzymes ?No results for input(s): TROPONINI in the last 168 hours. ?RADIOLOGY:  ?CT ABDOMEN PELVIS W CONTRAST ? ?Result Date: 10/06/2021 ?CLINICAL DATA:  LEFT lower quadrant abdominal pain.  Nausea. EXAM: CT ABDOMEN AND PELVIS WITH CONTRAST TECHNIQUE: Multidetector CT imaging of the abdomen and pelvis was performed using the standard protocol following bolus administration of intravenous contrast. RADIATION DOSE REDUCTION: This exam was performed according to the departmental dose-optimization program which includes automated exposure control, adjustment of the mA and/or kV according to patient size and/or use of iterative reconstruction technique.  CONTRAST:  126mL OMNIPAQUE IOHEXOL 300 MG/ML  SOLN COMPARISON:  CT abdomen dated 07/04/2019 FINDINGS: Lower chest: No acute/significant abnormality. Hepatobiliary: Status post cholecystectomy. No focal liver abnormality is seen. No bile duct dilatation. Pancreas: Unremarkable. No pancreatic ductal dilatation or surrounding inflammatory changes. Spleen: Normal in size without focal abnormality. Adrenals/Urinary Tract: Acute appearing LEFT-sided hydronephrosis, at least moderate in degree, with associated perinephric and periureteral inflammation. 6 mm obstructing stone within the distal LEFT ureter, just proximal to the LEFT UVJ. Additional RIGHT-sided hydronephrosis, also moderate in degree, presumably chronic. No RIGHT-sided ureteral stone. Bladder is decompressed. Stomach/Bowel: No dilated large or small bowel loops. No evidence of focal bowel wall inflammation. Stomach is unremarkable, partially decompressed. Appendix is normal. Scattered diverticulosis throughout the colon but no focal inflammatory change to suggest acute diverticulitis. Vascular/Lymphatic: No acute or significant vascular findings. No enlarged lymph nodes are seen within the abdomen or pelvis. Reproductive: Presumed hysterectomy.  No adnexal mass or free fluid. Other: No free fluid or abscess collection is seen. No free intraperitoneal air. Musculoskeletal: Degenerative spondylosis at the L4-5 and L5-S1 levels, at least moderate in degree with associated disc space narrowing, vacuum disc phenomenon and osseous spurring. 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 No acute-appearing osseous abnormality. IMPRESSION: 1. 6 mm obstructing stone within the distal LEFT ureter, located just proximal to the LEFT UVJ, resulting in moderate LEFT-sided hydronephrosis and associated perinephric and periureteral inflammation. 2. Additional RIGHT-sided hydronephrosis,  also moderate in degree, presumably chronic. No RIGHT-sided ureteral stone. 3.  Colonic diverticulosis without evidence of acute diverticulitis. No bowel obstruction. 4. 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 5. Degenerative spondylosis at the L4-5 and L5-S1 levels, at least moderate in degree, as detailed above. Electronically Signed   By: Franki Cabot M.D.   On: 10/06/2021 09:28   ? ?Assessment and Plan ?Lindsay Dean is a 56 yo female with PMH DMII, HTN, Lupus (off imuran about 1 year per rheumatology) who presented with lower abdominal pain and nausea. She endorsed associated nausea but no vomiting. She has a history of UTI approximately 3 months ago but denies prior history of obstructed kidney stones.  ?CT A/P showed a 30mm obstructing left ureteral stone proximal to the UVJ and resulting in moderate hydronephrosis.  ? ?Left Ureterolithiasis ?- CT shows 6 mm obstructing Left ureteral stone with moderate hydronephrosis; also shows R moderate hydronephrosis but no obstructing stone seen ?- UA concerning for possible infection in setting of obstruction; continue Rocephin and follow up cultures ?- urology consulted for possible stent placement and further evaluation--Dr [facility]  ?  ?Diabetes mellitus without complication (HCC) ?- 123456 5.9% ?- continue on SSI and CBG monitoring for now ?  ?Lupus ([place]) ?- no longer on imuran; she has seen rheum and says she's been off imuran for about 1 year ?  ?  ?  ?Code Status: Full    ?DVT Prophylaxis: SCD   ?  ?Anticipated disposition is to: Home ? ? ?Procedures: ?Family communication :none today ?Consults : urology Dr. Bernardo Heater ?CODE STATUS: full ?DVT Prophylaxis :scd ?Level of care: Med-Surg ?Status is: Inpatient ?Remains inpatient appropriate because: urological procedure today. ? ?Anticipate discharge tomorrow if remains stable ?  ? ?TOTAL TIME TAKING CARE OF THIS PATIENT: 25 minutes.  ?>50% time spent on counselling and coordination of care ? ?Note: This dictation was prepared with  Dragon dictation along with smaller phrase technology. Any transcriptional errors that result from this process are unintentional. ? ?Fritzi Mandes M.D  ? ? ?[facility]  ? ?CC: ?Primary care physician; [name], MD  ?

## 2021-10-07 NOTE — Anesthesia Postprocedure Evaluation (Signed)
Anesthesia Post Note ? ?Patient: Lindsay Dean ? ?Procedure(s) Performed: CYSTOSCOPY/URETEROSCOPY/HOLMIUM LASER/STENT PLACEMENT (Left) ?CYSTOSCOPY WITH RETROGRADE PYELOGRAM (Left) ? ?Patient location during evaluation: PACU ?Anesthesia Type: General ?Level of consciousness: awake and alert ?Pain management: pain level controlled ?Vital Signs Assessment: post-procedure vital signs reviewed and stable ?Respiratory status: spontaneous breathing, nonlabored ventilation and respiratory function stable ?Cardiovascular status: blood pressure returned to baseline and stable ?Postop Assessment: no apparent nausea or vomiting ?Anesthetic complications: no ? ? ?No notable events documented. ? ? ?Last Vitals:  ?Vitals:  ? 10/07/21 1800 10/07/21 1815  ?BP: 114/74 127/78  ?Pulse: 80 84  ?Resp: 17 18  ?Temp:  (!) 36.3 ?C  ?SpO2: 94% 97%  ?  ?Last Pain:  ?Vitals:  ? 10/07/21 1815  ?TempSrc:   ?PainSc: 0-No pain  ? ? ?  ?  ?  ?  ?  ?  ? ?Foye Deer ? ? ? ? ?

## 2021-10-08 ENCOUNTER — Encounter: Payer: Self-pay | Admitting: Urology

## 2021-10-08 ENCOUNTER — Other Ambulatory Visit: Payer: Self-pay | Admitting: Family Medicine

## 2021-10-08 LAB — CBC WITH DIFFERENTIAL/PLATELET
Abs Immature Granulocytes: 0.02 10*3/uL (ref 0.00–0.07)
Basophils Absolute: 0 10*3/uL (ref 0.0–0.1)
Basophils Relative: 0 %
Eosinophils Absolute: 0 10*3/uL (ref 0.0–0.5)
Eosinophils Relative: 0 %
HCT: 32.9 % — ABNORMAL LOW (ref 36.0–46.0)
Hemoglobin: 11.1 g/dL — ABNORMAL LOW (ref 12.0–15.0)
Immature Granulocytes: 0 %
Lymphocytes Relative: 20 %
Lymphs Abs: 1 10*3/uL (ref 0.7–4.0)
MCH: 31.2 pg (ref 26.0–34.0)
MCHC: 33.7 g/dL (ref 30.0–36.0)
MCV: 92.4 fL (ref 80.0–100.0)
Monocytes Absolute: 0.1 10*3/uL (ref 0.1–1.0)
Monocytes Relative: 2 %
Neutro Abs: 3.7 10*3/uL (ref 1.7–7.7)
Neutrophils Relative %: 78 %
Platelets: 213 10*3/uL (ref 150–400)
RBC: 3.56 MIL/uL — ABNORMAL LOW (ref 3.87–5.11)
RDW: 12.6 % (ref 11.5–15.5)
WBC: 4.8 10*3/uL (ref 4.0–10.5)
nRBC: 0 % (ref 0.0–0.2)

## 2021-10-08 LAB — BASIC METABOLIC PANEL
Anion gap: 6 (ref 5–15)
BUN: 27 mg/dL — ABNORMAL HIGH (ref 6–20)
CO2: 22 mmol/L (ref 22–32)
Calcium: 9.1 mg/dL (ref 8.9–10.3)
Chloride: 108 mmol/L (ref 98–111)
Creatinine, Ser: 0.94 mg/dL (ref 0.44–1.00)
GFR, Estimated: >60 mL/min (ref >60)
Glucose, Bld: 186 mg/dL — ABNORMAL HIGH (ref 70–99)
Potassium: 3.8 mmol/L (ref 3.5–5.1)
Sodium: 136 mmol/L (ref 135–145)

## 2021-10-08 LAB — MAGNESIUM: Magnesium: 2 mg/dL (ref 1.7–2.4)

## 2021-10-08 LAB — GLUCOSE, CAPILLARY
Glucose-Capillary: 120 mg/dL — ABNORMAL HIGH (ref 70–99)
Glucose-Capillary: 128 mg/dL — ABNORMAL HIGH (ref 70–99)
Glucose-Capillary: 170 mg/dL — ABNORMAL HIGH (ref 70–99)

## 2021-10-08 MED ORDER — CEPHALEXIN 500 MG PO CAPS
500.0000 mg | ORAL_CAPSULE | Freq: Four times a day (QID) | ORAL | 0 refills | Status: AC
Start: 2021-10-08 — End: 2021-10-13

## 2021-10-08 MED ORDER — CEPHALEXIN 500 MG PO CAPS
500.0000 mg | ORAL_CAPSULE | Freq: Four times a day (QID) | ORAL | Status: DC
  Administered 2021-10-08: 500 mg via ORAL
  Filled 2021-10-08: qty 1

## 2021-10-08 MED ORDER — ATORVASTATIN CALCIUM 20 MG PO TABS
20.0000 mg | ORAL_TABLET | Freq: Every day | ORAL | Status: DC
Start: 2021-10-08 — End: 2021-10-08
  Administered 2021-10-08: 20 mg via ORAL
  Filled 2021-10-08: qty 1

## 2021-10-08 MED ORDER — CEPHALEXIN 500 MG PO CAPS: 500.0000 mg | ORAL_CAPSULE | Freq: Four times a day (QID) | ORAL | Status: DC

## 2021-10-08 MED ORDER — ATENOLOL 50 MG PO TABS
50.0000 mg | ORAL_TABLET | Freq: Every day | ORAL | Status: DC
  Administered 2021-10-08: 50 mg via ORAL
  Filled 2021-10-08: qty 1

## 2021-10-08 MED ORDER — PANTOPRAZOLE SODIUM 40 MG PO TBEC
40.0000 mg | DELAYED_RELEASE_TABLET | Freq: Every day | ORAL | Status: DC
  Administered 2021-10-08: 40 mg via ORAL
  Filled 2021-10-08: qty 1

## 2021-10-08 MED ORDER — IBUPROFEN 800 MG PO TABS: 800.0000 mg | ORAL_TABLET | Freq: Every day | ORAL | 0 refills | Status: DC | PRN

## 2021-10-08 NOTE — Plan of Care (Signed)
Patient discharged to home with family.  Discharge instructions reviewed with patient using [place] interpreter.  Patient verbalized understanding and appreciaition.  Piv removed. ? ?

## 2021-10-08 NOTE — Discharge Summary (Signed)
?Physician Discharge Summary ?  ?Patient: Lindsay Dean MRN: [id number] DOB: 29-Jul-1965  ?Admit date:     10/06/2021  ?Discharge date: 10/08/21  ?Discharge Physician: Fritzi Mandes  ? ?PCP: [name], MD  ? ?Recommendations at discharge:  ? ? F/u Dr Bernardo Heater on 10/22/2021 at 9 am for f/u and possible stent removal ? ?Discharge Diagnoses: ?Left ureterolithiasis s/p left ureteral stent placement and stone removal ?Acute cystitis ? ?Hospital Course: ?Ms. Marlowe Sax is a 56 yo female with PMH DMII, HTN, Lupus (off imuran about 1 year per rheumatology) who presented with lower abdominal pain and nausea. She endorsed associated nausea but no vomiting. She has a history of UTI approximately 3 months ago but denies prior history of obstructed kidney stones.  ?CT A/P showed a 55mm obstructing left ureteral stone proximal to the UVJ and resulting in moderate hydronephrosis.  ?  ?Left Ureterolithiasis ?- CT shows 6 mm obstructing Left ureteral stone with moderate hydronephrosis; also shows R moderate hydronephrosis but no obstructing stone seen ?- UA concerning for possible infection in setting of obstruction; continue Rocephin and follow up cultures ?- urology consulted s/p left ureteral stent placement with stone removal  by -Dr Bernardo Heater  ?--change to po abxs ?--ok to d/c home from urology and f/u out pt may 3 rd ?  ?Diabetes mellitus without complication (HCC) ?- 123456 5.9% ?- continue on SSI and CBG monitoring for now ?  ?Lupus ([place]) ?- no longer on imuran; she has seen rheum and says she's been off imuran for about 1 year ? ?HTN ?--cont home meds ?  ?  ?  ?Code Status: Full    ?DVT Prophylaxis: SCD   ?  ?Anticipated disposition is to: Home today. Pt agreeable ?  ?  ?Procedures: Cystoscopy ?Left ureteroscopy and stone removal ?Ureteroscopic laser lithotripsy ?Left ureteral stent placement (4.8 F/22 cm) ?Left retrograde pyelography with interpretation ?Family communication :none today ?Consults : urology Dr.  Bernardo Heater ?CODE STATUS: full ?DVT Prophylaxis :scd ?  ? ? ?Disposition: Home ?Diet recommendation:  ?Discharge Diet Orders (From admission, onward)  ? ?  Start     Ordered  ? 10/08/21 0000  Diet - low sodium heart healthy       ? 10/08/21 1016  ? ?  ?  ? ?  ? ?Cardiac and Carb modified diet ?DISCHARGE MEDICATION: ?Allergies as of 10/08/2021   ? ?   Reactions  ? Tramadol Nausea Only  ? Other reaction(s): Dizziness  ? ?  ? ?  ?Medication List  ?  ? ?STOP taking these medications   ? ?acetaminophen 500 MG tablet ?Commonly known as: TYLENOL ?  ? ?  ? ?TAKE these medications   ? ?atenolol 50 MG tablet ?Commonly known as: TENORMIN ?Take 50 mg by mouth daily. ?  ?atorvastatin 20 MG tablet ?Commonly known as: LIPITOR ?Take 20 mg by mouth daily. ?  ?cephALEXin 500 MG capsule ?Commonly known as: KEFLEX ?Take 1 capsule (500 mg total) by mouth every 6 (six) hours for 5 days. ?  ?hydrochlorothiazide 25 MG tablet ?Commonly known as: HYDRODIURIL ?Take 25 mg by mouth daily. ?  ?ibuprofen 800 MG tablet ?Commonly known as: ADVIL ?Take 1 tablet (800 mg total) by mouth daily as needed. ?What changed:  ?when to take this ?reasons to take this ?  ?losartan 100 MG tablet ?Commonly known as: COZAAR ?Take 100 mg by mouth daily. ?  ?omeprazole 20 MG capsule ?Commonly known as: PRILOSEC ?Take 20 mg by mouth daily. ?  ? ?  ? ?  ?  ? ? ?  ?  Discharge Care Instructions  ?(From admission, onward)  ?  ? ? ?  ? ?  Start     Ordered  ? 10/08/21 0000  Discharge wound care:       ?Comments: none  ? 10/08/21 1016  ? ?  ?  ? ?  ? ? Follow-up Information   ? ? Revelo, Elyse Jarvis, MD. Schedule an appointment as soon as possible for a visit in 1 week(s).   ?Specialty: Family Medicine ?Contact information: ?[address] ?[address] ?[place] [address] ?[phone] ? ? ?  ?  ? ? Abbie Sons, MD. Daphane Shepherd on 10/22/2021.   ?Specialty: Urology ?Why: at 9 am for hospital f/u and possible  stent removal ?Contact information: ?[address] ?[address] ?[place] [address] ?[phone] ? ? ?  ?  ? ?  ?  ? ?  ? ?Discharge Exam: ?Filed Weights  ? 10/06/21 0813 10/07/21 1618  ?Weight: 68 kg 68 kg  ? ? ? ?Condition at discharge: fair ? ?The results of significant diagnostics from this hospitalization (including imaging, microbiology, ancillary and laboratory) are listed below for reference.  ? ?Imaging Studies: ?CT ABDOMEN PELVIS W CONTRAST ? ?Result Date: 10/06/2021 ?CLINICAL DATA:  LEFT lower quadrant abdominal pain.  Nausea. EXAM: CT ABDOMEN AND PELVIS WITH CONTRAST TECHNIQUE: Multidetector CT imaging of the abdomen and pelvis was performed using the standard protocol following bolus administration of intravenous contrast. RADIATION DOSE REDUCTION: This exam was performed according to the departmental dose-optimization program which includes automated exposure control, adjustment of the mA and/or kV according to patient size and/or use of iterative reconstruction technique. CONTRAST:  143mL OMNIPAQUE IOHEXOL 300 MG/ML  SOLN COMPARISON:  CT abdomen dated 07/04/2019 FINDINGS: Lower chest: No acute/significant abnormality. Hepatobiliary: Status post cholecystectomy. No focal liver abnormality is seen. No bile duct dilatation. Pancreas: Unremarkable. No pancreatic ductal dilatation or surrounding inflammatory changes. Spleen: Normal in size without focal abnormality. Adrenals/Urinary Tract: Acute appearing LEFT-sided hydronephrosis, at least moderate in degree, with associated perinephric and periureteral inflammation. 6 mm obstructing stone within the distal LEFT ureter, just proximal to the LEFT UVJ. Additional RIGHT-sided hydronephrosis, also moderate in degree, presumably chronic. No RIGHT-sided ureteral stone. Bladder is decompressed. Stomach/Bowel: No dilated large or small bowel loops. No evidence of focal bowel wall inflammation. Stomach is unremarkable, partially decompressed. Appendix is normal. Scattered diverticulosis throughout the colon but no focal  inflammatory change to suggest acute diverticulitis. Vascular/Lymphatic: No acute or significant vascular findings. No enlarged lymph nodes are seen within the abdomen or pelvis. Reproductive: Presumed hysterectomy.  No adnexal mass or free fluid. Other: No free fluid or abscess collection is seen. No free intraperitoneal air. Musculoskeletal: Degenerative spondylosis at the L4-5 and L5-S1 levels, at least moderate in degree with associated disc space narrowing, vacuum disc phenomenon and osseous spurring. 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 No acute-appearing osseous abnormality. IMPRESSION: 1. 6 mm obstructing stone within the distal LEFT ureter, located just proximal to the LEFT UVJ, resulting in moderate LEFT-sided hydronephrosis and associated perinephric and periureteral inflammation. 2. Additional RIGHT-sided hydronephrosis, also moderate in degree, presumably chronic. No RIGHT-sided ureteral stone. 3. Colonic diverticulosis without evidence of acute diverticulitis. No bowel obstruction. 4. 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 5. Degenerative spondylosis at the L4-5 and L5-S1 levels, at least moderate in degree, as detailed above. Electronically Signed   By: Franki Cabot M.D.   On: 10/06/2021 09:28  ? ?  DG OR UROLOGY CYSTO IMAGE ([place]) ? ?Result Date: 10/07/2021 ?There is no interpretation for this exam.  This order is for images obtained during a surgical procedure.  Please See "Surgeries" Tab for more information regarding the procedure.   ? ?Microbiology: ?Results for orders placed or performed during the hospital encounter of 10/06/21  ?Urine Culture     Status: Abnormal  ? Collection Time: 10/06/21  8:38 AM  ? Specimen: Urine, Random  ?Result Value Ref Range Status  ? Specimen Description   Final  ?  URINE, RANDOM ?Performed at [facility], [address], [place], [place] [address] ?  ? Special  Requests   Final  ?  NONE ?Performed at [facility], [address], [place], [place] [address] ?  ? Culture (A)  Final  ?  <10,000 COLONIES/mL INSIGNIFICANT GROWTH ?Performed at [facility], [place]

## 2021-10-08 NOTE — Progress Notes (Signed)
Urology Consult Follow Up ? ?Subjective: ?POD # 1 cystoscopy, left ureteroscopy and stone removal, uroscopic laser lithotripsy, left ureteral stent placement and left retrograde pyelogram ? ?Seen using interpretive services, [facility].   ? ?She is having some stent discomfort this morning with dysuria. ? ?VSS afebrile ? ?WBC count and serum creatinine was normal this a.m. stone analysis and urine culture are pending. ? ? ?Anti-infectives: ?Anti-infectives (From admission, onward)  ? ? Start     Dose/Rate Route Frequency Ordered Stop  ? 10/08/21 1200  cephALEXin (KEFLEX) capsule 500 mg  Status:  Discontinued       ? 500 mg Oral Every 6 hours 10/08/21 0813 10/08/21 0814  ? 10/08/21 1200  cephALEXin (KEFLEX) capsule 500 mg       ? 500 mg Oral Every 6 hours 10/08/21 0814 10/13/21 1159  ? 10/07/21 1000  cefTRIAXone (ROCEPHIN) 1 g in sodium chloride 0.9 % 100 mL IVPB  Status:  Discontinued       ? 1 g ?200 mL/hr over 30 Minutes Intravenous Every 24 hours 10/06/21 1016 10/08/21 0813  ? 10/06/21 0930  cefTRIAXone (ROCEPHIN) 1 g in sodium chloride 0.9 % 100 mL IVPB       ? 1 g ?200 mL/hr over 30 Minutes Intravenous  Once 10/06/21 0929 10/06/21 1033  ? ?  ? ? ?Current Facility-Administered Medications  ?Medication Dose Route Frequency Provider Last Rate Last Admin  ? acetaminophen (TYLENOL) tablet 650 mg  650 mg Oral Q6H PRN Lewie Chamber, MD   650 mg at 10/06/21 2031  ? Or  ? acetaminophen (TYLENOL) suppository 650 mg  650 mg Rectal Q6H PRN Lewie Chamber, MD      ? atenolol (TENORMIN) tablet 50 mg  50 mg Oral Daily Enedina Finner, MD      ? atorvastatin (LIPITOR) tablet 20 mg  20 mg Oral Daily Enedina Finner, MD      ? cephALEXin (KEFLEX) capsule 500 mg  500 mg Oral Q6H Enedina Finner, MD      ? insulin aspart (novoLOG) injection 0-5 Units  0-5 Units Subcutaneous Axel Filler, MD   2 Units at 10/07/21 2039  ? insulin aspart (novoLOG) injection 0-9 Units  0-9 Units Subcutaneous TID WC Lewie Chamber, MD      ? morphine (PF) 2  MG/ML injection 2 mg  2 mg Intravenous Q2H PRN Lewie Chamber, MD   2 mg at 10/08/21 0432  ? pantoprazole (PROTONIX) EC tablet 40 mg  40 mg Oral Daily Enedina Finner, MD      ? senna-docusate (Senokot-S) tablet 1 tablet  1 tablet Oral QHS PRN Lewie Chamber, MD      ? sodium chloride flush (NS) 0.9 % injection 3 mL  3 mL Intravenous Orlene Erm, MD   3 mL at 10/07/21 2039  ? ? ? ?Objective: ?Vital signs in last 24 hours: ?Temp:  [97.2 ?F (36.2 ?C)-98.4 ?F (36.9 ?C)] 97.9 ?F (36.6 ?C) (04/19 5138) ?Pulse Rate:  [66-84] 79 (04/19 0807) ?Resp:  [13-20] 18 (04/19 0807) ?BP: (114-143)/(72-84) 138/82 (04/19 8752) ?SpO2:  [93 %-100 %] 97 % (04/19 0807) ?Weight:  [68 kg] 68 kg (04/18 1618) ? ?Intake/Output from previous day: ?04/18 0701 - 04/19 0700 ?In: 740 [P.O.:240; I.V.:500] ?Out: 0  ?Intake/Output this shift: ?No intake/output data recorded. ? ? ?Physical Exam ?Constitutional:   ?   Appearance: She is well-developed and normal weight.  ?HENT:  ?   Head: Normocephalic and atraumatic.  ?Eyes:  ?  Extraocular Movements: Extraocular movements intact.  ?   Pupils: Pupils are equal, round, and reactive to light.  ?Abdominal:  ?   General: Abdomen is flat.  ?Skin: ?   General: Skin is warm and dry.  ?Neurological:  ?   General: No focal deficit present.  ?   Mental Status: She is alert and oriented to person, place, and time.  ?Psychiatric:     ?   Mood and Affect: Mood normal.     ?   Behavior: Behavior normal.  ? ? ?Lab Results:  ?Recent Labs  ?  10/07/21 ?9528 10/08/21 ?4132  ?WBC 4.6 4.8  ?HGB 10.9* 11.1*  ?HCT 33.7* 32.9*  ?PLT 198 213  ? ?BMET ?Recent Labs  ?  10/07/21 ?4401 10/08/21 ?0272  ?NA 138 136  ?K 3.2* 3.8  ?CL 108 108  ?CO2 24 22  ?GLUCOSE 101* 186*  ?BUN 27* 27*  ?CREATININE 1.09* 0.94  ?CALCIUM 9.0 9.1  ? ?PT/INR ?No results for input(s): LABPROT, INR in the last 72 hours. ?ABG ?No results for input(s): PHART, HCO3 in the last 72 hours. ? ?Invalid input(s): PCO2, PO2 ? ?Studies/Results: ?CT ABDOMEN  PELVIS W CONTRAST ? ?Result Date: 10/06/2021 ?CLINICAL DATA:  LEFT lower quadrant abdominal pain.  Nausea. EXAM: CT ABDOMEN AND PELVIS WITH CONTRAST TECHNIQUE: Multidetector CT imaging of the abdomen and pelvis was performed using the standard protocol following bolus administration of intravenous contrast. RADIATION DOSE REDUCTION: This exam was performed according to the departmental dose-optimization program which includes automated exposure control, adjustment of the mA and/or kV according to patient size and/or use of iterative reconstruction technique. CONTRAST:  135mL OMNIPAQUE IOHEXOL 300 MG/ML  SOLN COMPARISON:  CT abdomen dated 07/04/2019 FINDINGS: Lower chest: No acute/significant abnormality. Hepatobiliary: Status post cholecystectomy. No focal liver abnormality is seen. No bile duct dilatation. Pancreas: Unremarkable. No pancreatic ductal dilatation or surrounding inflammatory changes. Spleen: Normal in size without focal abnormality. Adrenals/Urinary Tract: Acute appearing LEFT-sided hydronephrosis, at least moderate in degree, with associated perinephric and periureteral inflammation. 6 mm obstructing stone within the distal LEFT ureter, just proximal to the LEFT UVJ. Additional RIGHT-sided hydronephrosis, also moderate in degree, presumably chronic. No RIGHT-sided ureteral stone. Bladder is decompressed. Stomach/Bowel: No dilated large or small bowel loops. No evidence of focal bowel wall inflammation. Stomach is unremarkable, partially decompressed. Appendix is normal. Scattered diverticulosis throughout the colon but no focal inflammatory change to suggest acute diverticulitis. Vascular/Lymphatic: No acute or significant vascular findings. No enlarged lymph nodes are seen within the abdomen or pelvis. Reproductive: Presumed hysterectomy.  No adnexal mass or free fluid. Other: No free fluid or abscess collection is seen. No free intraperitoneal air. Musculoskeletal: Degenerative spondylosis at the  L4-5 and L5-S1 levels, at least moderate in degree with associated disc space narrowing, vacuum disc phenomenon and osseous spurring. 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 No acute-appearing osseous abnormality. IMPRESSION: 1. 6 mm obstructing stone within the distal LEFT ureter, located just proximal to the LEFT UVJ, resulting in moderate LEFT-sided hydronephrosis and associated perinephric and periureteral inflammation. 2. Additional RIGHT-sided hydronephrosis, also moderate in degree, presumably chronic. No RIGHT-sided ureteral stone. 3. Colonic diverticulosis without evidence of acute diverticulitis. No bowel obstruction. 4. 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 5. Degenerative spondylosis at the L4-5 and L5-S1 levels, at least moderate in degree, as detailed above. Electronically Signed   By: Franki Cabot M.D.   On: 10/06/2021 09:28  ? ?DG  OR UROLOGY CYSTO IMAGE ([facility]) ? ?Result Date: 10/07/2021 ?There is no interpretation for this exam.  This order is for images obtained during a surgical procedure.  Please See "Surgeries" Tab for more information regarding the procedure.   ? ? ?Assessment and Plan: ?56 year old female with a history of nephrolithiasis and history of right distal ureteral injury during a transvaginal hysterectomy with urogynecology at [facility] in 2019 who underwent cystoscopy, left ureteroscopy and stone removal, uroscopic laser lithotripsy, left ureteral stent placement and left retrograde pyelogram through the interpretation on October 07, 2021 for severe renal colic. ? ?Recommendations: ?-Okay for discharge when criteria met ?-tamsulosin 0.4 mg daily and oxybutynin IR 5 mg every 8 hours as needed for bladder spasm, stent irritation ?-Will schedule cystoscopy with stent removal in 1 to 2 weeks ? ? ? ? LOS: 2 days  ? ? ?[place] [place] ?10/08/2021  ?

## 2021-10-09 LAB — URINE CULTURE: Culture: NO GROWTH

## 2021-10-11 LAB — CULTURE, BLOOD (ROUTINE X 2)
Culture: NO GROWTH
Culture: NO GROWTH
Special Requests: ADEQUATE

## 2021-10-12 LAB — CALCULI, WITH PHOTOGRAPH (CLINICAL LAB)
Calcium Oxalate Dihydrate: 20 %
Calcium Oxalate Monohydrate: 75 %
Hydroxyapatite: 5 %
Weight Calculi: 57 mg

## 2021-10-22 ENCOUNTER — Encounter: Payer: Self-pay | Admitting: Urology

## 2021-10-22 ENCOUNTER — Ambulatory Visit (INDEPENDENT_AMBULATORY_CARE_PROVIDER_SITE_OTHER): Payer: Medicaid Other | Admitting: Urology

## 2021-10-22 VITALS — BP 117/81 | HR 65 | Ht 59.0 in | Wt 148.0 lb

## 2021-10-22 DIAGNOSIS — N200 Calculus of kidney: Secondary | ICD-10-CM

## 2021-10-22 DIAGNOSIS — R8281 Pyuria: Secondary | ICD-10-CM | POA: Diagnosis not present

## 2021-10-22 LAB — MICROSCOPIC EXAMINATION
RBC, Urine: >30 /hpf — AB (ref 0–2)
WBC, UA: >30 /hpf — AB (ref 0–5)

## 2021-10-22 LAB — URINALYSIS, COMPLETE
Bilirubin, UA: NEGATIVE
Glucose, UA: NEGATIVE
Nitrite, UA: POSITIVE — AB
Specific Gravity, UA: 1.025 (ref 1.005–1.030)
Urobilinogen, Ur: 1 mg/dL (ref 0.2–1.0)
pH, UA: 6 (ref 5.0–7.5)

## 2021-10-22 MED ORDER — CEPHALEXIN 250 MG PO CAPS
500.0000 mg | ORAL_CAPSULE | Freq: Once | ORAL | Status: AC
  Administered 2021-10-22: 500 mg via ORAL

## 2021-10-22 MED ORDER — CIPROFLOXACIN HCL 500 MG PO TABS: 500.0000 mg | ORAL_TABLET | Freq: Once | ORAL | Status: DC

## 2021-10-22 NOTE — Progress Notes (Signed)
Patient was scheduled for stent removal however urinalysis with significant pyuria and was nitrite positive.  Urine culture was ordered.  Will start empirically on Septra DS and reschedule stent removal for next week ?

## 2021-10-22 NOTE — Progress Notes (Signed)
? ? ? ? ?MRN : [id number] ? ?Lindsay Dean is a 56 y.o. (03/27/1966) female who presents with chief complaint of check circulation. ? ?History of Present Illness:  ? ?The patient is seen for evaluation of painful lower extremities. Patient notes the pain is variable and not always associated with activity.  The pain is somewhat consistent day to day occurring on most days. The patient notes the pain also occurs with standing and routinely seems worse as the day wears on. The pain has been progressive over the past several years. The patient states these symptoms are causing  a profound negative impact on quality of life and daily activities. ?  ?The patient denies rest pain or dangling of an extremity off the side of the bed during the night for relief. ?No open wounds or sores at this time. ?No history of DVT or phlebitis. ?No prior interventions or surgeries. ?  ?There is a  history of back problems and DJD of the lumbar and sacral spine.  ?  ?ABI's Rt=1.20 and Lt=[place] (triphasic signals bilaterally) (Previous ABI Rt=1.07 and Lt=1.13) ? ?No outpatient medications have been marked as taking for the 10/23/21 encounter (Appointment) with Gilda CreaseSchnier, Latina CraverGregory G, MD.  ? ? ?Past Medical History:  ?Diagnosis Date  ? Diabetes mellitus without complication (HCC)   ? taken off meds in 2017  ? Hypertension   ? Lupus (HCC)   ? ? ?Past Surgical History:  ?Procedure Laterality Date  ? ABDOMINAL HYSTERECTOMY    ? CYSTOSCOPY W/ RETROGRADES Left 10/07/2021  ? Procedure: CYSTOSCOPY WITH RETROGRADE PYELOGRAM;  Surgeon: Riki AltesStoioff, Scott C, MD;  Location: [facility] OR[facility];  Service: Urology;  Laterality: Left;  ? CYSTOSCOPY WITH STENT PLACEMENT Right 05/04/2019  ? Procedure: CYSTOSCOPY with attempt of stent placement;  Surgeon: Sondra ComeSninsky, Brian C, MD;  Location: [facility];  Service: Urology;  Laterality: Right;  ? CYSTOSCOPY/URETEROSCOPY/HOLMIUM LASER/STENT PLACEMENT Left 10/07/2021  ? Procedure: CYSTOSCOPY/URETEROSCOPY/HOLMIUM LASER/STENT PLACEMENT;   Surgeon: Riki AltesStoioff, Scott C, MD;  Location: [facility];  Service: Urology;  Laterality: Left;  ? ? ?Social History ?Social History  ? ?Tobacco Use  ? Smoking status: Never  ? Smokeless tobacco: Never  ?Substance Use Topics  ? Alcohol use: No  ? Drug use: No  ? ? ?Family History ?Family History  ?Problem Relation Age of Onset  ? Diabetes Mother   ? Heart disease Mother   ? Hypertension Mother   ? Arthritis Mother   ? Hypertension Father   ? Lung disease Father   ? ? ?Allergies  ?Allergen Reactions  ? Tramadol Nausea Only  ?  Other reaction(s): Dizziness  ? ? ? ?REVIEW OF SYSTEMS (Negative unless checked) ? ?Constitutional: [] Weight loss  [] Fever  [] Chills ?Cardiac: [] Chest pain   [] Chest pressure   [] Palpitations   [] Shortness of breath when laying flat   [] Shortness of breath with exertion. ?Vascular:  [x] Pain in legs with walking   [] Pain in legs at rest  [] History of DVT   [] Phlebitis   [] Swelling in legs   [] Varicose veins   [] Non-healing ulcers ?Pulmonary:   [] Uses home oxygen   [] Productive cough   [] Hemoptysis   [] Wheeze  [] COPD   [] Asthma ?Neurologic:  [] Dizziness   [] Seizures   [] History of stroke   [] History of TIA  [] Aphasia   [] Vissual changes   [] Weakness or numbness in arm   [] Weakness or numbness in leg ?Musculoskeletal:   [] Joint swelling   [] Joint pain   [] Low back pain ?Hematologic:  [] Easy bruising  []   Easy bleeding   [] Hypercoagulable state   [] Anemic ?Gastrointestinal:  [] Diarrhea   [] Vomiting  [] Gastroesophageal reflux/heartburn   [] Difficulty swallowing. ?Genitourinary:  [] Chronic kidney disease   [] Difficult urination  [] Frequent urination   [] Blood in urine ?Skin:  [] Rashes   [] Ulcers  ?Psychological:  [] History of anxiety   []  History of major depression. ? ?Physical Examination ? ?There were no vitals filed for this visit. ?There is no height or weight on file to calculate BMI. ?Gen: WD/WN, NAD ?Head: [place]/AT, No temporalis wasting.  ?Ear/Nose/Throat: Hearing grossly intact, nares w/o erythema or  drainage ?Eyes: PER, EOMI, sclera nonicteric.  ?Neck: Supple, no masses.  No bruit or JVD.  ?Pulmonary:  Good air movement, no audible wheezing, no use of accessory muscles.  ?Cardiac: RRR, normal S1, S2, no Murmurs. ?Vascular:  mild trophic changes, no open wounds ?Vessel Right Left  ?Radial Palpable Palpable  ?PT Not Palpable Not Palpable  ?DP Not Palpable Not Palpable  ?Gastrointestinal: soft, non-distended. No guarding/no peritoneal signs.  ?Musculoskeletal: M/S 5/5 throughout.  No visible deformity.  ?Neurologic: CN 2-12 intact. Pain and light touch intact in extremities.  Symmetrical.  Speech is fluent. Motor exam as listed above. ?Psychiatric: Judgment intact, Mood & affect appropriate for pt's clinical situation. ?Dermatologic: No rashes or ulcers noted.  No changes consistent with cellulitis. ? ? ?CBC ?Lab Results  ?Component Value Date  ? WBC 4.8 10/08/2021  ? HGB 11.1 (L) 10/08/2021  ? HCT 32.9 (L) 10/08/2021  ? MCV 92.4 10/08/2021  ? PLT 213 10/08/2021  ? ? ?BMET ?   ?Component Value Date/Time  ? NA 136 10/08/2021 0337  ? K 3.8 10/08/2021 0337  ? CL 108 10/08/2021 0337  ? CO2 22 10/08/2021 0337  ? GLUCOSE 186 (H) 10/08/2021  ? BUN 27 (H) 10/08/2021  ? CREATININE 0.94 10/08/2021 0337  ? CALCIUM 9.1 10/08/2021 0337  ? GFRNONAA >60 10/08/2021 0337  ? GFRAA >60 07/04/2019 0953  ? ?Estimated Creatinine Clearance: 57.7 mL/min (by C-G formula based on SCr of 0.94 mg/dL). ? ?COAG ?Lab Results  ?Component Value Date  ? INR 1.1 05/04/2019  ? ? ?Radiology ?CT ABDOMEN PELVIS W CONTRAST ? ?Result Date: 10/06/2021 ?CLINICAL DATA:  LEFT lower quadrant abdominal pain.  Nausea. EXAM: CT ABDOMEN AND PELVIS WITH CONTRAST TECHNIQUE: Multidetector CT imaging of the abdomen and pelvis was performed using the standard protocol following bolus administration of intravenous contrast. RADIATION DOSE REDUCTION: This exam was performed according to the departmental dose-optimization program which includes automated  exposure control, adjustment of the mA and/or kV according to patient size and/or use of iterative reconstruction technique. CONTRAST:  10/10/2021 OMNIPAQUE IOHEXOL 300 MG/ML  SOLN COMPARISON:  CT abdomen dated 07/04/2019 FINDINGS: Lower chest: No acute/significant abnormality. Hepatobiliary: Status post cholecystectomy. No focal liver abnormality is seen. No bile duct dilatation. Pancreas: Unremarkable. No pancreatic ductal dilatation or surrounding inflammatory changes. Spleen: Normal in size without focal abnormality. Adrenals/Urinary Tract: Acute appearing LEFT-sided hydronephrosis, at least moderate in degree, with associated perinephric and periureteral inflammation. 6 mm obstructing stone within the distal LEFT ureter, just proximal to the LEFT UVJ. Additional RIGHT-sided hydronephrosis, also moderate in degree, presumably chronic. No RIGHT-sided ureteral stone. Bladder is decompressed. Stomach/Bowel: No dilated large or small bowel loops. No evidence of focal bowel wall inflammation. Stomach is unremarkable, partially decompressed. Appendix is normal. Scattered diverticulosis throughout the colon but no focal inflammatory change to suggest acute diverticulitis. Vascular/Lymphatic: No acute or significant vascular findings. No enlarged lymph nodes are seen  within the abdomen or pelvis. Reproductive: Presumed hysterectomy.  No adnexal mass or free fluid. Other: No free fluid or abscess collection is seen. No free intraperitoneal air. Musculoskeletal: Degenerative spondylosis at the L4-5 and L5-S1 levels, at least moderate in degree with associated disc space narrowing, vacuum disc phenomenon and osseous spurring. 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 No acute-appearing osseous abnormality. IMPRESSION: 1. 6 mm obstructing stone within the distal LEFT ureter, located just proximal to the LEFT UVJ, resulting in moderate LEFT-sided hydronephrosis and associated perinephric and periureteral  inflammation. 2. Additional RIGHT-sided hydronephrosis, also moderate in degree, presumably chronic. No RIGHT-sided ureteral stone. 3. Colonic diverticulosis without evidence of acute diverticulitis. No bowel obstruc

## 2021-10-23 ENCOUNTER — Ambulatory Visit (INDEPENDENT_AMBULATORY_CARE_PROVIDER_SITE_OTHER): Payer: Medicaid Other

## 2021-10-23 ENCOUNTER — Ambulatory Visit (INDEPENDENT_AMBULATORY_CARE_PROVIDER_SITE_OTHER): Payer: Medicaid Other | Admitting: Vascular Surgery

## 2021-10-23 ENCOUNTER — Encounter (INDEPENDENT_AMBULATORY_CARE_PROVIDER_SITE_OTHER): Payer: Self-pay | Admitting: Vascular Surgery

## 2021-10-23 ENCOUNTER — Other Ambulatory Visit: Payer: Self-pay | Admitting: *Deleted

## 2021-10-23 VITALS — BP 136/83 | HR 79 | Resp 17 | Ht <= 58 in | Wt 147.0 lb

## 2021-10-23 DIAGNOSIS — I10 Essential (primary) hypertension: Secondary | ICD-10-CM

## 2021-10-23 DIAGNOSIS — E119 Type 2 diabetes mellitus without complications: Secondary | ICD-10-CM

## 2021-10-23 DIAGNOSIS — M5126 Other intervertebral disc displacement, lumbar region: Secondary | ICD-10-CM | POA: Diagnosis not present

## 2021-10-23 DIAGNOSIS — I739 Peripheral vascular disease, unspecified: Secondary | ICD-10-CM

## 2021-10-23 MED ORDER — SULFAMETHOXAZOLE-TRIMETHOPRIM 800-160 MG PO TABS: 1.0000 | ORAL_TABLET | Freq: Two times a day (BID) | ORAL | 0 refills | Status: AC

## 2021-10-23 NOTE — Telephone Encounter (Signed)
Notified patient as instructed, patient pleased °

## 2021-10-26 LAB — CULTURE, URINE COMPREHENSIVE

## 2021-10-27 ENCOUNTER — Other Ambulatory Visit: Payer: Self-pay | Admitting: *Deleted

## 2021-10-27 ENCOUNTER — Telehealth: Payer: Self-pay | Admitting: *Deleted

## 2021-10-27 MED ORDER — FLUCONAZOLE 100 MG PO TABS: 100.0000 mg | ORAL_TABLET | Freq: Every day | ORAL | 0 refills | Status: AC

## 2021-10-27 NOTE — Telephone Encounter (Signed)
-----   Message from Riki Altes, MD sent at 10/25/2021 11:17 AM EDT ----- ?Please send rx diflucan 100 mg- 1 tab daily for 5 days ?

## 2021-10-27 NOTE — Telephone Encounter (Signed)
Notified patient as instructed, patient pleased °

## 2021-11-03 ENCOUNTER — Ambulatory Visit (INDEPENDENT_AMBULATORY_CARE_PROVIDER_SITE_OTHER): Payer: Medicaid Other | Admitting: Urology

## 2021-11-03 DIAGNOSIS — R350 Frequency of micturition: Secondary | ICD-10-CM | POA: Diagnosis not present

## 2021-11-03 DIAGNOSIS — N200 Calculus of kidney: Secondary | ICD-10-CM

## 2021-11-03 DIAGNOSIS — R300 Dysuria: Secondary | ICD-10-CM

## 2021-11-03 DIAGNOSIS — R3915 Urgency of urination: Secondary | ICD-10-CM

## 2021-11-03 LAB — URINALYSIS, COMPLETE
Bilirubin, UA: NEGATIVE
Glucose, UA: NEGATIVE
Nitrite, UA: POSITIVE — AB
Specific Gravity, UA: 1.025 (ref 1.005–1.030)
Urobilinogen, Ur: 1 mg/dL (ref 0.2–1.0)
pH, UA: 5.5 (ref 5.0–7.5)

## 2021-11-03 LAB — MICROSCOPIC EXAMINATION
RBC, Urine: >30 /hpf — AB (ref 0–2)
WBC, UA: >30 /hpf — AB (ref 0–5)

## 2021-11-03 MED ORDER — SULFAMETHOXAZOLE-TRIMETHOPRIM 800-160 MG PO TABS
1.0000 | ORAL_TABLET | Freq: Two times a day (BID) | ORAL | 0 refills | Status: DC
Start: 2021-11-03 — End: 2021-11-07

## 2021-11-03 NOTE — Progress Notes (Signed)
Stent removal rescheduled for today.  Urine culture grew Candida and she was treated with fluconazole.  Today complains of frequency, urgency and dysuria.  UA >30 RBC/WBC and is nitrite +.  Urine culture repeated.  Start Septra DS pending culture results.  Reschedule stent removal next week ?

## 2021-11-04 ENCOUNTER — Emergency Department: Payer: Medicaid Other

## 2021-11-04 ENCOUNTER — Inpatient Hospital Stay
Admission: EM | Admit: 2021-11-04 | Discharge: 2021-11-07 | DRG: 690 | Disposition: A | Discharge status: Home / Self Care | Payer: Medicaid Other | Attending: Internal Medicine | Admitting: Internal Medicine

## 2021-11-04 ENCOUNTER — Other Ambulatory Visit: Payer: Self-pay

## 2021-11-04 ENCOUNTER — Encounter: Payer: Self-pay | Admitting: Emergency Medicine

## 2021-11-04 DIAGNOSIS — E869 Volume depletion, unspecified: Secondary | ICD-10-CM | POA: Diagnosis present

## 2021-11-04 DIAGNOSIS — E119 Type 2 diabetes mellitus without complications: Secondary | ICD-10-CM | POA: Diagnosis not present

## 2021-11-04 DIAGNOSIS — E669 Obesity, unspecified: Secondary | ICD-10-CM | POA: Diagnosis present

## 2021-11-04 DIAGNOSIS — E871 Hypo-osmolality and hyponatremia: Secondary | ICD-10-CM | POA: Diagnosis present

## 2021-11-04 DIAGNOSIS — N10 Acute tubulo-interstitial nephritis: Secondary | ICD-10-CM | POA: Diagnosis not present

## 2021-11-04 DIAGNOSIS — Z79899 Other long term (current) drug therapy: Secondary | ICD-10-CM

## 2021-11-04 DIAGNOSIS — R310 Gross hematuria: Secondary | ICD-10-CM | POA: Diagnosis present

## 2021-11-04 DIAGNOSIS — M549 Dorsalgia, unspecified: Secondary | ICD-10-CM | POA: Diagnosis present

## 2021-11-04 DIAGNOSIS — N390 Urinary tract infection, site not specified: Principal | ICD-10-CM

## 2021-11-04 DIAGNOSIS — N12 Tubulo-interstitial nephritis, not specified as acute or chronic: Secondary | ICD-10-CM | POA: Diagnosis present

## 2021-11-04 DIAGNOSIS — Z6832 Body mass index (BMI) 32.0-32.9, adult: Secondary | ICD-10-CM

## 2021-11-04 DIAGNOSIS — I959 Hypotension, unspecified: Secondary | ICD-10-CM | POA: Diagnosis present

## 2021-11-04 DIAGNOSIS — A419 Sepsis, unspecified organism: Secondary | ICD-10-CM | POA: Insufficient documentation

## 2021-11-04 DIAGNOSIS — N179 Acute kidney failure, unspecified: Secondary | ICD-10-CM | POA: Diagnosis not present

## 2021-11-04 DIAGNOSIS — I10 Essential (primary) hypertension: Secondary | ICD-10-CM | POA: Diagnosis present

## 2021-11-04 DIAGNOSIS — Z960 Presence of urogenital implants: Secondary | ICD-10-CM | POA: Diagnosis not present

## 2021-11-04 DIAGNOSIS — Z8249 Family history of ischemic heart disease and other diseases of the circulatory system: Secondary | ICD-10-CM

## 2021-11-04 DIAGNOSIS — Z833 Family history of diabetes mellitus: Secondary | ICD-10-CM

## 2021-11-04 DIAGNOSIS — M329 Systemic lupus erythematosus, unspecified: Secondary | ICD-10-CM | POA: Diagnosis present

## 2021-11-04 DIAGNOSIS — Z8261 Family history of arthritis: Secondary | ICD-10-CM

## 2021-11-04 DIAGNOSIS — Z87442 Personal history of urinary calculi: Secondary | ICD-10-CM

## 2021-11-04 LAB — COMPREHENSIVE METABOLIC PANEL
ALT: 31 U/L (ref 0–44)
AST: 32 U/L (ref 15–41)
Albumin: 3.5 g/dL (ref 3.5–5.0)
Alkaline Phosphatase: 94 U/L (ref 38–126)
Anion gap: 9 (ref 5–15)
BUN: 60 mg/dL — ABNORMAL HIGH (ref 6–20)
CO2: 14 mmol/L — ABNORMAL LOW (ref 22–32)
Calcium: 9.7 mg/dL (ref 8.9–10.3)
Chloride: 109 mmol/L (ref 98–111)
Creatinine, Ser: 1.98 mg/dL — ABNORMAL HIGH (ref 0.44–1.00)
GFR, Estimated: 30 mL/min — ABNORMAL LOW (ref >60)
Glucose, Bld: 141 mg/dL — ABNORMAL HIGH (ref 70–99)
Potassium: 5 mmol/L (ref 3.5–5.1)
Sodium: 132 mmol/L — ABNORMAL LOW (ref 135–145)
Total Bilirubin: 0.5 mg/dL (ref 0.3–1.2)
Total Protein: 9.5 g/dL — ABNORMAL HIGH (ref 6.5–8.1)

## 2021-11-04 LAB — CBC WITH DIFFERENTIAL/PLATELET
Abs Immature Granulocytes: 0.03 10*3/uL (ref 0.00–0.07)
Basophils Absolute: 0 10*3/uL (ref 0.0–0.1)
Basophils Relative: 0 %
Eosinophils Absolute: 0.1 10*3/uL (ref 0.0–0.5)
Eosinophils Relative: 2 %
HCT: 38.5 % (ref 36.0–46.0)
Hemoglobin: 12.4 g/dL (ref 12.0–15.0)
Immature Granulocytes: 1 %
Lymphocytes Relative: 6 %
Lymphs Abs: 0.4 10*3/uL — ABNORMAL LOW (ref 0.7–4.0)
MCH: 30.2 pg (ref 26.0–34.0)
MCHC: 32.2 g/dL (ref 30.0–36.0)
MCV: 93.7 fL (ref 80.0–100.0)
Monocytes Absolute: 0.3 10*3/uL (ref 0.1–1.0)
Monocytes Relative: 5 %
Neutro Abs: 5.3 10*3/uL (ref 1.7–7.7)
Neutrophils Relative %: 86 %
Platelets: 178 10*3/uL (ref 150–400)
RBC: 4.11 MIL/uL (ref 3.87–5.11)
RDW: 12.4 % (ref 11.5–15.5)
WBC: 6.1 10*3/uL (ref 4.0–10.5)
nRBC: 0 % (ref 0.0–0.2)

## 2021-11-04 LAB — URINALYSIS, ROUTINE W REFLEX MICROSCOPIC
Bilirubin Urine: NEGATIVE
Glucose, UA: NEGATIVE mg/dL
Ketones, ur: NEGATIVE mg/dL
Nitrite: NEGATIVE
Protein, ur: 100 mg/dL — AB
RBC / HPF: >50 RBC/hpf — ABNORMAL HIGH (ref 0–5)
Specific Gravity, Urine: 1.018 (ref 1.005–1.030)
Squamous Epithelial / HPF: NONE SEEN (ref 0–5)
WBC, UA: >50 WBC/hpf — ABNORMAL HIGH (ref 0–5)
pH: 5 (ref 5.0–8.0)

## 2021-11-04 LAB — LACTIC ACID, PLASMA
Lactic Acid, Venous: 0.9 mmol/L (ref 0.5–1.9)
Lactic Acid, Venous: 1.5 mmol/L (ref 0.5–1.9)

## 2021-11-04 LAB — LIPASE, BLOOD: Lipase: 72 U/L — ABNORMAL HIGH (ref 11–51)

## 2021-11-04 MED ORDER — MORPHINE SULFATE (PF) 4 MG/ML IV SOLN
4.0000 mg | Freq: Once | INTRAVENOUS | Status: AC
  Administered 2021-11-04: 4 mg via INTRAVENOUS
  Filled 2021-11-04: qty 1

## 2021-11-04 MED ORDER — LACTATED RINGERS IV SOLN: INTRAVENOUS | Status: AC

## 2021-11-04 MED ORDER — ONDANSETRON HCL 4 MG/2ML IJ SOLN
4.0000 mg | INTRAMUSCULAR | Status: AC
  Administered 2021-11-04: 4 mg via INTRAVENOUS
  Filled 2021-11-04: qty 2

## 2021-11-04 MED ORDER — DOCUSATE SODIUM 100 MG PO CAPS
100.0000 mg | ORAL_CAPSULE | Freq: Two times a day (BID) | ORAL | Status: DC
  Administered 2021-11-04 – 2021-11-07 (×7): 100 mg via ORAL
  Filled 2021-11-04 (×7): qty 1

## 2021-11-04 MED ORDER — ONDANSETRON HCL 4 MG PO TABS
4.0000 mg | ORAL_TABLET | Freq: Four times a day (QID) | ORAL | Status: DC | PRN
  Administered 2021-11-06: 4 mg via ORAL
  Filled 2021-11-04: qty 1

## 2021-11-04 MED ORDER — HEPARIN SODIUM (PORCINE) 5000 UNIT/ML IJ SOLN
5000.0000 [IU] | Freq: Three times a day (TID) | INTRAMUSCULAR | Status: DC
  Administered 2021-11-04 – 2021-11-05 (×3): 5000 [IU] via SUBCUTANEOUS
  Filled 2021-11-04 (×3): qty 1

## 2021-11-04 MED ORDER — SODIUM CHLORIDE 0.9 % IV SOLN
1.0000 g | INTRAVENOUS | Status: AC
  Administered 2021-11-04: 1 g via INTRAVENOUS
  Filled 2021-11-04: qty 10

## 2021-11-04 MED ORDER — PANTOPRAZOLE SODIUM 40 MG PO TBEC
40.0000 mg | DELAYED_RELEASE_TABLET | Freq: Every day | ORAL | Status: DC
Start: 2021-11-04 — End: 2021-11-07
  Administered 2021-11-04 – 2021-11-07 (×4): 40 mg via ORAL
  Filled 2021-11-04 (×4): qty 1

## 2021-11-04 MED ORDER — MORPHINE SULFATE (PF) 2 MG/ML IV SOLN: 2.0000 mg | INTRAVENOUS | Status: DC | PRN

## 2021-11-04 MED ORDER — ONDANSETRON HCL 4 MG/2ML IJ SOLN
4.0000 mg | Freq: Four times a day (QID) | INTRAMUSCULAR | Status: DC | PRN
Start: 2021-11-04 — End: 2021-11-07
  Administered 2021-11-04: 4 mg via INTRAVENOUS
  Filled 2021-11-04: qty 2

## 2021-11-04 MED ORDER — HYDROCODONE-ACETAMINOPHEN 5-325 MG PO TABS: 1.0000 | ORAL_TABLET | ORAL | Status: DC | PRN

## 2021-11-04 MED ORDER — ATORVASTATIN CALCIUM 20 MG PO TABS
20.0000 mg | ORAL_TABLET | Freq: Every day | ORAL | Status: DC
  Administered 2021-11-04 – 2021-11-07 (×4): 20 mg via ORAL
  Filled 2021-11-04 (×4): qty 1

## 2021-11-04 MED ORDER — SODIUM CHLORIDE 0.9 % IV BOLUS
500.0000 mL | Freq: Once | INTRAVENOUS | Status: AC
  Administered 2021-11-04: 500 mL via INTRAVENOUS

## 2021-11-04 MED ORDER — ACETAMINOPHEN 650 MG RE SUPP: 650.0000 mg | Freq: Four times a day (QID) | RECTAL | Status: DC | PRN

## 2021-11-04 MED ORDER — ACETAMINOPHEN 325 MG PO TABS: 650.0000 mg | ORAL_TABLET | Freq: Four times a day (QID) | ORAL | Status: DC | PRN

## 2021-11-04 MED ORDER — SODIUM CHLORIDE 0.9 % IV SOLN
2.0000 g | INTRAVENOUS | Status: DC
  Administered 2021-11-05 – 2021-11-06 (×2): 2 g via INTRAVENOUS
  Filled 2021-11-04 (×2): qty 20
  Filled 2021-11-04: qty 2

## 2021-11-04 MED ORDER — HYDRALAZINE HCL 20 MG/ML IJ SOLN: 10.0000 mg | INTRAMUSCULAR | Status: DC | PRN

## 2021-11-04 NOTE — ED Provider Notes (Signed)
? ?[facility] ?Provider Note ? ? ? Event Date/Time  ? First MD Initiated Contact with Patient 11/04/21 1245   ?  (approximate) ? ? ?History  ? ?Post-op Problem ? ?Spanish interpreter  utilized ? ?HPI ? ?Lindsay Dean is a 56 y.o. female   who on review of discharge summary from April of this year had Left ureterolithiasis s/p left ureteral stent placement and stone removal ? ?  ?Patient has been experiencing pain over the left flank now for the last day, fairly severe with nausea.  She reports pain is very much in the spot of her left kidney.  Recently had a kidney stone.  She also is on an antibiotic Bactrim which she started yesterday via her urologist for symptoms of painful urination and burning ? ?Symptoms are not improving and worsening with increasing pain in her left flank.  Feels like pain of a previous kidney stone ? ?She is also had quite a bit of blood in her urine today ? ?No chest pain no fever.  Feels quite nauseated reports severe pain over the left side left flank area ? ?Physical Exam  ? ?Triage Vital Signs: ?ED Triage Vitals  ?Enc Vitals Group  ?   BP 11/04/21 1040 105/73  ?   Pulse Rate 11/04/21 1040 87  ?   Resp 11/04/21 1040 18  ?   Temp 11/04/21 1040 98.6 ?F (37 ?C)  ?   Temp Source 11/04/21 1040 Oral  ?   SpO2 11/04/21 1040 100 %  ?   Weight 11/04/21 1041 150 lb (68 kg)  ?   Height 11/04/21 1041 4\' 9"  (1.448 m)  ?   Head Circumference --   ?   Peak Flow --   ?   Pain Score 11/04/21 1040 9  ?   Pain Loc --   ?   Pain Edu? --   ?   Excl. in [place]? --   ? ? ?Most recent vital signs: ?Vitals:  ? 11/04/21 1413 11/04/21 1443  ?BP: 91/65 97/68  ?Pulse: 70 70  ?Resp: 18   ?Temp:    ?SpO2: 96% 98%  ? ? ? ?General: Awake, appears in notable pain.  Writhing somewhat in the bed complaining of severe left flank pain ?CV:  Good peripheral perfusion.  Normal heart tones.  Regular ?Resp:  Normal effort.  Clear bilateral.  Speaks in clear full sentences ?Abd:  No distention.  Quite  tender to palpation along the left lower quadrant and severely tenderness to percussion of the left costovertebral angle.  No notable pain across the epigastrium or mid to right upper and right lower quadrants. ?Other:  Also appears nauseated ? ? ?ED Results / Procedures / Treatments  ? ?Labs ?(all labs ordered are listed, but only abnormal results are displayed) ?Labs Reviewed  ?COMPREHENSIVE METABOLIC PANEL - Abnormal; Notable for the following components:  ?    Result Value  ? Sodium 132 (*)   ? CO2 14 (*)   ? Glucose, Bld 141 (*)   ? BUN 60 (*)   ? Creatinine, Ser 1.98 (*)   ? Total Protein 9.5 (*)   ? GFR, Estimated 30 (*)   ? All other components within normal limits  ?LIPASE, BLOOD - Abnormal; Notable for the following components:  ? Lipase 72 (*)   ? All other components within normal limits  ?CBC WITH DIFFERENTIAL/PLATELET - Abnormal; Notable for the following components:  ? Lymphs Abs 0.4 (*)   ?  All other components within normal limits  ?URINALYSIS, ROUTINE W REFLEX MICROSCOPIC - Abnormal; Notable for the following components:  ? Color, Urine RED (*)   ? APPearance CLOUDY (*)   ? Hgb urine dipstick LARGE (*)   ? Protein, ur 100 (*)   ? Leukocytes,Ua LARGE (*)   ? RBC / HPF >50 (*)   ? WBC, UA >50 (*)   ? Bacteria, UA MANY (*)   ? All other components within normal limits  ?URINE CULTURE  ?CULTURE, BLOOD (ROUTINE X 2)  ?CULTURE, BLOOD (ROUTINE X 2)  ?POC URINE PREG, ED  ? ? ? ?RADIOLOGY ? ?Personally viewed and interpreted the patient's CT scan, but given her left renal anatomy I am deferring to radiologist for detailed read.  ? ?CT Renal Stone Study ? ?Result Date: 11/04/2021 ?CLINICAL DATA:  Left-sided flank pain. EXAM: CT ABDOMEN AND PELVIS WITHOUT CONTRAST TECHNIQUE: Multidetector CT imaging of the abdomen and pelvis was performed following the standard protocol without IV contrast. RADIATION DOSE REDUCTION: This exam was performed according to the departmental dose-optimization program which includes  automated exposure control, adjustment of the mA and/or kV according to patient size and/or use of iterative reconstruction technique. COMPARISON:  CT abdomen pelvis dated October 06, 2021. FINDINGS: Lower chest: No acute abnormality. Hepatobiliary: No focal liver abnormality is seen. Status post cholecystectomy. No biliary dilatation. Pancreas: Unremarkable. No pancreatic ductal dilatation or surrounding inflammatory changes. Spleen: Normal in size without focal abnormality. Adrenals/Urinary Tract: Adrenal glands are unremarkable. New punctate nonobstructive calculi in the lower pole of the right kidney and upper pole of the left kidney. Resolved right hydroureteronephrosis. New left ureteral stent with resolved left hydroureteronephrosis. Previously seen 6 mm calculus at the left UVJ is no longer identified. The bladder is unremarkable for the degree of distention. Stomach/Bowel: Unchanged small hiatal hernia. Stomach is otherwise within normal limits. Appendix appears normal. No evidence of bowel wall thickening, distention, or inflammatory changes. Diffuse colonic diverticulosis. Vascular/Lymphatic: No significant vascular findings are present. No enlarged abdominal or pelvic lymph nodes. Reproductive: Status post hysterectomy. No adnexal masses. Other: No abdominal wall hernia or abnormality. No abdominopelvic ascites. No pneumoperitoneum. Musculoskeletal: No acute or significant osseous findings. Chronic bilateral L4 pedicle fractures and grade 1 L4-L5 anterolisthesis again noted. IMPRESSION: 1. New left ureteral stent with resolved left hydroureteronephrosis. Previously seen 6 mm calculus at the left UVJ is no longer identified. 2. New punctate nonobstructive bilateral nephrolithiasis. Electronically Signed   By: Obie DredgeWilliam T Derry M.D.   On: 11/04/2021 14:28   ? ? ? ?PROCEDURES: ? ?Critical Care performed: No ? ?Procedures ? ? ?MEDICATIONS ORDERED IN ED: ?Medications  ?lactated ringers infusion (has no  administration in time range)  ?morphine (PF) 4 MG/ML injection 4 mg (4 mg Intravenous Given 11/04/21 1408)  ?ondansetron (ZOFRAN) injection 4 mg (4 mg Intravenous Given 11/04/21 1412)  ?sodium chloride 0.9 % bolus 500 mL (500 mLs Intravenous New Bag/Given 11/04/21 1413)  ?cefTRIAXone (ROCEPHIN) 1 g in sodium chloride 0.9 % 100 mL IVPB (0 g Intravenous Stopped 11/04/21 1448)  ?sodium chloride 0.9 % bolus 500 mL (500 mLs Intravenous New Bag/Given 11/04/21 1448)  ? ? ? ?IMPRESSION / MDM / ASSESSMENT AND PLAN / ED COURSE  ?I reviewed the triage vital signs and the nursing notes. ?             ?               ?Differential diagnosis includes but is not limited to, abdominal  perforation, aortic dissection, cholecystitis, appendicitis, diverticulitis, colitis, esophagitis/gastritis, kidney stone, pyelonephritis, urinary tract infection, aortic aneurysm. All are considered in decision and treatment plan. Based upon the patient's presentation and risk factors, will provide pain relief hydrate, initiate IV antibiotic for concern of urinary tract infection, and plan to consult with urology and obtain CT imaging ? ?Labs reviewed notable for normal white count.  CO2 reduced at 14, creatinine approximately 2.  GFR significantly reduced to 30 ? ?----------------------------------------- ?1:47 PM on 11/04/2021 ?----------------------------------------- ? ? ?The patient is on the cardiac monitor to evaluate for evidence of arrhythmia and/or significant heart rate changes. ? ? ?Discussed with Dr. Bernardo Heater  ? ?----------------------------------------- ?2:37 PM on 11/04/2021 ?----------------------------------------- ?Patient's blood pressure has noted to drop to 91/65 on last check.  We will give additional fluid bolus.  I have discussed case with urology, and Dr. Bernardo Heater has reviewed the CT and knows her well.  Agreeable with receiving Rocephin, IV antibiotics and fluids.  He recommends admission to medical service and will see and  provide consultation on the patient this afternoon. ? ? ?Consulted with hospitalist, discussed with Dr. Louanne Belton, who will admit for further care and treatment and anticipate urology consult as well. ? ?I initiated cod

## 2021-11-04 NOTE — ED Notes (Signed)
EDP Quale at bedside with interpreter.  ?

## 2021-11-04 NOTE — ED Notes (Signed)
EDP Quale aware/notified rocephin had been hung long before cultures ordered.  ?

## 2021-11-04 NOTE — Progress Notes (Signed)
Elink monitoring code sepsis 

## 2021-11-04 NOTE — Consult Note (Signed)
Lindsay Dean is a 56 y.o. female who is status post ureteroscopic removal of a 6 mm left distal ureteral calculus on 10/07/2021.  She was initially scheduled for office stent removal on 10/22/2021 however was complaining of significant pyuria.  Urinalysis with WBCs/RBCs/bacteria/nitrite +.  She was started empirically on Septra DS and it was elected not to remove her stent.  Urine culture subsequently grew Candida and she was treated with a 5-day course of fluconazole. ? ?She was seen again yesterday for stent removal but complaining of persistent irritative voiding symptoms and bladder pain.  Urinalysis again was nitrite + with pyuria/bacteriuria.  Urine culture was repeated and she was started back on Septra and stent removal rescheduled until next week. ? ?She presented to the ED today complaining of left flank pain, nausea, frequency, urgency and dysuria along with gross hematuria.  No fever or chills.  Creatinine increased 1.9 (baseline 0.9).  Stone protocol CT was performed which showed no hydronephrosis and stent in good position.  She has been started on IV antibiotics. ? ?Impression: ?Left flank pain.  CT shows no hydronephrosis and stent in good position.  No indication at this time for stent exchange.  Continue IV antibiotics pending urine culture. ?AKI most likely secondary to volume depletion ?Will plan on office stent removal next week as scheduled ? ?John Giovanni, MD ?

## 2021-11-04 NOTE — ED Triage Notes (Signed)
Pt here with post op complications. Pt had surgery 2 weeks ago and was given abx but then began to have left side flank pain. Pt states she has a kidney stone currently. Pt endorses lower abd and left side pain and is having vaginal bleeding as well. ?

## 2021-11-04 NOTE — ED Notes (Signed)
Patient transported to CT 

## 2021-11-04 NOTE — Progress Notes (Signed)
Notified bedside nurse of need to draw lactic acid and repeat lactic acid. 

## 2021-11-04 NOTE — Hospital Course (Addendum)
Patient is a 56 years old female with past medical history of ureterolithiasis status post stent placement on previous admission by urology presented to the hospital with dysuria, severe flank pain.  Patient stated that she had been having increasing urgency frequency and dysuria.  Used video interpreter for the patient.  Patient saw urology yesterday and urine culture was sent in.  Patient was started on Bactrim for the same but due to persistent of symptoms patient came to the hospital.  Patient denies any vomiting but had nausea at home.  She did have chills but no overt fever.  Denies any chest pain, shortness of breath, cough or dyspnea.  Denies any dizziness lightheadedness or syncope.  Denies any diarrhea or constipation ? ?In the ED, patient had mildly low blood pressure but not tachycardia.  No fever.  Labs showed WBC at 6.1.  Sodium of 132 with creatinine elevated at 1.9 from baseline 0.9.  LFTs within normal limits.  Lipase 72.  Urinalysis showed red urine with large hemoglobin and protein with leukocytes and white cells more than 50.  Blood culture and urine culture was sent from the ED.  Patient received Rocephin 1 g IV, IV fluid bolus 1 L, morphine IV and was considered for admission to the hospital for further evaluation and treatment.  Urology Dr Virl Diamond was notified from the ED who will be consulting the patient while in the hospital. ? ?Assessment and plan. ?Principal Problem: ?  UTI (urinary tract infection) ?Active Problems: ?  Back pain ?  Hypertension ?  Lupus (HCC) ?  Diabetes mellitus without complication (HCC) ?  Hyponatremia ?  AKI (acute kidney injury) (HCC) ?  ?Acute UTI with flank pain.  Possibility of acute pyelonephritis.  Patient was treated with antibiotics as outpatient.  No fever or leukocytosis at this time but will follow blood cultures, urine cultures.  Continue with IV Rocephin analgesia antiemetics and symptomatic care.  Patient had undergone removal of kidney stone on previous  admission with ureteric stent.  CT scan this time shows no evidence of hydronephrosis.  Follow urology recommendations.  Check lactate. ? ?Acute kidney injury.  Admission creatinine at 1.9 today from previous creatinine at 0.9 on 10/08/2021.  Could be secondary to volume depletion/use of HCTZ Advil losartan and Bactrim. we will continue with IV fluid hydration.  Patient received 1 L of IV fluid in the ED.  Continue to monitor intake and output charting.  We will continue to hold HCTZ, Advil, losartan and Bactrim from home ? ?Mild hyponatremia.  Likely secondary to volume depletion.  We will continue to replenish with normal saline.  Check BMP in AM. ? ?History of hypertension ?Was mildly hypotensive in the ED.  Required IV fluids.  We will hold off with atenolol HCTZ from home.  Patient was also on Advil and losartan at home including Bactrim. ? ?Diabetes mellitus type 2 without complication.  Latest hemoglobin A1c of 5.9.  Continue sliding scale insulin and glucose monitoring while in the hospital.  Diabetic diet. ? ?History of lupus.  Off Imuran for 1 year as per rheumatology.  Follows up with rheumatology as outpatient. ? ? ? ?

## 2021-11-04 NOTE — H&P (Signed)
?[facility] ?History and Physical ? ?Lindsay Dean AOZ:[id number] DOB: Jul 13, 1965 DOA: 11/04/2021 ? ?Referring physician: ED ? ?PCP: Preston Fleetingevelo, Adrian Mancheno, MD  ? ?Patient is coming from: home  ? ?Language barrier.  History obtained with the help of video interpreter. ? ?Chief Complaint: loin pain left  ? ?HPI:  Patient is a 56 years old female with past medical history of ureterolithiasis status post stent placement on previous admission by urology presented to the hospital with dysuria, severe flank pain.  Patient stated that she had been having increasing urgency frequency and dysuria.  Used video interpreter for the patient.  Patient saw urology yesterday and urine culture was sent in.  Patient was started on Bactrim for the same but due to persistent of symptoms patient came to the hospital.  Patient denies any vomiting but had nausea at home.  She did have chills but no overt fever.  Denies any chest pain, shortness of breath, cough or dyspnea.  Denies any dizziness lightheadedness or syncope.  Denies any diarrhea or constipation ? ?In the ED, patient had mildly low blood pressure but not tachycardia.  No fever.  Labs showed WBC at 6.1.  Sodium of 132 with creatinine elevated at 1.9 from baseline 0.9.  LFTs within normal limits.  Lipase 72.  Urinalysis showed red urine with large hemoglobin and protein with leukocytes and white cells more than 50.  Blood culture and urine culture was sent from the ED.  Patient received Rocephin 1 g IV, IV fluid bolus 1 L, morphine IV and was considered for admission to the hospital for further evaluation and treatment.  Urology Dr Virl DiamondStoiff was notified from the ED who will be consulting the patient while in the hospital. ? ?Assessment and plan. ?Principal Problem: ?  UTI (urinary tract infection) ?Active Problems: ?  Back pain ?  Hypertension ?  Lupus (HCC) ?  Diabetes mellitus without complication (HCC) ?  Hyponatremia ?  AKI (acute kidney injury) (HCC) ?  ?Acute  UTI with flank pain.  Possibility of acute pyelonephritis.  Patient was treated with antibiotics as outpatient.  No fever or leukocytosis at this time but will follow blood cultures, urine cultures.  Continue with IV Rocephin analgesia antiemetics and symptomatic care.  Patient had undergone removal of kidney stone on previous admission with ureteric stent.  CT scan this time shows no evidence of hydronephrosis.  Follow urology recommendations.  Check lactate. ? ?Acute kidney injury.  Admission creatinine at 1.9 today from previous creatinine at 0.9 on 10/08/2021.  Could be secondary to volume depletion/use of HCTZ Advil losartan and Bactrim. we will continue with IV fluid hydration.  Patient received 1 L of IV fluid in the ED.  Continue to monitor intake and output charting.  We will continue to hold HCTZ, Advil, losartan and Bactrim from home ? ?Mild hyponatremia.  Likely secondary to volume depletion.  We will continue to replenish with normal saline.  Check BMP in AM. ? ?History of hypertension ?Was mildly hypotensive in the ED.  Required IV fluids.  We will hold off with atenolol HCTZ from home.  Patient was also on Advil and losartan at home including Bactrim. ? ?Diabetes mellitus type 2 without complication.  Latest hemoglobin A1c of 5.9.  Continue sliding scale insulin and glucose monitoring while in the hospital.  Diabetic diet. ? ?History of lupus.  Off Imuran for 1 year as per rheumatology.  Follows up with rheumatology as outpatient. ? ? ? ?DVT Prophylaxis:heparin  suq ? ?Review  of Systems:  ?All systems were reviewed and were negative unless otherwise mentioned in the HPI ? ? ?Past Medical History:  ?Diagnosis Date  ? Diabetes mellitus without complication (HCC)   ? taken off meds in 2017  ? Hypertension   ? Lupus (HCC)   ? ?Past Surgical History:  ?Procedure Laterality Date  ? ABDOMINAL HYSTERECTOMY    ? CYSTOSCOPY W/ RETROGRADES Left 10/07/2021  ? Procedure: CYSTOSCOPY WITH RETROGRADE PYELOGRAM;   Surgeon: Riki Altes, MD;  Location: [facility];  Service: Urology;  Laterality: Left;  ? CYSTOSCOPY WITH STENT PLACEMENT Right 05/04/2019  ? Procedure: CYSTOSCOPY with attempt of stent placement;  Surgeon: Sondra Come, MD;  Location: [facility];  Service: Urology;  Laterality: Right;  ? CYSTOSCOPY/URETEROSCOPY/HOLMIUM LASER/STENT PLACEMENT Left 10/07/2021  ? Procedure: CYSTOSCOPY/URETEROSCOPY/HOLMIUM LASER/STENT PLACEMENT;  Surgeon: Riki Altes, MD;  Location: [facility];  Service: Urology;  Laterality: Left;  ? ? ?Social History:  reports that she has never smoked. She has never used smokeless tobacco. She reports that she does not drink alcohol and does not use drugs. ? ?Allergies  ?Allergen Reactions  ? Tramadol Nausea Only  ?  Other reaction(s): Dizziness  ? ? ?Family History  ?Problem Relation Age of Onset  ? Diabetes Mother   ? Heart disease Mother   ? Hypertension Mother   ? Arthritis Mother   ? Hypertension Father   ? Lung disease Father   ?  ? ?Prior to Admission medications   ?Medication Sig Start Date End Date Taking? Authorizing Provider  ?atenolol (TENORMIN) 50 MG tablet Take 50 mg by mouth daily.    [provider]  ?atorvastatin (LIPITOR) 20 MG tablet Take 20 mg by mouth daily.    [provider]  ?hydrochlorothiazide (HYDRODIURIL) 25 MG tablet Take 25 mg by mouth daily. 08/27/21   [provider]  ?ibuprofen (ADVIL) 800 MG tablet Take 1 tablet (800 mg total) by mouth daily as needed. 10/08/21   Enedina Finner, MD  ?losartan (COZAAR) 100 MG tablet Take 100 mg by mouth daily. 07/25/21   [provider]  ?omeprazole (PRILOSEC) 20 MG capsule Take 20 mg by mouth daily. 07/25/21   [provider]  ?sulfamethoxazole-trimethoprim (BACTRIM DS) 800-160 MG tablet Take 1 tablet by mouth every 12 (twelve) hours. 11/03/21   Stoioff, Verna Czech, MD  ? ? ?Physical Exam: ?Vitals:  ? 11/04/21 1040 11/04/21 1041 11/04/21 1413 11/04/21 1443  ?BP: 105/73  91/65 97/68  ?Pulse: 87   70 70  ?Resp: 18  18   ?Temp: 98.6 ?F (37 ?C)     ?TempSrc: Oral     ?SpO2: 100%  96% 98%  ?Weight:  68 kg    ?Height:  4\' 9"  (1.448 m)    ? ?Wt Readings from Last 3 Encounters:  ?11/04/21 68 kg  ?10/23/21 66.7 kg  ?10/22/21 67.1 kg  ? ?Body mass index is 32.46 kg/m?. ? ?General:  obese built, not in obvious distress ?HENT: Normocephalic, pupils equally reacting to light and accommodation.  No scleral pallor or icterus noted. Oral mucosa is dry ?Chest:  Clear breath sounds.  Diminished breath sounds bilaterally. No crackles or wheezes.  ?CVS: S1 &S2 heard. No murmur.  Regular rate and rhythm. ?Abdomen: Soft, tenderness over the left  flank,bowel sounds are heard.  Liver is not palpable, no abdominal mass palpated ?Extremities: No cyanosis, clubbing or edema.  Peripheral pulses are palpable. ?Psych: Alert, awake and oriented, normal mood ?CNS:  No cranial nerve deficits.  Power equal in all extremities.  ?Skin: Warm and dry.  No rashes noted. ? ?Labs on Admission:  ? ?CBC: ?Recent Labs  ?Lab 11/04/21 ?1042  ?WBC 6.1  ?NEUTROABS 5.3  ?HGB 12.4  ?HCT 38.5  ?MCV 93.7  ?PLT 178  ? ? ?Basic Metabolic Panel: ?Recent Labs  ?Lab 11/04/21 ?1042  ?NA 132*  ?K 5.0  ?CL 109  ?CO2 14*  ?GLUCOSE 141*  ?BUN 60*  ?CREATININE 1.98*  ?CALCIUM 9.7  ? ? ?Liver Function Tests: ?Recent Labs  ?Lab 11/04/21 ?1042  ?AST 32  ?ALT 31  ?ALKPHOS 94  ?BILITOT 0.5  ?PROT 9.5*  ?ALBUMIN 3.5  ? ?Recent Labs  ?Lab 11/04/21 ?1042  ?LIPASE 72*  ? ?No results for input(s): AMMONIA in the last 168 hours. ? ?Cardiac Enzymes: ?No results for input(s): CKTOTAL, CKMB, CKMBINDEX, TROPONINI in the last 168 hours. ? ?BNP (last 3 results) ?No results for input(s): BNP in the last 8760 hours. ? ?ProBNP (last 3 results) ?No results for input(s): PROBNP in the last 8760 hours. ? ?CBG: ?No results for input(s): GLUCAP in the last 168 hours. ? ?Lipase  ?   ?Component Value Date/Time  ? LIPASE 72 (H) 11/04/2021 1042  ?  ? ?Urinalysis ?   ?Component Value Date/Time  ?  COLORURINE RED (A) 11/04/2021 1052  ? APPEARANCEUR CLOUDY (A) 11/04/2021 1052  ? APPEARANCEUR Cloudy (A) 11/03/2021 0848  ? LABSPEC 1.018 11/04/2021 1052  ? PHURINE 5.0 11/04/2021 1052  ? GLUCOSEU NEGATIVE 0

## 2021-11-04 NOTE — Progress Notes (Signed)
Notified provider of need to order lactic acid and repeat lactic acid.  

## 2021-11-04 NOTE — ED Notes (Signed)
Lactic tube sent on ice with blood cultures.  ?

## 2021-11-04 NOTE — Consult Note (Signed)
CODE SEPSIS - PHARMACY COMMUNICATION ? ?**Broad Spectrum Antibiotics should be administered within 1 hour of Sepsis diagnosis** ? ?Time Code Sepsis Called/Page Received: 1444 ? ?Antibiotics Ordered: rocephin ? ?Time of 1st antibiotic administration: 1406 ? ?Additional action taken by pharmacy: none ? ?If necessary, Name of Provider/Nurse Contacted: n/a ? ? ? ?Bettey Costa ,PharmD ?Clinical Pharmacist  ?11/04/2021  2:41 PM ? ?

## 2021-11-04 NOTE — ED Notes (Signed)
See triage note  presents with some abd pain and blood in urine  states she has a kidney stone   pain is mainly on the left ?

## 2021-11-05 DIAGNOSIS — E869 Volume depletion, unspecified: Secondary | ICD-10-CM | POA: Diagnosis present

## 2021-11-05 DIAGNOSIS — Z8249 Family history of ischemic heart disease and other diseases of the circulatory system: Secondary | ICD-10-CM | POA: Diagnosis not present

## 2021-11-05 DIAGNOSIS — E871 Hypo-osmolality and hyponatremia: Secondary | ICD-10-CM | POA: Diagnosis present

## 2021-11-05 DIAGNOSIS — Z833 Family history of diabetes mellitus: Secondary | ICD-10-CM | POA: Diagnosis not present

## 2021-11-05 DIAGNOSIS — N390 Urinary tract infection, site not specified: Secondary | ICD-10-CM | POA: Diagnosis present

## 2021-11-05 DIAGNOSIS — Z960 Presence of urogenital implants: Secondary | ICD-10-CM | POA: Diagnosis not present

## 2021-11-05 DIAGNOSIS — I10 Essential (primary) hypertension: Secondary | ICD-10-CM | POA: Diagnosis present

## 2021-11-05 DIAGNOSIS — R310 Gross hematuria: Secondary | ICD-10-CM | POA: Diagnosis present

## 2021-11-05 DIAGNOSIS — Z87442 Personal history of urinary calculi: Secondary | ICD-10-CM | POA: Diagnosis not present

## 2021-11-05 DIAGNOSIS — E669 Obesity, unspecified: Secondary | ICD-10-CM | POA: Diagnosis present

## 2021-11-05 DIAGNOSIS — Z8261 Family history of arthritis: Secondary | ICD-10-CM | POA: Diagnosis not present

## 2021-11-05 DIAGNOSIS — N10 Acute tubulo-interstitial nephritis: Secondary | ICD-10-CM | POA: Diagnosis present

## 2021-11-05 DIAGNOSIS — Z6832 Body mass index (BMI) 32.0-32.9, adult: Secondary | ICD-10-CM | POA: Diagnosis not present

## 2021-11-05 DIAGNOSIS — M329 Systemic lupus erythematosus, unspecified: Secondary | ICD-10-CM | POA: Diagnosis present

## 2021-11-05 DIAGNOSIS — I959 Hypotension, unspecified: Secondary | ICD-10-CM | POA: Diagnosis present

## 2021-11-05 DIAGNOSIS — E119 Type 2 diabetes mellitus without complications: Secondary | ICD-10-CM | POA: Diagnosis present

## 2021-11-05 DIAGNOSIS — Z79899 Other long term (current) drug therapy: Secondary | ICD-10-CM | POA: Diagnosis not present

## 2021-11-05 DIAGNOSIS — N12 Tubulo-interstitial nephritis, not specified as acute or chronic: Secondary | ICD-10-CM | POA: Diagnosis present

## 2021-11-05 DIAGNOSIS — N179 Acute kidney failure, unspecified: Secondary | ICD-10-CM | POA: Diagnosis present

## 2021-11-05 LAB — CBC
HCT: 29 % — ABNORMAL LOW (ref 36.0–46.0)
Hemoglobin: 9.6 g/dL — ABNORMAL LOW (ref 12.0–15.0)
MCH: 31 pg (ref 26.0–34.0)
MCHC: 33.1 g/dL (ref 30.0–36.0)
MCV: 93.5 fL (ref 80.0–100.0)
Platelets: 118 10*3/uL — ABNORMAL LOW (ref 150–400)
RBC: 3.1 MIL/uL — ABNORMAL LOW (ref 3.87–5.11)
RDW: 12.5 % (ref 11.5–15.5)
WBC: 2.5 10*3/uL — ABNORMAL LOW (ref 4.0–10.5)
nRBC: 0 % (ref 0.0–0.2)

## 2021-11-05 LAB — COMPREHENSIVE METABOLIC PANEL
ALT: 72 U/L — ABNORMAL HIGH (ref 0–44)
AST: 84 U/L — ABNORMAL HIGH (ref 15–41)
Albumin: 2.8 g/dL — ABNORMAL LOW (ref 3.5–5.0)
Alkaline Phosphatase: 100 U/L (ref 38–126)
Anion gap: 7 (ref 5–15)
BUN: 45 mg/dL — ABNORMAL HIGH (ref 6–20)
CO2: 16 mmol/L — ABNORMAL LOW (ref 22–32)
Calcium: 8.5 mg/dL — ABNORMAL LOW (ref 8.9–10.3)
Chloride: 111 mmol/L (ref 98–111)
Creatinine, Ser: 1.58 mg/dL — ABNORMAL HIGH (ref 0.44–1.00)
GFR, Estimated: 39 mL/min — ABNORMAL LOW (ref >60)
Glucose, Bld: 86 mg/dL (ref 70–99)
Potassium: 4.5 mmol/L (ref 3.5–5.1)
Sodium: 134 mmol/L — ABNORMAL LOW (ref 135–145)
Total Bilirubin: 0.2 mg/dL — ABNORMAL LOW (ref 0.3–1.2)
Total Protein: 7.5 g/dL (ref 6.5–8.1)

## 2021-11-05 LAB — URINE CULTURE: Culture: NO GROWTH

## 2021-11-05 LAB — MAGNESIUM: Magnesium: 1.9 mg/dL (ref 1.7–2.4)

## 2021-11-05 LAB — PHOSPHORUS: Phosphorus: 3.7 mg/dL (ref 2.5–4.6)

## 2021-11-05 MED ORDER — LACTATED RINGERS IV SOLN: INTRAVENOUS | Status: DC

## 2021-11-05 MED ORDER — SODIUM CHLORIDE 0.9 % IV SOLN: INTRAVENOUS | Status: DC

## 2021-11-05 NOTE — Progress Notes (Signed)
?PROGRESS NOTE ? ? ? ?Lindsay Dean  XBM:[id number] DOB: Apr 08, 1966 DOA: 11/04/2021 ?PCP: Preston Fleeting, MD  ? ? ?Brief Narrative:  ?56 years old female with past medical history of ureterolithiasis status post stent placement on previous admission by urology presented to the hospital with dysuria, severe flank pain.  Patient stated that she had been having increasing urgency frequency and dysuria.  Used video interpreter for the patient.  Patient saw urology yesterday and urine culture was sent in.  Patient was started on Bactrim for the same but due to persistent of symptoms patient came to the hospital.  Patient denies any vomiting but had nausea at home.  She did have chills but no overt fever.  Denies any chest pain, shortness of breath, cough or dyspnea.  Denies any dizziness lightheadedness or syncope.  Denies any diarrhea or constipation ?  ?In the ED, patient had mildly low blood pressure but not tachycardia.  No fever.  Labs showed WBC at 6.1.  Sodium of 132 with creatinine elevated at 1.9 from baseline 0.9.  LFTs within normal limits.  Lipase 72.  Urinalysis showed red urine with large hemoglobin and protein with leukocytes and white cells more than 50.  Blood culture and urine culture was sent from the ED.  Patient received Rocephin 1 g IV, IV fluid bolus 1 L, morphine IV and was considered for admission to the hospital for further evaluation and treatment.  Urology Dr Virl Diamond was notified from the ED who will be consulting the patient while in the hospital. ? ?Seen in consultation by urology.  CT reviewed.  No hydronephrosis.  Stent in good position.  No indication for stent exchange at this time` ? ? ?Assessment & Plan: ?  ?Principal Problem: ?  UTI (urinary tract infection) ?Active Problems: ?  Back pain ?  Hypertension ?  Lupus (HCC) ?  Diabetes mellitus without complication (HCC) ?  Hyponatremia ?  AKI (acute kidney injury) (HCC) ? ?Acute UTI with flank pain.  Possibility of acute  pyelonephritis.   ?Patient was treated with antibiotics as outpatient, likely impacting current culture ?No fever or leukocytosis at this time  ?Still with flank pain ?Patient reports small blood in urine ?Plan: ?Continue IV Rocephin ?Follow urinalysis and urine culture for pathogen identification ?Continue intravenous fluids ?Daily renal function ?Monitor vitals and fever curve ?Anticipate discharge 5/18 ?  ?Acute kidney injury.   ?Admission creatinine at 1.9 today from previous creatinine at 0.9 on 10/08/2021.   ?Could be secondary to volume depletion/use of HCTZ Advil losartan and Bactrim.  ?Kidney function improving but not at baseline ?Plan: ?Continue maintenance IVF ?Monitor UOP ?Hold hydrochlorothiazide, losartan ?Avoid NSAIDs ?Daily renal function ?Tentative plan discharge home 5/18 if kidney function improved ? ?  ?Mild hyponatremia.  Resolved  ?likely secondary to volume depletion.   ?Daily BMP ?  ?History of hypertension ?Was mildly hypotensive in the ED.  Required IV fluids.   ?Hold hydrochlorothiazide, losartan ?Currently holding atenolol, restart as appropriate ?  ?Diabetes mellitus type 2 without complication ?Latest hemoglobin A1c of 5.9.   ?Continue sliding scale insulin and glucose monitoring while in the hospital.   ?Diabetic diet. ?  ?History of lupus ?Off Imuran for 1 year as per rheumatology ?Follows up with rheumatology as outpatient ? ? ?DVT prophylaxis: SCDs.  Chemoprophylaxis currently on hold ?Code Status: Full ?Family Communication:None today.  Offered to call but patient declined ?Disposition Plan: Status is: Observation ?The patient will require care spanning > 2 midnights and should  be moved to inpatient because: UTI/pyelonephritis on IV antibiotics.  Pending culture data.  Anticipate discharge 5/18 ? ? ?Level of care: Med-Surg ? ?Consultants:  ?Urology ? ?Procedures:  ?None ? ?Antimicrobials: ?Ceftriaxone ? ? ?Subjective: ?Seen and examined.  History conducted in [place].  No visible  distress.  Does endorse persistent left flank pain though improving over interval.  Endorses small-volume hematuria ? ?Objective: ?Vitals:  ? 11/04/21 2044 11/05/21 0500 11/05/21 0515 11/05/21 0806  ?BP: 90/62  97/68 109/73  ?Pulse: 65  65 64  ?Resp: 20  16 18   ?Temp: 98.7 ?F (37.1 ?C)  98 ?F (36.7 ?C) 97.9 ?F (36.6 ?C)  ?TempSrc:   Oral Oral  ?SpO2: 100%  99% 100%  ?Weight:  68.6 kg    ?Height:      ? ? ?Intake/Output Summary (Last 24 hours) at 11/05/2021 1114 ?Last data filed at 11/05/2021 1021 ?Gross per 24 hour  ?Intake 1447.76 ml  ?Output --  ?Net 1447.76 ml  ? ?Filed Weights  ? 11/04/21 1041 11/05/21 0500  ?Weight: 68 kg 68.6 kg  ? ? ?Examination: ? ?General exam: No acute distress ?Respiratory system: Clear to auscultation. Respiratory effort normal. ?Cardiovascular system: S1-S2, RRR, no murmurs, no pedal edema ?Gastrointestinal system: Soft, nondistended, tender to palpation left flank, normal bowel sounds ?Central nervous system: Alert and oriented. No focal neurological deficits. ?Extremities: Symmetric 5 x 5 power. ?Skin: No rashes, lesions or ulcers ?Psychiatry: Judgement and insight appear normal. Mood & affect appropriate.  ? ? ? ?Data Reviewed: I have personally reviewed following labs and imaging studies ? ?CBC: ?Recent Labs  ?Lab 11/04/21 ?1042 11/05/21 ?0540  ?WBC 6.1 2.5*  ?NEUTROABS 5.3  --   ?HGB 12.4 9.6*  ?HCT 38.5 29.0*  ?MCV 93.7 93.5  ?PLT 178 118*  ? ?Basic Metabolic Panel: ?Recent Labs  ?Lab 11/04/21 ?1042 11/05/21 ?0540  ?NA 132* 134*  ?K 5.0 4.5  ?CL 109 111  ?CO2 14* 16*  ?GLUCOSE 141* 86  ?BUN 60* 45*  ?CREATININE 1.98* 1.58*  ?CALCIUM 9.7 8.5*  ?MG  --  1.9  ?PHOS  --  3.7  ? ?GFR: ?Estimated Creatinine Clearance: 32.9 mL/min (A) (by C-G formula based on SCr of 1.58 mg/dL (H)). ?Liver Function Tests: ?Recent Labs  ?Lab 11/04/21 ?1042 11/05/21 ?0540  ?AST 32 84*  ?ALT 31 72*  ?ALKPHOS 94 100  ?BILITOT 0.5 0.2*  ?PROT 9.5* 7.5  ?ALBUMIN 3.5 2.8*  ? ?Recent Labs  ?Lab 11/04/21 ?1042   ?LIPASE 72*  ? ?No results for input(s): AMMONIA in the last 168 hours. ?Coagulation Profile: ?No results for input(s): INR, PROTIME in the last 168 hours. ?Cardiac Enzymes: ?No results for input(s): CKTOTAL, CKMB, CKMBINDEX, TROPONINI in the last 168 hours. ?BNP (last 3 results) ?No results for input(s): PROBNP in the last 8760 hours. ?HbA1C: ?No results for input(s): HGBA1C in the last 72 hours. ?CBG: ?No results for input(s): GLUCAP in the last 168 hours. ?Lipid Profile: ?No results for input(s): CHOL, HDL, LDLCALC, TRIG, CHOLHDL, LDLDIRECT in the last 72 hours. ?Thyroid Function Tests: ?No results for input(s): TSH, T4TOTAL, FREET4, T3FREE, THYROIDAB in the last 72 hours. ?Anemia Panel: ?No results for input(s): VITAMINB12, FOLATE, FERRITIN, TIBC, IRON, RETICCTPCT in the last 72 hours. ?Sepsis Labs: ?Recent Labs  ?Lab 11/04/21 ?1512 11/04/21 ?1735  ?LATICACIDVEN 0.9 1.5  ? ? ?Recent Results (from the past 240 hour(s))  ?Microscopic Examination     Status: Abnormal  ? Collection Time: 11/03/21  8:48 AM  ? Urine  ?  Result Value Ref Range Status  ? WBC, UA >30 (A) 0 - 5 /hpf Final  ? RBC >30 (A) 0 - 2 /hpf Final  ? Epithelial Cells (non renal) 0-10 0 - 10 /hpf Final  ? Bacteria, UA Moderate (A) None seen/Few Final  ?CULTURE, URINE COMPREHENSIVE     Status: None (Preliminary result)  ? Collection Time: 11/03/21 10:00 AM  ? Specimen: Urine  ? UR  ?Result Value Ref Range Status  ? Urine Culture, Comprehensive Preliminary report  Preliminary  ? Organism ID, Bacteria Comment  Preliminary  ?  Comment: Mixed urogenital flora ?10,000-25,000 colony forming units per mL ?  ?Blood culture (routine x 2)     Status: None (Preliminary result)  ? Collection Time: 11/04/21  6:16 PM  ? Specimen: BLOOD  ?Result Value Ref Range Status  ? Specimen Description BLOOD BLOOD LEFT ARM  Final  ? Special Requests   Final  ?  BOTTLES DRAWN AEROBIC AND ANAEROBIC Blood Culture adequate volume  ? Culture   Final  ?  NO GROWTH < 24  HOURS ?Performed at [facility], [address], [place], [place] [address] ?  ? Report Status PENDING  Incomplete  ?Blood culture (routine x 2)     Status: None (Preliminary result)  ? Collection Time: 11/04/21

## 2021-11-05 NOTE — Progress Notes (Signed)
Nutrition Brief Note ? ?Patient identified on the Malnutrition Screening Tool (MST) Report ? ?Wt Readings from Last 15 Encounters:  ?11/05/21 68.6 kg  ?10/23/21 66.7 kg  ?10/22/21 67.1 kg  ?10/07/21 68 kg  ?10/28/20 67 kg  ?05/20/20 68 kg  ?07/04/19 66.7 kg  ?05/04/19 71.2 kg  ?05/23/18 65.8 kg  ?05/13/18 66.2 kg  ? ?Pt with past medical history of ureterolithiasis status post stent placement presented to the hospital with dysuria, severe flank pain. ? ?Pt admitted with acute UTI with flank pain and possible acute pyelonephritis.   ? ?Reviewed I/O's: +1.2 L x 24 hours  ? ?Spoke with pt at bedside with assistance of [employer] Interpreter Jenny Reichmann [phone]). Pt reports that she currently has a good appetite, however, was unable to keep foods and liquids down for 2-3 days PTA eating "literally nothing". Prior to acute illness, pt with good appetite, consuming 2-3 meals per day (Breakfast: eggs, toast, oatmeal, and juice; Lunch: soup). Pt admits to drinking a lot of fruit juices.  ? ?Pt reports her UBW is around 145#. She denies any weight loss. Reviewed wt hx; wt has been stable over the past 6 months.  ? ?Nutrition-Focused physical exam completed. Findings are no fat depletion, no muscle depletion, and no edema.   ? ?Discussed importance of good meal intake to promote healing.  ? ?Per chart review, pt with history of DM, but in remission (pt was taken off DM medications in 20217). RD will liberalize diet to 2 gram sodium for wider variety of meal selections.  ? ?Medications reviewed and include colace.  ? ?Labs reviewed: Na: 134, CBGS: 170 (inpatient orders for glycemic control are none).   ? ?Body mass index is 32.74 kg/m?Marland Kitchen Patient meets criteria for obesity, class I based on current BMI. Obesity is a complex, chronic medical condition that is optimally managed by a multidisciplinary care team. Weight loss is not an ideal goal for an acute inpatient hospitalization. However, if further work-up for obesity is warranted,  consider outpatient referral to outpatient bariatric service and/or [facility].   ? ?Current diet order is heart healthy/ carb modified, patient is consuming approximately 100% of meals at this time. Labs and medications reviewed.  ? ?No nutrition interventions warranted at this time. If nutrition issues arise, please consult RD.  ? ?Loistine Chance, RD, LDN, CDCES ?Registered Dietitian II ?Certified Diabetes Care and Education Specialist ?Please refer to [facility] for RD and/or RD on-call/weekend/after hours pager   ?

## 2021-11-05 NOTE — Progress Notes (Signed)
VAST consult received to start a 2nd PIV access for administration of Rocephin which is incompatible with current fluids (LR). Nestor Ramp, MD via SecureChat and requested fluids be changed to NS so that patient would not have to get a 2nd PIV. Dr. Georgeann Oppenheim was in agreement with changing fluids to NS. Unit RN notified. ?

## 2021-11-05 NOTE — Progress Notes (Signed)
Mobility Specialist - Progress Note ? ? 11/05/21 1200  ?Mobility  ?Activity Ambulated independently in hallway;Stood at bedside  ?Level of Assistance Independent  ?Assistive Device None  ?Distance Ambulated (ft) 320 ft  ?Activity Response Tolerated well  ?$Mobility charge 1 Mobility  ? ? ? ?Pt standing in bathroom upon arrival using RA. Completes 2 laps around NS indep voicing no complaints and returns EOB with needs in reach. ? ?Lindsay Dean ?Mobility Specialist ?11/05/21, 12:20 PM ? ? ? ? ?

## 2021-11-06 ENCOUNTER — Inpatient Hospital Stay: Payer: Medicaid Other

## 2021-11-06 LAB — CBC WITH DIFFERENTIAL/PLATELET
Abs Immature Granulocytes: 0.01 10*3/uL (ref 0.00–0.07)
Basophils Absolute: 0 10*3/uL (ref 0.0–0.1)
Basophils Relative: 0 %
Eosinophils Absolute: 0.1 10*3/uL (ref 0.0–0.5)
Eosinophils Relative: 3 %
HCT: 28.1 % — ABNORMAL LOW (ref 36.0–46.0)
Hemoglobin: 9.1 g/dL — ABNORMAL LOW (ref 12.0–15.0)
Immature Granulocytes: 0 %
Lymphocytes Relative: 51 %
Lymphs Abs: 1.3 10*3/uL (ref 0.7–4.0)
MCH: 29.9 pg (ref 26.0–34.0)
MCHC: 32.4 g/dL (ref 30.0–36.0)
MCV: 92.4 fL (ref 80.0–100.0)
Monocytes Absolute: 0.4 10*3/uL (ref 0.1–1.0)
Monocytes Relative: 14 %
Neutro Abs: 0.8 10*3/uL — ABNORMAL LOW (ref 1.7–7.7)
Neutrophils Relative %: 32 %
Platelets: 114 10*3/uL — ABNORMAL LOW (ref 150–400)
RBC: 3.04 MIL/uL — ABNORMAL LOW (ref 3.87–5.11)
RDW: 12.4 % (ref 11.5–15.5)
WBC: 2.6 10*3/uL — ABNORMAL LOW (ref 4.0–10.5)
nRBC: 0 % (ref 0.0–0.2)

## 2021-11-06 LAB — BASIC METABOLIC PANEL
Anion gap: 6 (ref 5–15)
BUN: 32 mg/dL — ABNORMAL HIGH (ref 6–20)
CO2: 17 mmol/L — ABNORMAL LOW (ref 22–32)
Calcium: 8.1 mg/dL — ABNORMAL LOW (ref 8.9–10.3)
Chloride: 113 mmol/L — ABNORMAL HIGH (ref 98–111)
Creatinine, Ser: 1.15 mg/dL — ABNORMAL HIGH (ref 0.44–1.00)
GFR, Estimated: 57 mL/min — ABNORMAL LOW (ref >60)
Glucose, Bld: 100 mg/dL — ABNORMAL HIGH (ref 70–99)
Potassium: 3.9 mmol/L (ref 3.5–5.1)
Sodium: 136 mmol/L (ref 135–145)

## 2021-11-06 LAB — CULTURE, URINE COMPREHENSIVE

## 2021-11-06 NOTE — Plan of Care (Signed)

## 2021-11-06 NOTE — Progress Notes (Signed)
Mobility Specialist - Progress Note    11/06/21 1500  Mobility  Activity Ambulated independently in hallway;Stood at bedside;Dangled on edge of bed  Level of Assistance Independent  Assistive Device None  Distance Ambulated (ft) 320 ft  Activity Response Tolerated well  $Mobility charge 1 Mobility    Pt ambulates 2 laps around nursing station voicing no complaints and returns to bed with needs in reach.  Lindsay Dean Mobility Specialist 11/06/21, 3:39 PM

## 2021-11-06 NOTE — Progress Notes (Signed)
PROGRESS NOTE    Lindsay Dean  XBM:[id number] DOB: 1965/10/12 DOA: 11/04/2021 PCP: Preston Fleetingevelo, Adrian Mancheno, MD    Brief Narrative:  56 years old female with past medical history of ureterolithiasis status post stent placement on previous admission by urology presented to the hospital with dysuria, severe flank pain.  Patient stated that she had been having increasing urgency frequency and dysuria.  Used video interpreter for the patient.  Patient saw urology yesterday and urine culture was sent in.  Patient was started on Bactrim for the same but due to persistent of symptoms patient came to the hospital.  Patient denies any vomiting but had nausea at home.  She did have chills but no overt fever.  Denies any chest pain, shortness of breath, cough or dyspnea.  Denies any dizziness lightheadedness or syncope.  Denies any diarrhea or constipation   In the ED, patient had mildly low blood pressure but not tachycardia.  No fever.  Labs showed WBC at 6.1.  Sodium of 132 with creatinine elevated at 1.9 from baseline 0.9.  LFTs within normal limits.  Lipase 72.  Urinalysis showed red urine with large hemoglobin and protein with leukocytes and white cells more than 50.  Blood culture and urine culture was sent from the ED.  Patient received Rocephin 1 g IV, IV fluid bolus 1 L, morphine IV and was considered for admission to the hospital for further evaluation and treatment.  Urology Dr Virl DiamondStoiff was notified from the ED who will be consulting the patient while in the hospital.  Seen in consultation by urology.  CT reviewed.  No hydronephrosis.  Stent in good position.  No indication for stent exchange at this time`  5/18: Patient with intractable nausea and vomiting today.  Inability to tolerate p.o.  Assessment & Plan:   Principal Problem:   UTI (urinary tract infection) Active Problems:   Back pain   Hypertension   Lupus (HCC)   Diabetes mellitus without complication (HCC)   Hyponatremia    AKI (acute kidney injury) (HCC)   Pyelonephritis  Acute UTI with flank pain.  Possibility of acute pyelonephritis.   Patient was treated with antibiotics as outpatient, likely impacting current culture No fever or leukocytosis at this time  Still with flank pain Patient reports small blood in urine Plan: Continue IV Rocephin Transition to p.o. antibiotics tomorrow DC IVF Daily renal function Monitor vitals and fever curve Anticipate discharge 5/19  Intractable nausea and vomiting Unclear etiology No evidence of obstruction/ileus As needed Zofran   Acute kidney injury.   Admission creatinine at 1.9 from previous creatinine at 0.9 on 10/08/2021.   Could be secondary to volume depletion/use of HCTZ Advil losartan and Bactrim.  Kidney function improving but not at baseline Plan: DC IVF Monitor UOP Hold hydrochlorothiazide, losartan Avoid NSAIDs Daily renal function Tentative plan discharge home 5/19 if kidney function improved    Mild hyponatremia.  Resolved  likely secondary to volume depletion.   Daily BMP   History of hypertension Was mildly hypotensive in the ED.  Required IV fluids.   Hold hydrochlorothiazide, losartan Currently holding atenolol, restart as appropriate   Diabetes mellitus type 2 without complication Latest hemoglobin A1c of 5.9.   Continue sliding scale insulin and glucose monitoring while in the hospital.   Diabetic diet.   History of lupus Off Imuran for 1 year as per rheumatology Follows up with rheumatology as outpatient   DVT prophylaxis: SCDs.  Chemoprophylaxis currently on hold Code Status: Full Family Communication:None  today.  Offered to call but patient declined Disposition Plan: Status is: Inpatient Remains inpatient appropriate because: Intractable nausea and vomiting in setting of pyelonephritis.  Anticipate discharge 5/19     Level of care: Med-Surg  Consultants:  Urology  Procedures:   None  Antimicrobials: Ceftriaxone   Subjective: Seen and examined.  History conducted in [place].  No visible distress.  Endorses intractable nausea vomiting  Objective: Vitals:   11/05/21 2002 11/06/21 0440 11/06/21 0500 11/06/21 0748  BP: 106/67 101/62  124/70  Pulse: 69 66  72  Resp: 16 18  18   Temp: 98.8 F (37.1 C) 98.7 F (37.1 C)  98.1 F (36.7 C)  TempSrc:      SpO2: 100% 98%  99%  Weight:   69.5 kg   Height:        Intake/Output Summary (Last 24 hours) at 11/06/2021 1556 Last data filed at 11/06/2021 1428 Gross per 24 hour  Intake 1754.11 ml  Output --  Net 1754.11 ml   Filed Weights   11/04/21 1041 11/05/21 0500 11/06/21 0500  Weight: 68 kg 68.6 kg 69.5 kg    Examination:  General exam: NAD Respiratory system: Clear to auscultation. Respiratory effort normal. Cardiovascular system: S1-S2, RRR, no murmurs, no pedal edema Gastrointestinal system: Soft, nondistended, mild TTP left flank Central nervous system: Alert and oriented. No focal neurological deficits. Extremities: Symmetric 5 x 5 power. Skin: No rashes, lesions or ulcers Psychiatry: Judgement and insight appear normal. Mood & affect appropriate.     Data Reviewed: I have personally reviewed following labs and imaging studies  CBC: Recent Labs  Lab 11/04/21 1042 11/05/21 0540 11/06/21 0743  WBC 6.1 2.5* 2.6*  NEUTROABS 5.3  --  0.8*  HGB 12.4 9.6* 9.1*  HCT 38.5 29.0* 28.1*  MCV 93.7 93.5 92.4  PLT 178 118* 114*   Basic Metabolic Panel: Recent Labs  Lab 11/04/21 1042 11/05/21 0540 11/06/21 0743  NA 132* 134* 136  K 5.0 4.5 3.9  CL 109 111 113*  CO2 14* 16* 17*  GLUCOSE 141* 86 100*  BUN 60* 45* 32*  CREATININE 1.98* 1.58* 1.15*  CALCIUM 9.7 8.5* 8.1*  MG  --  1.9  --   PHOS  --  3.7  --    GFR: Estimated Creatinine Clearance: 45.5 mL/min (A) (by C-G formula based on SCr of 1.15 mg/dL (H)). Liver Function Tests: Recent Labs  Lab 11/04/21 1042 11/05/21 0540  AST 32  84*  ALT 31 72*  ALKPHOS 94 100  BILITOT 0.5 0.2*  PROT 9.5* 7.5  ALBUMIN 3.5 2.8*   Recent Labs  Lab 11/04/21 1042  LIPASE 72*   No results for input(s): AMMONIA in the last 168 hours. Coagulation Profile: No results for input(s): INR, PROTIME in the last 168 hours. Cardiac Enzymes: No results for input(s): CKTOTAL, CKMB, CKMBINDEX, TROPONINI in the last 168 hours. BNP (last 3 results) No results for input(s): PROBNP in the last 8760 hours. HbA1C: No results for input(s): HGBA1C in the last 72 hours. CBG: No results for input(s): GLUCAP in the last 168 hours. Lipid Profile: No results for input(s): CHOL, HDL, LDLCALC, TRIG, CHOLHDL, LDLDIRECT in the last 72 hours. Thyroid Function Tests: No results for input(s): TSH, T4TOTAL, FREET4, T3FREE, THYROIDAB in the last 72 hours. Anemia Panel: No results for input(s): VITAMINB12, FOLATE, FERRITIN, TIBC, IRON, RETICCTPCT in the last 72 hours. Sepsis Labs: Recent Labs  Lab 11/04/21 1512 11/04/21 1735  LATICACIDVEN 0.9 1.5    Recent  Results (from the past 240 hour(s))  Microscopic Examination     Status: Abnormal   Collection Time: 11/03/21  8:48 AM   Urine  Result Value Ref Range Status   WBC, UA >30 (A) 0 - 5 /hpf Final   RBC >30 (A) 0 - 2 /hpf Final   Epithelial Cells (non renal) 0-10 0 - 10 /hpf Final   Bacteria, UA Moderate (A) None seen/Few Final  CULTURE, URINE COMPREHENSIVE     Status: None   Collection Time: 11/03/21 10:00 AM   Specimen: Urine   UR  Result Value Ref Range Status   Urine Culture, Comprehensive Final report  Final   Organism ID, Bacteria Comment  Final    Comment: Mixed urogenital flora 10,000-25,000 colony forming units per mL   Urine Culture     Status: None   Collection Time: 11/04/21  2:10 PM   Specimen: Urine, Clean Catch  Result Value Ref Range Status   Specimen Description   Final    URINE, CLEAN CATCH Performed at [facility], [address] [place], [place] [address]     Special Requests   Final    NONE Performed at [facility], [address], [place], [place] [address]    Culture   Final    NO GROWTH Performed at [facility], [address], [place], [place] [address]    Report Status 11/05/2021 FINAL  Final  Blood culture (routine x 2)     Status: None (Preliminary result)   Collection Time: 11/04/21  6:16 PM   Specimen: BLOOD  Result Value Ref Range Status   Specimen Description BLOOD BLOOD LEFT ARM  Final   Special Requests   Final    BOTTLES DRAWN AEROBIC AND ANAEROBIC Blood Culture adequate volume   Culture   Final    NO GROWTH 2 DAYS Performed at [facility], [address], [place], [place] [address]    Report Status PENDING  Incomplete  Blood culture (routine x 2)     Status: None (Preliminary result)   Collection Time: 11/04/21  6:18 PM   Specimen: BLOOD  Result Value Ref Range Status   Specimen Description BLOOD LEFT ANTECUBITAL  Final   Special Requests   Final    BOTTLES DRAWN AEROBIC AND ANAEROBIC Blood Culture adequate volume   Culture   Final    NO GROWTH 2 DAYS Performed at [facility], [address], [place], [place] [address]    Report Status PENDING  Incomplete         Radiology Studies: DG Abd 1 View  Result Date: 11/06/2021 CLINICAL DATA:  Constipation EXAM: ABDOMEN - 1 VIEW COMPARISON:  CT done on 11/04/2021 FINDINGS: Bowel gas pattern is nonspecific. Moderate amount of stool is seen in colon. There is no fecal impaction in the rectosigmoid. Left ureteral stent is noted in place. There is mm calcific density overlying the right kidney. Surgical clips are seen in gallbladder fossa. Degenerative changes are noted in the lower lumbar spine. Vascular calcifications are seen. IMPRESSION: Nonspecific bowel gas pattern. Moderate amount of stool is seen in colon without signs of fecal impaction in the rectosigmoid. Left ureteral stent is noted in place. There is possible 2 mm right  renal calculus. Electronically Signed   By: Ernie Avena M.D.   On: 11/06/2021 14:02        Scheduled Meds:  atorvastatin  20 mg Oral Daily   docusate sodium  100 mg  Oral BID   pantoprazole  40 mg Oral Daily   Continuous Infusions:  sodium chloride 100 mL/hr at 11/06/21 1207   cefTRIAXone (ROCEPHIN)  IV 2 g (11/06/21 1530)     LOS: 1 day       Tresa Moore, MD [facility]   If 7PM-7AM, please contact night-coverage  11/06/2021, 3:56 PM

## 2021-11-07 MED ORDER — OXYCODONE HCL 5 MG PO TABS: 5.0000 mg | ORAL_TABLET | Freq: Three times a day (TID) | ORAL | 0 refills | Status: DC | PRN

## 2021-11-07 MED ORDER — CIPROFLOXACIN HCL 500 MG PO TABS: 500.0000 mg | ORAL_TABLET | Freq: Two times a day (BID) | ORAL | 0 refills | Status: AC

## 2021-11-07 MED ORDER — DOCUSATE SODIUM 100 MG PO CAPS
100.0000 mg | ORAL_CAPSULE | Freq: Two times a day (BID) | ORAL | 0 refills | Status: DC
Start: 2021-11-07 — End: 2022-09-08

## 2021-11-07 MED ORDER — ONDANSETRON HCL 4 MG PO TABS: 4.0000 mg | ORAL_TABLET | Freq: Four times a day (QID) | ORAL | 0 refills | Status: DC | PRN

## 2021-11-07 NOTE — TOC CM/SW Note (Signed)
Patient has orders to discharge home today. Chart reviewed. PCP is Presley Raddle, MD. On room air. No wounds. No TOC needs identified. CSW signing off.  Charlynn Court, CSW [phone]

## 2021-11-07 NOTE — Progress Notes (Signed)
Discharge instructions reviewed with patient via Spanish interpreter including followup visits and new medications/medication changes.  Understanding was verbalized and all questions were answered.  IV removed without complication; patient tolerated well.  Patient discharged home via wheelchair in stable condition escorted by volunteer staff.

## 2021-11-07 NOTE — Discharge Summary (Signed)
Physician Discharge Summary  Marijean Duhon [id number] DOB: [birth year] DOA: 11/04/2021  PCP: [name], MD  Admit date: 11/04/2021 Discharge date: 11/07/2021  Admitted From: Home Disposition: Home  Recommendations for Outpatient Follow-up:  Follow up with PCP in 1-2 weeks Follow-up outpatient urology  Home Health: No Equipment/Devices: None  Discharge Condition: Stable CODE STATUS: Full Diet recommendation: Regular  Brief/Interim Summary: 56 years old female with past medical history of ureterolithiasis status post stent placement on previous admission by urology presented to the hospital with dysuria, severe flank pain.  Patient stated that she had been having increasing urgency frequency and dysuria.  Used video interpreter for the patient.  Patient saw urology yesterday and urine culture was sent in.  Patient was started on Bactrim for the same but due to persistent of symptoms patient came to the hospital.  Patient denies any vomiting but had nausea at home.  She did have chills but no overt fever.  Denies any chest pain, shortness of breath, cough or dyspnea.  Denies any dizziness lightheadedness or syncope.  Denies any diarrhea or constipation   In the ED, patient had mildly low blood pressure but not tachycardia.  No fever.  Labs showed WBC at 6.1.  Sodium of 132 with creatinine elevated at 1.9 from baseline 0.9.  LFTs within normal limits.  Lipase 72.  Urinalysis showed red urine with large hemoglobin and protein with leukocytes and white cells more than 50.  Blood culture and urine culture was sent from the ED.  Patient received Rocephin 1 g IV, IV fluid bolus 1 L, morphine IV and was considered for admission to the hospital for further evaluation and treatment.  Urology Dr Elenor Quinones was notified from the ED who will be consulting the patient while in the hospital.   Seen in consultation by urology.  CT reviewed.  No hydronephrosis.  Stent in good position.  No  indication for stent exchange at this time`   5/18: Patient with intractable nausea and vomiting today.  Inability to tolerate p.o. 5/19: Nausea vomiting resolved.  Patient stable for discharge    Discharge Diagnoses:  Principal Problem:   UTI (urinary tract infection) Active Problems:   Back pain   Hypertension   Lupus (HCC)   Diabetes mellitus without complication (HCC)   Hyponatremia   AKI (acute kidney injury) ([place])   Pyelonephritis Acute UTI with flank pain.  Possibility of acute pyelonephritis.   Patient was treated with antibiotics as outpatient, likely impacting current culture No fever or leukocytosis at this time  Flank pain and small-volume hematuria resolved Plan: Discontinue IV antibiotics.  Start p.o. ciprofloxacin.  Complete total 7-day antibiotic course.  5 additional days prescribed.  Stable for discharge.  Follow-up outpatient PCP and urology.  Intractable nausea and vomiting Unclear etiology No evidence of obstruction/ileus As needed Zofran Resolved at time of discharge   Acute kidney injury.   Admission creatinine at 1.9 from previous creatinine at 0.9 on 10/08/2021.   Could be secondary to volume depletion/use of HCTZ Advil losartan and Bactrim.  Kidney function improving but not at baseline Plan: Kidney function approaching baseline at time of discharge.  Okay to resume home medications.  Importance of adequate hydration stressed.  Follow-up outpatient PCP     Mild hyponatremia.  Resolved     History of hypertension Was mildly hypotensive in the ED.  Required IV fluids.   BP meds held inpatient.  Can resume at time of discharge   Diabetes mellitus type 2 without  complication Latest hemoglobin A1c of 5.9.      History of lupus Off Imuran for 1 year as per rheumatology Follows up with rheumatology as outpatient   Discharge Instructions  Discharge Instructions     Diet - low sodium heart healthy   Complete by: As directed    Increase  activity slowly   Complete by: As directed       Allergies as of 11/07/2021       Reactions   Tramadol Nausea Only   Other reaction(s): Dizziness        Medication List     STOP taking these medications    ibuprofen 800 MG tablet Commonly known as: ADVIL   sulfamethoxazole-trimethoprim 800-160 MG tablet Commonly known as: BACTRIM DS       TAKE these medications    atenolol 50 MG tablet Commonly known as: TENORMIN Take 50 mg by mouth daily.   atorvastatin 20 MG tablet Commonly known as: LIPITOR Take 20 mg by mouth daily.   ciprofloxacin 500 MG tablet Commonly known as: Cipro Take 1 tablet (500 mg total) by mouth 2 (two) times daily for 5 days.   docusate sodium 100 MG capsule Commonly known as: COLACE Take 1 capsule (100 mg total) by mouth 2 (two) times daily.   hydrochlorothiazide 25 MG tablet Commonly known as: HYDRODIURIL Take 25 mg by mouth daily.   losartan 100 MG tablet Commonly known as: COZAAR Take 100 mg by mouth daily.   omeprazole 20 MG capsule Commonly known as: PRILOSEC Take 20 mg by mouth daily.   ondansetron 4 MG tablet Commonly known as: ZOFRAN Take 1 tablet (4 mg total) by mouth every 6 (six) hours as needed for nausea.   oxyCODONE 5 MG immediate release tablet Commonly known as: Roxicodone Take 1 tablet (5 mg total) by mouth every 8 (eight) hours as needed for severe pain or breakthrough pain.        Allergies  Allergen Reactions   Tramadol Nausea Only    Other reaction(s): Dizziness    Consultations: Urology   Procedures/Studies: DG Abd 1 View  Result Date: 11/06/2021 CLINICAL DATA:  Constipation EXAM: ABDOMEN - 1 VIEW COMPARISON:  CT done on 11/04/2021 FINDINGS: Bowel gas pattern is nonspecific. Moderate amount of stool is seen in colon. There is no fecal impaction in the rectosigmoid. Left ureteral stent is noted in place. There is mm calcific density overlying the right kidney. Surgical clips are seen in  gallbladder fossa. Degenerative changes are noted in the lower lumbar spine. Vascular calcifications are seen. IMPRESSION: Nonspecific bowel gas pattern. Moderate amount of stool is seen in colon without signs of fecal impaction in the rectosigmoid. Left ureteral stent is noted in place. There is possible 2 mm right renal calculus. Electronically Signed   By: Elmer Picker M.D.   On: 11/06/2021 14:02   VAS [place] ABI WITH/WO TBI  Result Date: 10/23/2021  LOWER EXTREMITY DOPPLER STUDY Patient Name:  Queenie Pier  Date of Exam:   10/23/2021 Medical Rec #: [id number]            Accession #:    [id number] Date of Birth: 1965-08-30           Patient Gender: F Patient Age:   15 years Exam Location:  [facility] Procedure:      VAS [place] ABI WITH/WO TBI Referring Phys: Hortencia Pilar --------------------------------------------------------------------------------  Indications: Peripheral artery disease. Other Factors: Calcified vessels on xray.  Comparison Study: 10/28/2020 Performing  Technologist: Almira Coaster RVS  Examination Guidelines: A complete evaluation includes at minimum, Doppler waveform signals and systolic blood pressure reading at the level of bilateral brachial, anterior tibial, and posterior tibial arteries, when vessel segments are accessible. Bilateral testing is considered an integral part of a complete examination. Photoelectric Plethysmograph (PPG) waveforms and toe systolic pressure readings are included as required and additional duplex testing as needed. Limited examinations for reoccurring indications may be performed as noted.  ABI Findings: +---------+------------------+-----+---------+--------+ Right    Rt Pressure (mmHg)IndexWaveform Comment  +---------+------------------+-----+---------+--------+ Brachial 119                                      +---------+------------------+-----+---------+--------+ ATA      245               2.02 triphasicNC        +---------+------------------+-----+---------+--------+ PTA      145               1.20 biphasic          +---------+------------------+-----+---------+--------+ Great Toe84                0.69 Normal            +---------+------------------+-----+---------+--------+ +---------+------------------+-----+---------+-------+ Left     Lt Pressure (mmHg)IndexWaveform Comment +---------+------------------+-----+---------+-------+ Brachial 121                                     +---------+------------------+-----+---------+-------+ ATA      247               2.04 triphasicNC      +---------+------------------+-----+---------+-------+ PTA      243               2.01 biphasic [place]      +---------+------------------+-----+---------+-------+ Great Toe96                0.79 Normal           +---------+------------------+-----+---------+-------+ +-------+-----------+-----------+------------+------------+ ABI/TBIToday's ABIToday's TBIPrevious ABIPrevious TBI +-------+-----------+-----------+------------+------------+ Right  >1.0 [place]    .69        1.07        .75          +-------+-----------+-----------+------------+------------+ Left   >1.0 [place]    .79        1.13        .78          +-------+-----------+-----------+------------+------------+  Bilateral ABIs appear essentially unchanged compared to prior study on 10/28/2020. Compared to prior study on 10/28/2020. Rt TBIs appear to be decreased compared to prior study on 10/28/2020.  Summary: Right: Resting right ankle-brachial index indicates noncompressible right lower extremity arteries. The right toe-brachial index is normal. Left: Resting left ankle-brachial index indicates noncompressible left lower extremity arteries. The left toe-brachial index is normal.  *See table(s) above for measurements and observations.  Electronically signed by Hortencia Pilar MD on 10/23/2021 at 4:44:41 PM.    Final    CT Renal Stone  Study  Result Date: 11/04/2021 CLINICAL DATA:  Left-sided flank pain. EXAM: CT ABDOMEN AND PELVIS WITHOUT CONTRAST TECHNIQUE: Multidetector CT imaging of the abdomen and pelvis was performed following the standard protocol without IV contrast. RADIATION DOSE REDUCTION: This exam was performed according to the departmental dose-optimization program which includes automated exposure control, adjustment of the mA and/or kV according  to patient size and/or use of iterative reconstruction technique. COMPARISON:  CT abdomen pelvis dated October 06, 2021. FINDINGS: Lower chest: No acute abnormality. Hepatobiliary: No focal liver abnormality is seen. Status post cholecystectomy. No biliary dilatation. Pancreas: Unremarkable. No pancreatic ductal dilatation or surrounding inflammatory changes. Spleen: Normal in size without focal abnormality. Adrenals/Urinary Tract: Adrenal glands are unremarkable. New punctate nonobstructive calculi in the lower pole of the right kidney and upper pole of the left kidney. Resolved right hydroureteronephrosis. New left ureteral stent with resolved left hydroureteronephrosis. Previously seen 6 mm calculus at the left UVJ is no longer identified. The bladder is unremarkable for the degree of distention. Stomach/Bowel: Unchanged small hiatal hernia. Stomach is otherwise within normal limits. Appendix appears normal. No evidence of bowel wall thickening, distention, or inflammatory changes. Diffuse colonic diverticulosis. Vascular/Lymphatic: No significant vascular findings are present. No enlarged abdominal or pelvic lymph nodes. Reproductive: Status post hysterectomy. No adnexal masses. Other: No abdominal wall hernia or abnormality. No abdominopelvic ascites. No pneumoperitoneum. Musculoskeletal: No acute or significant osseous findings. Chronic bilateral L4 pedicle fractures and grade 1 L4-L5 anterolisthesis again noted. IMPRESSION: 1. New left ureteral stent with resolved left  hydroureteronephrosis. Previously seen 6 mm calculus at the left UVJ is no longer identified. 2. New punctate nonobstructive bilateral nephrolithiasis. Electronically Signed   By: Titus Dubin M.D.   On: 11/04/2021 14:28      Subjective: Seen and examined the time of discharge.  Stable no distress.  Stable for discharge home.  Discharge Exam: Vitals:   11/06/21 1955 11/07/21 0406  BP: 123/79 119/67  Pulse: (!) 59 (!) 58  Resp: 18 17  Temp: 98.8 F (37.1 C) 98 F (36.7 C)  SpO2: 100% 98%   Vitals:   11/06/21 1557 11/06/21 1955 11/07/21 0406 11/07/21 0407  BP: (!) 138/91 123/79 119/67   Pulse: 71 (!) 59 (!) 58   Resp: 18 18 17    Temp: 98.2 F (36.8 C) 98.8 F (37.1 C) 98 F (36.7 C)   TempSrc:   Oral   SpO2: 99% 100% 98%   Weight:    69.2 kg  Height:        General: Pt is alert, awake, not in acute distress Cardiovascular: RRR, S1/S2 +, no rubs, no gallops Respiratory: CTA bilaterally, no wheezing, no rhonchi Abdominal: Soft, NT, ND, bowel sounds + Extremities: no edema, no cyanosis    The results of significant diagnostics from this hospitalization (including imaging, microbiology, ancillary and laboratory) are listed below for reference.     Microbiology: Recent Results (from the past 240 hour(s))  Microscopic Examination     Status: Abnormal   Collection Time: 11/03/21  8:48 AM   Urine  Result Value Ref Range Status   WBC, UA >30 (A) 0 - 5 /hpf Final   RBC >30 (A) 0 - 2 /hpf Final   Epithelial Cells (non renal) 0-10 0 - 10 /hpf Final   Bacteria, UA Moderate (A) None seen/Few Final  CULTURE, URINE COMPREHENSIVE     Status: None   Collection Time: 11/03/21 10:00 AM   Specimen: Urine   UR  Result Value Ref Range Status   Urine Culture, Comprehensive Final report  Final   Organism ID, Bacteria Comment  Final    Comment: Mixed urogenital flora 10,000-25,000 colony forming units per mL   Urine Culture     Status: None   Collection Time: 11/04/21  2:10 PM    Specimen: Urine, Clean Catch  Result Value Ref Range  Status   Specimen Description   Final    URINE, CLEAN CATCH Performed at [facility], [address] [place], [place] [address]    Special Requests   Final    NONE Performed at [facility], [address] [place], [place] [address]    Culture   Final    NO GROWTH Performed at [facility], [place] [address] [place], [place] [address]    Report Status 11/05/2021 FINAL  Final  Blood culture (routine x 2)     Status: None (Preliminary result)   Collection Time: 11/04/21  6:16 PM   Specimen: BLOOD  Result Value Ref Range Status   Specimen Description BLOOD BLOOD LEFT ARM  Final   Special Requests   Final    BOTTLES DRAWN AEROBIC AND ANAEROBIC Blood Culture adequate volume   Culture   Final    NO GROWTH 3 DAYS Performed at [facility], [address], [place], [place] [address]    Report Status PENDING  Incomplete  Blood culture (routine x 2)     Status: None (Preliminary result)   Collection Time: 11/04/21  6:18 PM   Specimen: BLOOD  Result Value Ref Range Status   Specimen Description BLOOD LEFT ANTECUBITAL  Final   Special Requests   Final    BOTTLES DRAWN AEROBIC AND ANAEROBIC Blood Culture adequate volume   Culture   Final    NO GROWTH 3 DAYS Performed at [facility], [address] [place], [place] [address]    Report Status PENDING  Incomplete     Labs: BNP (last 3 results) No results for input(s): BNP in the last 8760 hours. Basic Metabolic Panel: Recent Labs  Lab 11/04/21 1042 11/05/21 0540 11/06/21 0743  NA 132* 134* 136  K 5.0 4.5 3.9  CL 109 111 113*  CO2 14* 16* 17*  GLUCOSE 141* 86 100*  BUN 60* 45* 32*  CREATININE 1.98* 1.58* 1.15*  CALCIUM 9.7 8.5* 8.1*  MG  --  1.9  --   PHOS  --  3.7  --    Liver Function Tests: Recent Labs  Lab 11/04/21 1042 11/05/21 0540  AST 32 84*  ALT 31 72*  ALKPHOS 94 100  BILITOT 0.5 0.2*  PROT 9.5* 7.5   ALBUMIN 3.5 2.8*   Recent Labs  Lab 11/04/21 1042  LIPASE 72*   No results for input(s): AMMONIA in the last 168 hours. CBC: Recent Labs  Lab 11/04/21 1042 11/05/21 0540 11/06/21 0743  WBC 6.1 2.5* 2.6*  NEUTROABS 5.3  --  0.8*  HGB 12.4 9.6* 9.1*  HCT 38.5 29.0* 28.1*  MCV 93.7 93.5 92.4  PLT 178 118* 114*   Cardiac Enzymes: No results for input(s): CKTOTAL, CKMB, CKMBINDEX, TROPONINI in the last 168 hours. BNP: Invalid input(s): POCBNP CBG: No results for input(s): GLUCAP in the last 168 hours. D-Dimer No results for input(s): DDIMER in the last 72 hours. Hgb A1c No results for input(s): HGBA1C in the last 72 hours. Lipid Profile No results for input(s): CHOL, HDL, LDLCALC, TRIG, CHOLHDL, LDLDIRECT in the last 72 hours. Thyroid function studies No results for input(s): TSH, T4TOTAL, T3FREE, THYROIDAB in the last 72 hours.  Invalid input(s): FREET3 Anemia work up No results for input(s): VITAMINB12, FOLATE, FERRITIN, TIBC, IRON, RETICCTPCT in the last 72 hours. Urinalysis    Component Value Date/Time   COLORURINE RED (A) 11/04/2021 1052   APPEARANCEUR CLOUDY (A) 11/04/2021 1052   APPEARANCEUR Cloudy (  A) 11/03/2021 0848   LABSPEC 1.018 11/04/2021 1052   PHURINE 5.0 11/04/2021 1052   GLUCOSEU NEGATIVE 11/04/2021 1052   HGBUR LARGE (A) 11/04/2021 1052   BILIRUBINUR NEGATIVE 11/04/2021 1052   BILIRUBINUR Negative 11/03/2021 0848   KETONESUR NEGATIVE 11/04/2021 1052   PROTEINUR 100 (A) 11/04/2021 1052   NITRITE NEGATIVE 11/04/2021 1052   LEUKOCYTESUR LARGE (A) 11/04/2021 1052   Sepsis Labs Invalid input(s): PROCALCITONIN,  WBC,  LACTICIDVEN Microbiology Recent Results (from the past 240 hour(s))  Microscopic Examination     Status: Abnormal   Collection Time: 11/03/21  8:48 AM   Urine  Result Value Ref Range Status   WBC, UA >30 (A) 0 - 5 /hpf Final   RBC >30 (A) 0 - 2 /hpf Final   Epithelial Cells (non renal) 0-10 0 - 10 /hpf Final   Bacteria, UA  Moderate (A) None seen/Few Final  CULTURE, URINE COMPREHENSIVE     Status: None   Collection Time: 11/03/21 10:00 AM   Specimen: Urine   UR  Result Value Ref Range Status   Urine Culture, Comprehensive Final report  Final   Organism ID, Bacteria Comment  Final    Comment: Mixed urogenital flora 10,000-25,000 colony forming units per mL   Urine Culture     Status: None   Collection Time: 11/04/21  2:10 PM   Specimen: Urine, Clean Catch  Result Value Ref Range Status   Specimen Description   Final    URINE, CLEAN CATCH Performed at [facility], [address], [place], [place] [address]    Special Requests   Final    NONE Performed at [facility], [address] [place], [place] [address]    Culture   Final    NO GROWTH Performed at [facility], [place] [address] [place], [place] [address]    Report Status 11/05/2021 FINAL  Final  Blood culture (routine x 2)     Status: None (Preliminary result)   Collection Time: 11/04/21  6:16 PM   Specimen: BLOOD  Result Value Ref Range Status   Specimen Description BLOOD BLOOD LEFT ARM  Final   Special Requests   Final    BOTTLES DRAWN AEROBIC AND ANAEROBIC Blood Culture adequate volume   Culture   Final    NO GROWTH 3 DAYS Performed at [facility], [address] [place], [place] [address]    Report Status PENDING  Incomplete  Blood culture (routine x 2)     Status: None (Preliminary result)   Collection Time: 11/04/21  6:18 PM   Specimen: BLOOD  Result Value Ref Range Status   Specimen Description BLOOD LEFT ANTECUBITAL  Final   Special Requests   Final    BOTTLES DRAWN AEROBIC AND ANAEROBIC Blood Culture adequate volume   Culture   Final    NO GROWTH 3 DAYS Performed at [facility], [address], [place], [place] [address]    Report Status PENDING  Incomplete     Time coordinating discharge: Over 30 minutes  SIGNED:   Sidney Ace, MD  [facility] 11/07/2021, 10:56 AM Pager   If 7PM-7AM, please contact night-coverage

## 2021-11-09 LAB — CULTURE, BLOOD (ROUTINE X 2)
Culture: NO GROWTH
Culture: NO GROWTH
Special Requests: ADEQUATE
Special Requests: ADEQUATE

## 2021-11-14 ENCOUNTER — Ambulatory Visit (INDEPENDENT_AMBULATORY_CARE_PROVIDER_SITE_OTHER): Payer: Medicaid Other | Admitting: Urology

## 2021-11-14 ENCOUNTER — Encounter: Payer: Self-pay | Admitting: Urology

## 2021-11-14 ENCOUNTER — Telehealth: Payer: Self-pay

## 2021-11-14 VITALS — BP 159/99 | HR 84

## 2021-11-14 DIAGNOSIS — N200 Calculus of kidney: Secondary | ICD-10-CM | POA: Diagnosis not present

## 2021-11-14 DIAGNOSIS — Z466 Encounter for fitting and adjustment of urinary device: Secondary | ICD-10-CM

## 2021-11-14 LAB — URINALYSIS, COMPLETE
Bilirubin, UA: NEGATIVE
Glucose, UA: NEGATIVE
Ketones, UA: NEGATIVE
Nitrite, UA: NEGATIVE
Specific Gravity, UA: 1.015 (ref 1.005–1.030)
Urobilinogen, Ur: 0.2 mg/dL (ref 0.2–1.0)
pH, UA: 6 (ref 5.0–7.5)

## 2021-11-14 LAB — MICROSCOPIC EXAMINATION
RBC, Urine: >30 /hpf — AB (ref 0–2)
WBC, UA: >30 /hpf — AB (ref 0–5)

## 2021-11-14 MED ORDER — CIPROFLOXACIN HCL 500 MG PO TABS
500.0000 mg | ORAL_TABLET | Freq: Once | ORAL | Status: AC
  Administered 2021-11-14: 500 mg via ORAL

## 2021-11-14 NOTE — Patient Instructions (Signed)

## 2021-11-14 NOTE — Progress Notes (Signed)
Indications: Patient is 56 y.o., who is s/p ureteroscopic removal of a left distal ureteral calculus 10/07/2021.  She was scheduled for stent removal x2 however had symptomatic infections.  She was admitted [facility] May 16-19 for UTI and was treated with IV antibiotics and was discharged on Cipro.  Urine and blood cultures were negative the patient is presenting today for stent removal.  Procedure:  Flexible Cystoscopy with stent removal (25003)  Timeout was performed and the correct patient, procedure and participants were identified.    Description:  The patient was prepped and draped in the usual sterile fashion. Flexible cystosopy was performed.  The stent was visualized, grasped, and removed intact without difficulty. The patient tolerated the procedure well.  A single dose of oral antibiotics was given.  Complications:  None  Plan:  Call for fever or flank pain post in removal Stone analysis was CaOxMono/CaOxDi/hydroxyapatite 75/20/5 She is a recurrent stone former and was interested in pursuing a metabolic evaluation.  This will be arranged through [facility] 3 month follow-up with [facility]   Irineo Axon, MD

## 2021-11-14 NOTE — Telephone Encounter (Signed)
Pt needs [facility] faxed

## 2021-11-14 NOTE — Addendum Note (Signed)
Addended by: Veneta Penton on: 11/14/2021 11:27 AM   Modules accepted: Orders

## 2021-11-18 NOTE — Telephone Encounter (Signed)
faxed

## 2021-12-17 ENCOUNTER — Inpatient Hospital Stay
Admission: EM | Admit: 2021-12-17 | Discharge: 2021-12-23 | DRG: 872 | Disposition: A | Discharge status: Home / Self Care | Payer: Medicaid Other | Attending: Internal Medicine | Admitting: Internal Medicine

## 2021-12-17 DIAGNOSIS — E119 Type 2 diabetes mellitus without complications: Secondary | ICD-10-CM

## 2021-12-17 DIAGNOSIS — B377 Candidal sepsis: Principal | ICD-10-CM | POA: Diagnosis present

## 2021-12-17 DIAGNOSIS — I129 Hypertensive chronic kidney disease with stage 1 through stage 4 chronic kidney disease, or unspecified chronic kidney disease: Secondary | ICD-10-CM | POA: Diagnosis present

## 2021-12-17 DIAGNOSIS — Z885 Allergy status to narcotic agent status: Secondary | ICD-10-CM

## 2021-12-17 DIAGNOSIS — R6510 Systemic inflammatory response syndrome (SIRS) of non-infectious origin without acute organ dysfunction: Secondary | ICD-10-CM

## 2021-12-17 DIAGNOSIS — R1030 Lower abdominal pain, unspecified: Secondary | ICD-10-CM

## 2021-12-17 DIAGNOSIS — I10 Essential (primary) hypertension: Secondary | ICD-10-CM

## 2021-12-17 DIAGNOSIS — N1831 Chronic kidney disease, stage 3a: Secondary | ICD-10-CM | POA: Diagnosis present

## 2021-12-17 DIAGNOSIS — N189 Chronic kidney disease, unspecified: Secondary | ICD-10-CM

## 2021-12-17 DIAGNOSIS — K5730 Diverticulosis of large intestine without perforation or abscess without bleeding: Secondary | ICD-10-CM | POA: Diagnosis present

## 2021-12-17 DIAGNOSIS — E1151 Type 2 diabetes mellitus with diabetic peripheral angiopathy without gangrene: Secondary | ICD-10-CM | POA: Diagnosis present

## 2021-12-17 DIAGNOSIS — N179 Acute kidney failure, unspecified: Secondary | ICD-10-CM | POA: Diagnosis present

## 2021-12-17 DIAGNOSIS — G4730 Sleep apnea, unspecified: Secondary | ICD-10-CM | POA: Diagnosis present

## 2021-12-17 DIAGNOSIS — K219 Gastro-esophageal reflux disease without esophagitis: Secondary | ICD-10-CM

## 2021-12-17 DIAGNOSIS — Z66 Do not resuscitate: Secondary | ICD-10-CM | POA: Diagnosis present

## 2021-12-17 DIAGNOSIS — N139 Obstructive and reflux uropathy, unspecified: Secondary | ICD-10-CM | POA: Diagnosis present

## 2021-12-17 DIAGNOSIS — Z833 Family history of diabetes mellitus: Secondary | ICD-10-CM

## 2021-12-17 DIAGNOSIS — Z79899 Other long term (current) drug therapy: Secondary | ICD-10-CM

## 2021-12-17 DIAGNOSIS — Z8249 Family history of ischemic heart disease and other diseases of the circulatory system: Secondary | ICD-10-CM

## 2021-12-17 DIAGNOSIS — E785 Hyperlipidemia, unspecified: Secondary | ICD-10-CM

## 2021-12-17 DIAGNOSIS — N390 Urinary tract infection, site not specified: Secondary | ICD-10-CM

## 2021-12-17 DIAGNOSIS — R509 Fever, unspecified: Principal | ICD-10-CM

## 2021-12-17 DIAGNOSIS — M329 Systemic lupus erythematosus, unspecified: Secondary | ICD-10-CM | POA: Diagnosis present

## 2021-12-17 DIAGNOSIS — N136 Pyonephrosis: Secondary | ICD-10-CM | POA: Diagnosis present

## 2021-12-17 DIAGNOSIS — N201 Calculus of ureter: Secondary | ICD-10-CM | POA: Diagnosis present

## 2021-12-17 DIAGNOSIS — A4150 Gram-negative sepsis, unspecified: Secondary | ICD-10-CM

## 2021-12-17 DIAGNOSIS — Z8261 Family history of arthritis: Secondary | ICD-10-CM

## 2021-12-17 DIAGNOSIS — E1122 Type 2 diabetes mellitus with diabetic chronic kidney disease: Secondary | ICD-10-CM | POA: Diagnosis present

## 2021-12-17 LAB — URINALYSIS, ROUTINE W REFLEX MICROSCOPIC
Bacteria, UA: NONE SEEN
Bilirubin Urine: NEGATIVE
Glucose, UA: NEGATIVE mg/dL
Ketones, ur: NEGATIVE mg/dL
Nitrite: NEGATIVE
Protein, ur: 100 mg/dL — AB
Specific Gravity, Urine: 1.013 (ref 1.005–1.030)
Squamous Epithelial / HPF: NONE SEEN (ref 0–5)
WBC, UA: >50 WBC/hpf — ABNORMAL HIGH (ref 0–5)
pH: 5 (ref 5.0–8.0)

## 2021-12-17 LAB — LACTIC ACID, PLASMA: Lactic Acid, Venous: 1.6 mmol/L (ref 0.5–1.9)

## 2021-12-17 LAB — COMPREHENSIVE METABOLIC PANEL
ALT: 21 U/L (ref 0–44)
AST: 27 U/L (ref 15–41)
Albumin: 3.6 g/dL (ref 3.5–5.0)
Alkaline Phosphatase: 95 U/L (ref 38–126)
Anion gap: 9 (ref 5–15)
BUN: 26 mg/dL — ABNORMAL HIGH (ref 6–20)
CO2: 19 mmol/L — ABNORMAL LOW (ref 22–32)
Calcium: 9 mg/dL (ref 8.9–10.3)
Chloride: 108 mmol/L (ref 98–111)
Creatinine, Ser: 1.31 mg/dL — ABNORMAL HIGH (ref 0.44–1.00)
GFR, Estimated: 49 mL/min — ABNORMAL LOW (ref >60)
Glucose, Bld: 185 mg/dL — ABNORMAL HIGH (ref 70–99)
Potassium: 4 mmol/L (ref 3.5–5.1)
Sodium: 136 mmol/L (ref 135–145)
Total Bilirubin: 0.5 mg/dL (ref 0.3–1.2)
Total Protein: 9 g/dL — ABNORMAL HIGH (ref 6.5–8.1)

## 2021-12-17 LAB — CBC
HCT: 35.9 % — ABNORMAL LOW (ref 36.0–46.0)
Hemoglobin: 11.7 g/dL — ABNORMAL LOW (ref 12.0–15.0)
MCH: 29.5 pg (ref 26.0–34.0)
MCHC: 32.6 g/dL (ref 30.0–36.0)
MCV: 90.7 fL (ref 80.0–100.0)
Platelets: 199 10*3/uL (ref 150–400)
RBC: 3.96 MIL/uL (ref 3.87–5.11)
RDW: 12.7 % (ref 11.5–15.5)
WBC: 11.4 10*3/uL — ABNORMAL HIGH (ref 4.0–10.5)
nRBC: 0 % (ref 0.0–0.2)

## 2021-12-17 LAB — LIPASE, BLOOD: Lipase: 33 U/L (ref 11–51)

## 2021-12-17 MED ORDER — ACETAMINOPHEN 325 MG PO TABS
650.0000 mg | ORAL_TABLET | Freq: Once | ORAL | Status: AC
  Administered 2021-12-17: 650 mg via ORAL

## 2021-12-17 MED ORDER — ACETAMINOPHEN 325 MG PO TABS
ORAL_TABLET | ORAL | Status: AC
  Filled 2021-12-17: qty 2

## 2021-12-17 NOTE — ED Triage Notes (Signed)
Pt BIB EMS for painful urination and lower ABD pain that started about 5 days ago. Pt endorses nausea.

## 2021-12-18 ENCOUNTER — Emergency Department: Payer: Medicaid Other

## 2021-12-18 ENCOUNTER — Inpatient Hospital Stay: Payer: Medicaid Other | Admitting: Radiology

## 2021-12-18 ENCOUNTER — Encounter: Payer: Self-pay | Admitting: Anesthesiology

## 2021-12-18 ENCOUNTER — Encounter
Admission: EM | Disposition: A | Discharge status: Home / Self Care | Payer: Self-pay | Source: Home / Self Care | Attending: Internal Medicine

## 2021-12-18 DIAGNOSIS — N139 Obstructive and reflux uropathy, unspecified: Secondary | ICD-10-CM | POA: Diagnosis not present

## 2021-12-18 DIAGNOSIS — Z8249 Family history of ischemic heart disease and other diseases of the circulatory system: Secondary | ICD-10-CM | POA: Diagnosis not present

## 2021-12-18 DIAGNOSIS — Z79899 Other long term (current) drug therapy: Secondary | ICD-10-CM | POA: Diagnosis not present

## 2021-12-18 DIAGNOSIS — K5730 Diverticulosis of large intestine without perforation or abscess without bleeding: Secondary | ICD-10-CM | POA: Diagnosis present

## 2021-12-18 DIAGNOSIS — I10 Essential (primary) hypertension: Secondary | ICD-10-CM

## 2021-12-18 DIAGNOSIS — M329 Systemic lupus erythematosus, unspecified: Secondary | ICD-10-CM | POA: Diagnosis present

## 2021-12-18 DIAGNOSIS — E785 Hyperlipidemia, unspecified: Secondary | ICD-10-CM | POA: Diagnosis present

## 2021-12-18 DIAGNOSIS — E1122 Type 2 diabetes mellitus with diabetic chronic kidney disease: Secondary | ICD-10-CM | POA: Diagnosis present

## 2021-12-18 DIAGNOSIS — Q6211 Congenital occlusion of ureteropelvic junction: Secondary | ICD-10-CM | POA: Diagnosis not present

## 2021-12-18 DIAGNOSIS — N390 Urinary tract infection, site not specified: Secondary | ICD-10-CM

## 2021-12-18 DIAGNOSIS — N179 Acute kidney failure, unspecified: Secondary | ICD-10-CM

## 2021-12-18 DIAGNOSIS — N136 Pyonephrosis: Secondary | ICD-10-CM | POA: Diagnosis present

## 2021-12-18 DIAGNOSIS — I129 Hypertensive chronic kidney disease with stage 1 through stage 4 chronic kidney disease, or unspecified chronic kidney disease: Secondary | ICD-10-CM | POA: Diagnosis present

## 2021-12-18 DIAGNOSIS — N189 Chronic kidney disease, unspecified: Secondary | ICD-10-CM

## 2021-12-18 DIAGNOSIS — K219 Gastro-esophageal reflux disease without esophagitis: Secondary | ICD-10-CM

## 2021-12-18 DIAGNOSIS — Z66 Do not resuscitate: Secondary | ICD-10-CM | POA: Diagnosis present

## 2021-12-18 DIAGNOSIS — A4150 Gram-negative sepsis, unspecified: Secondary | ICD-10-CM | POA: Diagnosis not present

## 2021-12-18 DIAGNOSIS — N201 Calculus of ureter: Secondary | ICD-10-CM | POA: Diagnosis not present

## 2021-12-18 DIAGNOSIS — E119 Type 2 diabetes mellitus without complications: Secondary | ICD-10-CM

## 2021-12-18 DIAGNOSIS — Z8261 Family history of arthritis: Secondary | ICD-10-CM | POA: Diagnosis not present

## 2021-12-18 DIAGNOSIS — Z833 Family history of diabetes mellitus: Secondary | ICD-10-CM | POA: Diagnosis not present

## 2021-12-18 DIAGNOSIS — E1151 Type 2 diabetes mellitus with diabetic peripheral angiopathy without gangrene: Secondary | ICD-10-CM | POA: Diagnosis present

## 2021-12-18 DIAGNOSIS — Z885 Allergy status to narcotic agent status: Secondary | ICD-10-CM | POA: Diagnosis not present

## 2021-12-18 DIAGNOSIS — G4730 Sleep apnea, unspecified: Secondary | ICD-10-CM | POA: Diagnosis present

## 2021-12-18 DIAGNOSIS — B377 Candidal sepsis: Secondary | ICD-10-CM | POA: Diagnosis present

## 2021-12-18 DIAGNOSIS — N1831 Chronic kidney disease, stage 3a: Secondary | ICD-10-CM | POA: Diagnosis present

## 2021-12-18 HISTORY — PX: IR NEPHROSTOMY PLACEMENT RIGHT: IMG6064

## 2021-12-18 LAB — BASIC METABOLIC PANEL
Anion gap: 5 (ref 5–15)
BUN: 25 mg/dL — ABNORMAL HIGH (ref 6–20)
CO2: 22 mmol/L (ref 22–32)
Calcium: 7.9 mg/dL — ABNORMAL LOW (ref 8.9–10.3)
Chloride: 111 mmol/L (ref 98–111)
Creatinine, Ser: 1.44 mg/dL — ABNORMAL HIGH (ref 0.44–1.00)
GFR, Estimated: 43 mL/min — ABNORMAL LOW (ref >60)
Glucose, Bld: 147 mg/dL — ABNORMAL HIGH (ref 70–99)
Potassium: 4.4 mmol/L (ref 3.5–5.1)
Sodium: 138 mmol/L (ref 135–145)

## 2021-12-18 LAB — CBC
HCT: 34.3 % — ABNORMAL LOW (ref 36.0–46.0)
Hemoglobin: 10.9 g/dL — ABNORMAL LOW (ref 12.0–15.0)
MCH: 29.4 pg (ref 26.0–34.0)
MCHC: 31.8 g/dL (ref 30.0–36.0)
MCV: 92.5 fL (ref 80.0–100.0)
Platelets: 158 10*3/uL (ref 150–400)
RBC: 3.71 MIL/uL — ABNORMAL LOW (ref 3.87–5.11)
RDW: 12.5 % (ref 11.5–15.5)
WBC: 9.7 10*3/uL (ref 4.0–10.5)
nRBC: 0 % (ref 0.0–0.2)

## 2021-12-18 LAB — GLUCOSE, CAPILLARY: Glucose-Capillary: 91 mg/dL (ref 70–99)

## 2021-12-18 LAB — PROCALCITONIN: Procalcitonin: <0.1 ng/mL

## 2021-12-18 SURGERY — CYSTOSCOPY/URETEROSCOPY/HOLMIUM LASER/STENT PLACEMENT
Anesthesia: General | Laterality: Right

## 2021-12-18 MED ORDER — MIDAZOLAM HCL 2 MG/2ML IJ SOLN
INTRAMUSCULAR | Status: AC | PRN
  Administered 2021-12-18: 1 mg via INTRAVENOUS

## 2021-12-18 MED ORDER — ATENOLOL 25 MG PO TABS
50.0000 mg | ORAL_TABLET | Freq: Every day | ORAL | Status: DC
  Administered 2021-12-18 – 2021-12-23 (×6): 50 mg via ORAL
  Filled 2021-12-18 (×6): qty 2

## 2021-12-18 MED ORDER — FENTANYL CITRATE (PF) 100 MCG/2ML IJ SOLN
INTRAMUSCULAR | Status: AC | PRN
  Administered 2021-12-18: 25 ug via INTRAVENOUS

## 2021-12-18 MED ORDER — ONDANSETRON HCL 4 MG/2ML IJ SOLN
4.0000 mg | Freq: Once | INTRAMUSCULAR | Status: AC
  Administered 2021-12-18: 4 mg via INTRAVENOUS
  Filled 2021-12-18: qty 2

## 2021-12-18 MED ORDER — ATORVASTATIN CALCIUM 20 MG PO TABS
20.0000 mg | ORAL_TABLET | Freq: Every day | ORAL | Status: DC
  Administered 2021-12-18 – 2021-12-23 (×6): 20 mg via ORAL
  Filled 2021-12-18 (×6): qty 1

## 2021-12-18 MED ORDER — MAGNESIUM HYDROXIDE 400 MG/5ML PO SUSP: 30.0000 mL | Freq: Every day | ORAL | Status: DC | PRN

## 2021-12-18 MED ORDER — ACETAMINOPHEN 650 MG RE SUPP: 650.0000 mg | Freq: Four times a day (QID) | RECTAL | Status: DC | PRN

## 2021-12-18 MED ORDER — SODIUM CHLORIDE 0.9 % IV SOLN
2.0000 g | INTRAVENOUS | Status: DC
  Administered 2021-12-18 – 2021-12-19 (×2): 2 g via INTRAVENOUS
  Filled 2021-12-18 (×2): qty 2

## 2021-12-18 MED ORDER — LIDOCAINE HCL 1 % IJ SOLN
INTRAMUSCULAR | Status: AC
  Administered 2021-12-18: 3 mL
  Filled 2021-12-18: qty 20

## 2021-12-18 MED ORDER — ENOXAPARIN SODIUM 40 MG/0.4ML IJ SOSY: 40.0000 mg | PREFILLED_SYRINGE | INTRAMUSCULAR | Status: DC

## 2021-12-18 MED ORDER — ACETAMINOPHEN 325 MG PO TABS
650.0000 mg | ORAL_TABLET | Freq: Four times a day (QID) | ORAL | Status: DC | PRN
  Administered 2021-12-18 – 2021-12-23 (×11): 650 mg via ORAL
  Filled 2021-12-18 (×12): qty 2

## 2021-12-18 MED ORDER — SODIUM CHLORIDE 0.9 % IV SOLN
2.0000 g | Freq: Once | INTRAVENOUS | Status: AC
  Administered 2021-12-18: 2 g via INTRAVENOUS
  Filled 2021-12-18: qty 20

## 2021-12-18 MED ORDER — FENTANYL CITRATE (PF) 100 MCG/2ML IJ SOLN
INTRAMUSCULAR | Status: AC
  Filled 2021-12-18: qty 2

## 2021-12-18 MED ORDER — SODIUM CHLORIDE 0.9 % IV BOLUS
1000.0000 mL | Freq: Once | INTRAVENOUS | Status: AC
  Administered 2021-12-18: 1000 mL via INTRAVENOUS

## 2021-12-18 MED ORDER — MORPHINE SULFATE (PF) 2 MG/ML IV SOLN
2.0000 mg | INTRAVENOUS | Status: DC | PRN
  Administered 2021-12-18 – 2021-12-20 (×3): 2 mg via INTRAVENOUS
  Filled 2021-12-18 (×4): qty 1

## 2021-12-18 MED ORDER — PANTOPRAZOLE SODIUM 40 MG PO TBEC
40.0000 mg | DELAYED_RELEASE_TABLET | Freq: Every day | ORAL | Status: DC
  Administered 2021-12-18 – 2021-12-23 (×6): 40 mg via ORAL
  Filled 2021-12-18 (×6): qty 1

## 2021-12-18 MED ORDER — ONDANSETRON HCL 4 MG/2ML IJ SOLN
4.0000 mg | Freq: Four times a day (QID) | INTRAMUSCULAR | Status: DC | PRN
  Administered 2021-12-18 – 2021-12-20 (×2): 4 mg via INTRAVENOUS
  Filled 2021-12-18 (×2): qty 2

## 2021-12-18 MED ORDER — IOHEXOL 350 MG/ML SOLN
5.0000 mL | Freq: Once | INTRAVENOUS | Status: AC | PRN
Start: 2021-12-18 — End: 2021-12-18
  Administered 2021-12-18: 5 mL

## 2021-12-18 MED ORDER — SODIUM CHLORIDE 0.9 % IV SOLN: INTRAVENOUS | Status: DC

## 2021-12-18 MED ORDER — ONDANSETRON HCL 4 MG PO TABS
4.0000 mg | ORAL_TABLET | Freq: Four times a day (QID) | ORAL | Status: DC | PRN
  Filled 2021-12-18: qty 1

## 2021-12-18 MED ORDER — MIDAZOLAM HCL 2 MG/2ML IJ SOLN
INTRAMUSCULAR | Status: AC
  Filled 2021-12-18: qty 2

## 2021-12-18 MED ORDER — DOCUSATE SODIUM 100 MG PO CAPS
100.0000 mg | ORAL_CAPSULE | Freq: Two times a day (BID) | ORAL | Status: DC
  Administered 2021-12-18 – 2021-12-23 (×11): 100 mg via ORAL
  Filled 2021-12-18 (×11): qty 1

## 2021-12-18 MED ORDER — TRAZODONE HCL 50 MG PO TABS: 25.0000 mg | ORAL_TABLET | Freq: Every evening | ORAL | Status: DC | PRN

## 2021-12-18 MED ORDER — MORPHINE SULFATE (PF) 2 MG/ML IV SOLN
2.0000 mg | Freq: Once | INTRAVENOUS | Status: AC
  Administered 2021-12-18: 2 mg via INTRAVENOUS
  Filled 2021-12-18: qty 1

## 2021-12-18 SURGICAL SUPPLY — 26 items
BAG DRAIN CYSTO-URO LG1000N (MISCELLANEOUS) ×2 IMPLANT
BASKET ZERO TIP 1.9FR (BASKET) IMPLANT
BRUSH SCRUB EZ 1% IODOPHOR (MISCELLANEOUS) ×2 IMPLANT
CATH URET FLEX-TIP 2 LUMEN 10F (CATHETERS) IMPLANT
CATH URETL OPEN END 6X70 (CATHETERS) IMPLANT
CNTNR SPEC 2.5X3XGRAD LEK (MISCELLANEOUS)
CONT SPEC 4OZ STER OR WHT (MISCELLANEOUS)
CONTAINER SPEC 2.5X3XGRAD LEK (MISCELLANEOUS) IMPLANT
DRAPE UTILITY 15X26 TOWEL STRL (DRAPES) ×2 IMPLANT
GLOVE SURG UNDER POLY LF SZ7.5 (GLOVE) ×2 IMPLANT
GOWN STRL REUS W/ TWL LRG LVL3 (GOWN DISPOSABLE) ×1 IMPLANT
GOWN STRL REUS W/ TWL XL LVL3 (GOWN DISPOSABLE) ×1 IMPLANT
GOWN STRL REUS W/TWL LRG LVL3 (GOWN DISPOSABLE) ×1
GOWN STRL REUS W/TWL XL LVL3 (GOWN DISPOSABLE) ×1
GUIDEWIRE STR DUAL SENSOR (WIRE) ×2 IMPLANT
IV NS IRRIG 3000ML ARTHROMATIC (IV SOLUTION) ×2 IMPLANT
KIT TURNOVER CYSTO (KITS) ×2 IMPLANT
PACK CYSTO AR (MISCELLANEOUS) ×2 IMPLANT
SET CYSTO W/LG BORE CLAMP LF (SET/KITS/TRAYS/PACK) ×2 IMPLANT
SHEATH NAVIGATOR HD 12/14X36 (SHEATH) IMPLANT
STENT URET 6FRX24 CONTOUR (STENTS) IMPLANT
STENT URET 6FRX26 CONTOUR (STENTS) IMPLANT
SURGILUBE 2OZ TUBE FLIPTOP (MISCELLANEOUS) ×2 IMPLANT
TRACTIP FLEXIVA PULSE ID 200 (Laser) ×2 IMPLANT
VALVE UROSEAL ADJ ENDO (VALVE) IMPLANT
WATER STERILE IRR 500ML POUR (IV SOLUTION) ×2 IMPLANT

## 2021-12-18 NOTE — Consult Note (Signed)
Chief Complaint: Patient was seen in consultation today for  Chief Complaint  Patient presents with   Abdominal Pain   Dysuria   at the request of Urology  Referring Physician(s): Debroah Loop, [place]  Supervising Physician: Jacqulynn Cadet  Patient Status: [name] - In-pt  History of Present Illness: Lindsay Dean is a 56 y.o. female with PMH diabetes, current stone disease, and recurrent urosepsis s/p right ureteral reimplant who presented to the ED yesterday with reports of 1 week of dysuria, lower abdominal pain, bilateral flank pain, and new fevers.  CT stone study revealed right hydronephrosis and perinephric stranding secondary to an obstructing 3 mm right UVJ stone as well as nonobstructing left nephrolithiasis.  Admission labs notable for creatinine 1.31; WBC count 11.4; lactate 1.6; and UA with no nitrites, 21-50 RBCs/hpf, >50 WBCs/hpf, and WBC clumps.  She has been febrile, Tmax 102.2 F, but is normotensive and HR remains WNL.  Urine culture pending, on antibiotics.  IR consulted for placement of right percutaneous nephrostomy   Past Medical History:  Diagnosis Date   Diabetes mellitus without complication ([place])    taken off meds in 2017   Hypertension    Lupus ([place])     Past Surgical History:  Procedure Laterality Date   ABDOMINAL HYSTERECTOMY     CYSTOSCOPY W/ RETROGRADES Left 10/07/2021   Procedure: CYSTOSCOPY WITH RETROGRADE PYELOGRAM;  Surgeon: Abbie Sons, MD;  Location: [facility];  Service: Urology;  Laterality: Left;   CYSTOSCOPY WITH STENT PLACEMENT Right 05/04/2019   Procedure: CYSTOSCOPY with attempt of stent placement;  Surgeon: Billey Co, MD;  Location: [facility];  Service: Urology;  Laterality: Right;   CYSTOSCOPY/URETEROSCOPY/HOLMIUM LASER/STENT PLACEMENT Left 10/07/2021   Procedure: CYSTOSCOPY/URETEROSCOPY/HOLMIUM LASER/STENT PLACEMENT;  Surgeon: Abbie Sons, MD;  Location: [facility];  Service: Urology;  Laterality:  Left;    Allergies: Tramadol  Medications: Prior to Admission medications   Medication Sig Start Date End Date Taking? Authorizing Provider  atenolol (TENORMIN) 50 MG tablet Take 50 mg by mouth daily.    [provider]  atorvastatin (LIPITOR) 20 MG tablet Take 20 mg by mouth daily.    [provider]  ciprofloxacin (CIPRO) 500 MG tablet Take 500 mg by mouth 2 (two) times daily. 12/15/21   [provider]  docusate sodium (COLACE) 100 MG capsule Take 1 capsule (100 mg total) by mouth 2 (two) times daily. 11/07/21   Sidney Ace, MD  hydrochlorothiazide (HYDRODIURIL) 25 MG tablet Take 25 mg by mouth daily. 08/27/21   [provider]  losartan (COZAAR) 100 MG tablet Take 100 mg by mouth daily. 07/25/21   [provider]  omeprazole (PRILOSEC) 20 MG capsule Take 20 mg by mouth daily. 07/25/21   [provider]     Family History  Problem Relation Age of Onset   Diabetes Mother    Heart disease Mother    Hypertension Mother    Arthritis Mother    Hypertension Father    Lung disease Father     Social History   Socioeconomic History   Marital status: Married    Spouse name: Not on file   Number of children: Not on file   Years of education: Not on file   Highest education level: Not on file  Occupational History   Not on file  Tobacco Use   Smoking status: Never   Smokeless tobacco: Never  Vaping Use   Vaping Use: Never used  Substance and Sexual Activity  Alcohol use: No   Drug use: No   Sexual activity: Not on file  Other Topics Concern   Not on file  Social History Narrative   Not on file   Social Determinants of Health   Financial Resource Strain: Not on file  Food Insecurity: Not on file  Transportation Needs: Not on file  Physical Activity: Not on file  Stress: Not on file  Social Connections: Not on file    Review of Systems: A 12 point ROS discussed and pertinent positives are indicated in the HPI  above.  All other systems are negative.  Review of Systems  Constitutional:  Positive for activity change, appetite change and fever.  HENT: Negative.    Eyes: Negative.   Respiratory: Negative.    Cardiovascular: Negative.   Endocrine: Negative.   Genitourinary:  Positive for difficulty urinating, dysuria, flank pain, frequency, pelvic pain and urgency.  Musculoskeletal:  Positive for back pain.  Skin: Negative.   Allergic/Immunologic: Negative.   Neurological: Negative.   Hematological: Negative.   Psychiatric/Behavioral: Negative.      Vital Signs: BP 102/71 (BP Location: Right Arm)   Pulse 85   Temp 99.1 F (37.3 C) (Oral)   Resp 18   SpO2 94%     Physical Exam Constitutional:      General: She is in acute distress.     Comments: Rocking back and forth, grimacing in pain  HENT:     Head: Normocephalic and atraumatic.     Mouth/Throat:     Mouth: Mucous membranes are moist.     Pharynx: Oropharynx is clear.  Eyes:     Extraocular Movements: Extraocular movements intact.  Cardiovascular:     Rate and Rhythm: Normal rate.  Pulmonary:     Effort: Pulmonary effort is normal.  Abdominal:     Palpations: Abdomen is soft.     Tenderness: There is generalized abdominal tenderness and tenderness in the suprapubic area.  Skin:    General: Skin is warm and dry.     Capillary Refill: Capillary refill takes less than 2 seconds.  Neurological:     General: No focal deficit present.     Mental Status: She is alert and oriented to person, place, and time.  Psychiatric:        Mood and Affect: Mood normal.        Behavior: Behavior normal.     Imaging: CT Renal Stone Study  Result Date: 12/18/2021 CLINICAL DATA:  Nausea, dysuria, flank pain EXAM: CT ABDOMEN AND PELVIS WITHOUT CONTRAST TECHNIQUE: Multidetector CT imaging of the abdomen and pelvis was performed following the standard protocol without IV contrast. RADIATION DOSE REDUCTION: This exam was performed according  to the departmental dose-optimization program which includes automated exposure control, adjustment of the mA and/or kV according to patient size and/or use of iterative reconstruction technique. COMPARISON:  11/04/2021 FINDINGS: Lower chest: The visualized lung bases are clear. The visualized heart pericardium are unremarkable. Small hiatal hernia. Hepatobiliary: No focal liver abnormality is seen. Status post cholecystectomy. No biliary dilatation. Pancreas: Unremarkable Spleen: Unremarkable Adrenals/Urinary Tract: The adrenal glands are unremarkable. The kidneys are normal in size and position. Previously noted 3 mm calculus within the lower pole the right kidney has now migrated into the right ureterovesicular junction and resultant obstruction with moderate right hydronephrosis and perinephric stranding. No superimposed residual nephrolithiasis. No perinephric fluid collections are identified. Left double-J ureteral stent has been removed. Punctate 1-2 mm nonobstructing calculi are again seen within the  upper pole the left kidney, unchanged. No hydronephrosis or ureteral calculi on the left. The bladder is unremarkable. Stomach/Bowel: Moderate to severe pancolonic diverticulosis without superimposed acute inflammatory change. Stomach, small bowel, and large bowel are otherwise unremarkable. No evidence of obstruction or focal inflammation. Appendix normal. No free intraperitoneal gas or fluid. Vascular/Lymphatic: No significant vascular findings are present within the abdomen and pelvis. There is extensive arteriosclerosis of the visualized lower extremity arterial outflow, not well assessed on this noncontrast examination. No aortic aneurysm. No enlarged abdominal or pelvic lymph nodes. Reproductive: Status post hysterectomy. No adnexal masses. Other: No abdominal wall hernia. Musculoskeletal: Stable grade 2 anterolisthesis L4-5 with associated degenerative changes at the lumbosacral junction. No acute bone  abnormality. IMPRESSION: 1. Obstructing 3 mm calculus at the right ureterovesicular junction resulting in moderate right hydronephrosis and perinephric stranding. 2. Minimal nonobstructing left nephrolithiasis. Interval removal of left double-J ureteral stent. 3. Moderate to severe pancolonic diverticulosis without superimposed acute inflammatory change. Electronically Signed   By: Helyn Numbers M.D.   On: 12/18/2021 00:47    Labs:  CBC: Recent Labs    11/05/21 0540 11/06/21 0743 12/17/21 2146 12/18/21 0322  WBC 2.5* 2.6* 11.4* 9.7  HGB 9.6* 9.1* 11.7* 10.9*  HCT 29.0* 28.1* 35.9* 34.3*  PLT 118* 114* 199 158    COAGS: No results for input(s): "INR", "APTT" in the last 8760 hours.  BMP: Recent Labs    11/05/21 0540 11/06/21 0743 12/17/21 2146 12/18/21 0322  NA 134* 136 136 138  K 4.5 3.9 4.0 4.4  CL 111 113* 108 111  CO2 16* 17* 19* 22  GLUCOSE 86 100* 185* 147*  BUN 45* 32* 26* 25*  CALCIUM 8.5* 8.1* 9.0 7.9*  CREATININE 1.58* 1.15* 1.31* 1.44*  GFRNONAA 39* 57* 49* 43*    LIVER FUNCTION TESTS: Recent Labs    10/06/21 0814 11/04/21 1042 11/05/21 0540 12/17/21 2146  BILITOT 0.7 0.5 0.2* 0.5  AST 26 32 84* 27  ALT 18 31 72* 21  ALKPHOS 85 94 100 95  PROT 8.9* 9.5* 7.5 9.0*  ALBUMIN 3.5 3.5 2.8* 3.6    Assessment and Plan:  Right hydronephrosis with obstructing stone --prior failed stent placment --IR contacted for R nephrostomy --Patient is NPO and wants to proceed --plan for this afternoon.  Risks and benefits of right PCN placement was discussed with the patient including, but not limited to, infection, bleeding, significant bleeding causing loss or decrease in renal function or damage to adjacent structures.   All of the patient's questions were answered, patient is agreeable to proceed.  Consent signed and in chart.    Thank you for this interesting consult.  I greatly enjoyed meeting Terease Luceil Herrin and look forward to participating in  their care.  A copy of this report was sent to the requesting provider on this date.  Electronically Signed: Sheliah Plane, PA 12/18/2021, 11:45 AM   I spent a total of 20 Minutes in face to face in clinical consultation, greater than 50% of which was counseling/coordinating care for hydronephrosis.

## 2021-12-18 NOTE — Assessment & Plan Note (Signed)
-   We will continue PPI therapy 

## 2021-12-18 NOTE — Progress Notes (Signed)
IR to place right PCN later today.  Order placed.  Please continue to keep her n.p.o. and do not administer scheduled Lovenox in advance of procedure.

## 2021-12-18 NOTE — Plan of Care (Signed)

## 2021-12-18 NOTE — ED Provider Notes (Signed)
[facility] Provider Note    Event Date/Time   First MD Initiated Contact with Patient 12/18/21 0005     (approximate)   History   Abdominal Pain and Dysuria  History obtained via tele-Spanish interpreter  HPI  Lindsay Dean is a 56 y.o. female brought to the ED via EMS from home for dysuria and lower abdominal pain which began approximately 3 to 5 days ago.  Symptoms associated with nausea only.  Developed fever tonight.  Denies cough, chest pain, shortness of breath, flank pain, hematuria, vomiting or diarrhea.  History of kidney stones with recent stenting in May 2023.    Past Medical History   Past Medical History:  Diagnosis Date   Diabetes mellitus without complication ([place])    taken off meds in 2017   Hypertension    Lupus [facility])      Active Problem List   Patient Active Problem List   Diagnosis Date Noted   Pyelonephritis 11/05/2021   Sepsis secondary to UTI ([place]) 11/04/2021   UTI (urinary tract infection) 11/04/2021   Hyponatremia 11/04/2021   AKI (acute kidney injury) ([place]) 11/04/2021   Ureterolithiasis 10/06/2021   Diabetes mellitus without complication ([place])    Hypertension 10/28/2020   Obesity 10/28/2020   PAD (peripheral artery disease) ([place]) 10/28/2020   Sepsis ([place]) 05/04/2019   Cystocele with rectocele 05/27/2018   Prolapsed, uterovaginal, incomplete 05/27/2018   Sleep apnea 05/27/2018   Spondylolisthesis 05/27/2018   SUI (stress urinary incontinence, female) 05/27/2018   Back pain 03/05/2014   Lupus ([place]) 03/05/2014   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     Past Surgical History   Past Surgical History:  Procedure Laterality Date   ABDOMINAL HYSTERECTOMY     CYSTOSCOPY W/ RETROGRADES Left 10/07/2021   Procedure: CYSTOSCOPY WITH RETROGRADE PYELOGRAM;  Surgeon: Abbie Sons, MD;  Location: [facility];  Service: Urology;  Laterality: Left;   CYSTOSCOPY WITH STENT PLACEMENT Right 05/04/2019   Procedure:  CYSTOSCOPY with attempt of stent placement;  Surgeon: Billey Co, MD;  Location: [facility];  Service: Urology;  Laterality: Right;   CYSTOSCOPY/URETEROSCOPY/HOLMIUM LASER/STENT PLACEMENT Left 10/07/2021   Procedure: CYSTOSCOPY/URETEROSCOPY/HOLMIUM LASER/STENT PLACEMENT;  Surgeon: Abbie Sons, MD;  Location: [facility];  Service: Urology;  Laterality: Left;     Home Medications   Prior to Admission medications   Medication Sig Start Date End Date Taking? Authorizing Provider  atenolol (TENORMIN) 50 MG tablet Take 50 mg by mouth daily.    [provider]  atorvastatin (LIPITOR) 20 MG tablet Take 20 mg by mouth daily.    [provider]  docusate sodium (COLACE) 100 MG capsule Take 1 capsule (100 mg total) by mouth 2 (two) times daily. 11/07/21   Sidney Ace, MD  hydrochlorothiazide (HYDRODIURIL) 25 MG tablet Take 25 mg by mouth daily. 08/27/21   [provider]  losartan (COZAAR) 100 MG tablet Take 100 mg by mouth daily. 07/25/21   [provider]  omeprazole (PRILOSEC) 20 MG capsule Take 20 mg by mouth daily. 07/25/21   [provider]     Allergies  Tramadol   Family History   Family History  Problem Relation Age of Onset   Diabetes Mother    Heart disease Mother    Hypertension Mother    Arthritis Mother    Hypertension Father    Lung disease Father      Physical Exam  Triage Vital Signs: ED Triage Vitals [12/17/21 2138]  Enc Vitals  Group     BP (!) 143/81     Pulse Rate 86     Resp 20     Temp (!) 102.2 F (39 C)     Temp Source Oral     SpO2 96 %     Weight      Height      Head Circumference      Peak Flow      Pain Score      Pain Loc      Pain Edu?      Excl. in [place]?     Updated Vital Signs: BP 115/68   Pulse 84   Temp (!) 102.2 F (39 C) (Oral)   Resp 17   SpO2 99%    General: Awake, no distress.  CV:  RRR.  Good peripheral perfusion.  Resp:  Normal effort.  CTA B. Abd:  Mild suprapubic  tenderness to palpation without rebound or guarding.  No distention.  No CVAT. Other:  No truncal vesicles.   ED Results / Procedures / Treatments  Labs (all labs ordered are listed, but only abnormal results are displayed) Labs Reviewed  COMPREHENSIVE METABOLIC PANEL - Abnormal; Notable for the following components:      Result Value   CO2 19 (*)    Glucose, Bld 185 (*)    BUN 26 (*)    Creatinine, Ser 1.31 (*)    Total Protein 9.0 (*)    GFR, Estimated 49 (*)    All other components within normal limits  CBC - Abnormal; Notable for the following components:   WBC 11.4 (*)    Hemoglobin 11.7 (*)    HCT 35.9 (*)    All other components within normal limits  URINALYSIS, ROUTINE W REFLEX MICROSCOPIC - Abnormal; Notable for the following components:   Color, Urine YELLOW (*)    APPearance TURBID (*)    Hgb urine dipstick MODERATE (*)    Protein, ur 100 (*)    Leukocytes,Ua LARGE (*)    WBC, UA >50 (*)    All other components within normal limits  CULTURE, BLOOD (SINGLE)  URINE CULTURE  CULTURE, BLOOD (SINGLE)  LIPASE, BLOOD  LACTIC ACID, PLASMA  PROCALCITONIN     EKG  None   RADIOLOGY I have independently visualized and interpreted patient's CT scan as well as noted the radiology interpretation:  CT renal stone study: Obstructing 3 mm right UVJ calculus resulting in moderate right hydronephrosis and perinephric stranding  Official radiology report(s): CT Renal Stone Study  Result Date: 12/18/2021 CLINICAL DATA:  Nausea, dysuria, flank pain EXAM: CT ABDOMEN AND PELVIS WITHOUT CONTRAST TECHNIQUE: Multidetector CT imaging of the abdomen and pelvis was performed following the standard protocol without IV contrast. RADIATION DOSE REDUCTION: This exam was performed according to the departmental dose-optimization program which includes automated exposure control, adjustment of the mA and/or kV according to patient size and/or use of iterative reconstruction technique.  COMPARISON:  11/04/2021 FINDINGS: Lower chest: The visualized lung bases are clear. The visualized heart pericardium are unremarkable. Small hiatal hernia. Hepatobiliary: No focal liver abnormality is seen. Status post cholecystectomy. No biliary dilatation. Pancreas: Unremarkable Spleen: Unremarkable Adrenals/Urinary Tract: The adrenal glands are unremarkable. The kidneys are normal in size and position. Previously noted 3 mm calculus within the lower pole the right kidney has now migrated into the right ureterovesicular junction and resultant obstruction with moderate right hydronephrosis and perinephric stranding. No superimposed residual nephrolithiasis. No perinephric fluid collections are identified. Left double-J  ureteral stent has been removed. Punctate 1-2 mm nonobstructing calculi are again seen within the upper pole the left kidney, unchanged. No hydronephrosis or ureteral calculi on the left. The bladder is unremarkable. Stomach/Bowel: Moderate to severe pancolonic diverticulosis without superimposed acute inflammatory change. Stomach, small bowel, and large bowel are otherwise unremarkable. No evidence of obstruction or focal inflammation. Appendix normal. No free intraperitoneal gas or fluid. Vascular/Lymphatic: No significant vascular findings are present within the abdomen and pelvis. There is extensive arteriosclerosis of the visualized lower extremity arterial outflow, not well assessed on this noncontrast examination. No aortic aneurysm. No enlarged abdominal or pelvic lymph nodes. Reproductive: Status post hysterectomy. No adnexal masses. Other: No abdominal wall hernia. Musculoskeletal: Stable grade 2 anterolisthesis L4-5 with associated degenerative changes at the lumbosacral junction. No acute bone abnormality. IMPRESSION: 1. Obstructing 3 mm calculus at the right ureterovesicular junction resulting in moderate right hydronephrosis and perinephric stranding. 2. Minimal nonobstructing left  nephrolithiasis. Interval removal of left double-J ureteral stent. 3. Moderate to severe pancolonic diverticulosis without superimposed acute inflammatory change. Electronically Signed   By: Helyn Numbers M.D.   On: 12/18/2021 00:47     PROCEDURES:  Critical Care performed: Yes, see critical care procedure note(s)  CRITICAL CARE Performed by: Irean Hong   Total critical care time: 30 minutes  Critical care time was exclusive of separately billable procedures and treating other patients.  Critical care was necessary to treat or prevent imminent or life-threatening deterioration.  Critical care was time spent personally by me on the following activities: development of treatment plan with patient and/or surrogate as well as nursing, discussions with consultants, evaluation of patient's response to treatment, examination of patient, obtaining history from patient or surrogate, ordering and performing treatments and interventions, ordering and review of laboratory studies, ordering and review of radiographic studies, pulse oximetry and re-evaluation of patient's condition.   Procedures   MEDICATIONS ORDERED IN ED: Medications  acetaminophen (TYLENOL) tablet 650 mg (650 mg Oral Given 12/17/21 2146)  sodium chloride 0.9 % bolus 1,000 mL (1,000 mLs Intravenous New Bag/Given 12/18/21 0049)  ondansetron (ZOFRAN) injection 4 mg (4 mg Intravenous Given 12/18/21 0050)  morphine (PF) 2 MG/ML injection 2 mg (2 mg Intravenous Given 12/18/21 0051)  cefTRIAXone (ROCEPHIN) 2 g in sodium chloride 0.9 % 100 mL IVPB (0 g Intravenous Stopped 12/18/21 0138)     IMPRESSION / MDM / ASSESSMENT AND PLAN / ED COURSE  I reviewed the triage vital signs and the nursing notes.                             56 year old female presenting with suprapubic abdominal pain, dysuria and fever. Differential diagnosis includes, but is not limited to, ovarian cyst, ovarian torsion, acute appendicitis, diverticulitis, urinary  tract infection/pyelonephritis, endometriosis, bowel obstruction, colitis, renal colic, gastroenteritis, hernia, etc. I have personally reviewed patient's records and note she had a hospitalization 4/17-4/19/2023 for left ureteral lithiasis status post left ureteral stent placement and 6 mm stone removal plus acute cystitis.  She was hospitalized again 5/16-5/19/2023 for UTI with soft blood pressures, nausea and vomiting.  Patient had flexible cystoscopy with stent removal on 11/14/2021.  Patient's presentation is most consistent with acute complicated illness / injury requiring diagnostic workup.  The patient is on the cardiac monitor to evaluate for evidence of arrhythmia and/or significant heart rate changes.  Laboratory results demonstrate mild leukocytosis WBC 11.4, mild AKI compared to prior with creatinine 1.31 (previous  1.15), UA with large leukocytes, greater than 50 WBC, 21-50 RBC.  Lactic acid 1.6.  Will initiate IV fluid hydration, IV morphine for pain paired with IV Zofran for nausea.  Initiate treatment with 2 g IV Rocephin.  Given patient's prior history of ureteral colic requiring stenting, will proceed with CT renal colic study.  Clinical Course as of 12/18/21 0212  Thu Dec 18, 2021  0123 Repeat oral temperature 99.6 F [JS]  0153 Procalcitonin is negative.  Will discuss case with urology on-call Dr. Lonna Cobb. [JS]  0201 Spoke with Dr. Lonna Cobb who does not think patient requires urgent stenting but recommends admission and keeping patient n.p.o.  We will consult hospitalist services for evaluation and admission. [JS]    Clinical Course User Index [JS] Irean Hong, MD     FINAL CLINICAL IMPRESSION(S) / ED DIAGNOSES   Final diagnoses:  Fever, unspecified fever cause  Lower urinary tract infectious disease  Lower abdominal pain  AKI (acute kidney injury) ([facility])  SIRS (systemic inflammatory response syndrome) ([facility])     Rx / DC Orders   ED Discharge Orders     None         Note:  This document was prepared using Dragon voice recognition software and may include unintentional dictation errors.   Irean Hong, MD 12/18/21 [phone]

## 2021-12-18 NOTE — ED Notes (Signed)
Spanish interpreter , Lindsay Dean [phone].Marland Kitchen confirmed  DNR  wishes for this Pt

## 2021-12-18 NOTE — Consult Note (Signed)
Urology Consult  Requesting physician: Valente David, MD  Reason for consultation: Obstructing ureteral calculus with infection  Chief Complaint: Abdominal pain  History of Present Illness: Lindsay Dean is a 56 y.o. female with a prior history of recurrent ureteral calculi and UTI.  She is status post ureteroscopic removal of a left distal ureteral calculus May 2023.  She has a history of a right ureteral calculus November 2020 and attempt at right ureteral stent placement was unsuccessful due to inability to identify the right ureteral orifice.  She has history of prior cystotomy and right ureteral injury during transvaginal hysterectomy/vaginal vault suspension in December 2019 that required a right ureteral reimplant.  She subsequently had a right nephrostomy tube placed.    Past Medical History:  Diagnosis Date   Diabetes mellitus without complication (HCC)    taken off meds in 2017   Hypertension    Lupus [facility])     Past Surgical History:  Procedure Laterality Date   ABDOMINAL HYSTERECTOMY     CYSTOSCOPY W/ RETROGRADES Left 10/07/2021   Procedure: CYSTOSCOPY WITH RETROGRADE PYELOGRAM;  Surgeon: Riki Altes, MD;  Location: [facility] ORS;  Service: Urology;  Laterality: Left;   CYSTOSCOPY WITH STENT PLACEMENT Right 05/04/2019   Procedure: CYSTOSCOPY with attempt of stent placement;  Surgeon: Sondra Come, MD;  Location: [facility] ORS;  Service: Urology;  Laterality: Right;   CYSTOSCOPY/URETEROSCOPY/HOLMIUM LASER/STENT PLACEMENT Left 10/07/2021   Procedure: CYSTOSCOPY/URETEROSCOPY/HOLMIUM LASER/STENT PLACEMENT;  Surgeon: Riki Altes, MD;  Location: [facility] ORS;  Service: Urology;  Laterality: Left;    Home Medications:  No outpatient medications have been marked as taking for the 12/17/21 encounter [facility] Encounter).    Allergies:  Allergies  Allergen Reactions   Tramadol Nausea Only    Other reaction(s): Dizziness    Family History  Problem Relation Age of Onset    Diabetes Mother    Heart disease Mother    Hypertension Mother    Arthritis Mother    Hypertension Father    Lung disease Father     Social History:  reports that she has never smoked. She has never used smokeless tobacco. She reports that she does not drink alcohol and does not use drugs.  ROS: A complete review of systems was performed.  All systems are negative except for pertinent findings as noted.  Physical Exam:  Vital signs in last 24 hours: Temp:  [99 F (37.2 C)-102.2 F (39 C)] 100.4 F (38 C) (06/29 0446) Pulse Rate:  [79-86] 86 (06/29 0446) Resp:  [16-20] 17 (06/29 0446) BP: (100-143)/(61-85) 140/85 (06/29 0446) SpO2:  [96 %-100 %] 98 % (06/29 0446) Constitutional:  Alert and oriented, No acute distress HEENT: [place] AT, moist mucus membranes.  Trachea midline, no masses Cardiovascular: Regular rate and rhythm, no clubbing, cyanosis, or edema. Respiratory: Normal respiratory effort, lungs clear bilaterally GI: Abdomen is soft, nontender, nondistended, no abdominal masses GU: No CVA tenderness Skin: No rashes, bruises or suspicious lesions Lymph: No cervical or inguinal adenopathy Neurologic: Grossly intact, no focal deficits, moving all 4 extremities Psychiatric: Normal mood and affect   Laboratory Data:  Recent Labs    12/17/21 2146 12/18/21 0322  WBC 11.4* 9.7  HGB 11.7* 10.9*  HCT 35.9* 34.3*   Recent Labs    12/17/21 2146 12/18/21 0322  NA 136 138  K 4.0 4.4  CL 108 111  CO2 19* 22  GLUCOSE 185* 147*  BUN 26* 25*  CREATININE 1.31* 1.44*  CALCIUM 9.0 7.9*  No results for input(s): "LABPT", "INR" in the last 72 hours. No results for input(s): "LABURIN" in the last 72 hours. Results for orders placed or performed in visit on 11/14/21  Microscopic Examination     Status: Abnormal   Collection Time: 11/14/21  9:35 AM   Urine  Result Value Ref Range Status   WBC, UA >30 (A) 0 - 5 /hpf Final   RBC, Urine >30 (A) 0 - 2 /hpf Final   Epithelial  Cells (non renal) 0-10 0 - 10 /hpf Final   Bacteria, UA Few None seen/Few Final     Radiologic Imaging: CT Renal Stone Study  Result Date: 12/18/2021 CLINICAL DATA:  Nausea, dysuria, flank pain EXAM: CT ABDOMEN AND PELVIS WITHOUT CONTRAST TECHNIQUE: Multidetector CT imaging of the abdomen and pelvis was performed following the standard protocol without IV contrast. RADIATION DOSE REDUCTION: This exam was performed according to the departmental dose-optimization program which includes automated exposure control, adjustment of the mA and/or kV according to patient size and/or use of iterative reconstruction technique. COMPARISON:  11/04/2021 FINDINGS: Lower chest: The visualized lung bases are clear. The visualized heart pericardium are unremarkable. Small hiatal hernia. Hepatobiliary: No focal liver abnormality is seen. Status post cholecystectomy. No biliary dilatation. Pancreas: Unremarkable Spleen: Unremarkable Adrenals/Urinary Tract: The adrenal glands are unremarkable. The kidneys are normal in size and position. Previously noted 3 mm calculus within the lower pole the right kidney has now migrated into the right ureterovesicular junction and resultant obstruction with moderate right hydronephrosis and perinephric stranding. No superimposed residual nephrolithiasis. No perinephric fluid collections are identified. Left double-J ureteral stent has been removed. Punctate 1-2 mm nonobstructing calculi are again seen within the upper pole the left kidney, unchanged. No hydronephrosis or ureteral calculi on the left. The bladder is unremarkable. Stomach/Bowel: Moderate to severe pancolonic diverticulosis without superimposed acute inflammatory change. Stomach, small bowel, and large bowel are otherwise unremarkable. No evidence of obstruction or focal inflammation. Appendix normal. No free intraperitoneal gas or fluid. Vascular/Lymphatic: No significant vascular findings are present within the abdomen and  pelvis. There is extensive arteriosclerosis of the visualized lower extremity arterial outflow, not well assessed on this noncontrast examination. No aortic aneurysm. No enlarged abdominal or pelvic lymph nodes. Reproductive: Status post hysterectomy. No adnexal masses. Other: No abdominal wall hernia. Musculoskeletal: Stable grade 2 anterolisthesis L4-5 with associated degenerative changes at the lumbosacral junction. No acute bone abnormality. IMPRESSION: 1. Obstructing 3 mm calculus at the right ureterovesicular junction resulting in moderate right hydronephrosis and perinephric stranding. 2. Minimal nonobstructing left nephrolithiasis. Interval removal of left double-J ureteral stent. 3. Moderate to severe pancolonic diverticulosis without superimposed acute inflammatory change. Electronically Signed   By: Helyn Numbers M.D.   On: 12/18/2021 00:47    Impression/Assessment:  Duplicate note.  Refer to Sam Vaillancourt's, PA-C note     12/18/2021, 7:47 AM  Irineo Axon,  MD

## 2021-12-18 NOTE — Assessment & Plan Note (Signed)
-   We will continue atenolol and hold HCTZ and Cozaar given acute kidney injury.

## 2021-12-18 NOTE — Progress Notes (Signed)
Via Spanish video interpreter.  Patient came in with flank pain and fever along with some nausea. She was found to have right sided hydronephrosis with distal ureter pelvic junction stone. Patient was evaluated by urology Dr. Lonna Cobb and at this time recommendation is to do percutaneous nephrostomy tube placement by interventional radiology. Patient is in agreement.  Spoke with patient's son moises on the phone and explain the procedure.  Continue IV fluids, IV antibiotics and pain meds

## 2021-12-18 NOTE — Consult Note (Signed)
Urology Consult  I have been asked to see the patient by Dr. Arville Care, for evaluation and management of right hydronephrosis, right UPJ stone, febrile UTI.  Chief Complaint: Dysuria, lower abdominal pain, nausea, fevers  History of Present Illness: Lindsay Dean is a 56 y.o. year old female with PMH diabetes, current stone disease, and recurrent urosepsis s/p right ureteral reimplant who presented to the ED yesterday with reports of 1 week of dysuria, lower abdominal pain, bilateral flank pain, and new fevers.  CT stone study revealed right hydronephrosis and perinephric stranding secondary to an obstructing 3 mm right UVJ stone as well as nonobstructing left nephrolithiasis.  Admission labs notable for creatinine 1.31; WBC count 11.4; lactate 1.6; and UA with no nitrites, 21-50 RBCs/hpf, >50 WBCs/hpf, and WBC clumps.  She has been febrile, Tmax 102.2 F, but is normotensive and HR remains WNL.  Urine culture pending, on antibiotics as below  Today she reports that her dysuria and lower abdominal pain began approximately 8 days ago.  She subsequently developed bilateral flank pain and fevers began yesterday.  History today facilitated by video interpreter.  Anti-infectives (From admission, onward)    Start     Dose/Rate Route Frequency Ordered Stop   12/18/21 2200  cefTRIAXone (ROCEPHIN) 2 g in sodium chloride 0.9 % 100 mL IVPB        2 g 200 mL/hr over 30 Minutes Intravenous Every 24 hours 12/18/21 0228     12/18/21 0030  cefTRIAXone (ROCEPHIN) 2 g in sodium chloride 0.9 % 100 mL IVPB        2 g 200 mL/hr over 30 Minutes Intravenous  Once 12/18/21 0021 12/18/21 0138        Past Medical History:  Diagnosis Date   Diabetes mellitus without complication (HCC)    taken off meds in 2017   Hypertension    Lupus (HCC)     Past Surgical History:  Procedure Laterality Date   ABDOMINAL HYSTERECTOMY     CYSTOSCOPY W/ RETROGRADES Left 10/07/2021   Procedure: CYSTOSCOPY WITH  RETROGRADE PYELOGRAM;  Surgeon: Riki Altes, MD;  Location: [facility] ORS;  Service: Urology;  Laterality: Left;   CYSTOSCOPY WITH STENT PLACEMENT Right 05/04/2019   Procedure: CYSTOSCOPY with attempt of stent placement;  Surgeon: Sondra Come, MD;  Location: [facility] ORS;  Service: Urology;  Laterality: Right;   CYSTOSCOPY/URETEROSCOPY/HOLMIUM LASER/STENT PLACEMENT Left 10/07/2021   Procedure: CYSTOSCOPY/URETEROSCOPY/HOLMIUM LASER/STENT PLACEMENT;  Surgeon: Riki Altes, MD;  Location: [facility] ORS;  Service: Urology;  Laterality: Left;    Home Medications:  No outpatient medications have been marked as taking for the 12/17/21 encounter [facility] Encounter).    Allergies:  Allergies  Allergen Reactions   Tramadol Nausea Only    Other reaction(s): Dizziness    Family History  Problem Relation Age of Onset   Diabetes Mother    Heart disease Mother    Hypertension Mother    Arthritis Mother    Hypertension Father    Lung disease Father     Social History:  reports that she has never smoked. She has never used smokeless tobacco. She reports that she does not drink alcohol and does not use drugs.  ROS: A complete review of systems was performed.  All systems are negative except for pertinent findings as noted.  Physical Exam:  Vital signs in last 24 hours: Temp:  [99 F (37.2 C)-102.2 F (39 C)] 101.7 F (38.7 C) (06/29 0751) Pulse Rate:  [79-90] 90 (06/29 0751) Resp:  [  16-20] 18 (06/29 0751) BP: (100-143)/(61-85) 132/79 (06/29 0751) SpO2:  [96 %-100 %] 96 % (06/29 0751) Constitutional:  Alert and oriented, ill-appearing but nontoxic, tremulous HEENT: [place] AT, moist mucus membranes Cardiovascular: No clubbing, cyanosis, or edema Respiratory: Normal respiratory effort Skin: No rashes, bruises or suspicious lesions Neurologic: Grossly intact, no focal deficits, moving all 4 extremities Psychiatric: Normal mood and affect   Laboratory Data:  Recent Labs    12/17/21 2146  12/18/21 0322  WBC 11.4* 9.7  HGB 11.7* 10.9*  HCT 35.9* 34.3*   Recent Labs    12/17/21 2146 12/18/21 0322  NA 136 138  K 4.0 4.4  CL 108 111  CO2 19* 22  GLUCOSE 185* 147*  BUN 26* 25*  CREATININE 1.31* 1.44*  CALCIUM 9.0 7.9*   Urinalysis    Component Value Date/Time   COLORURINE YELLOW (A) 12/17/2021 2146   APPEARANCEUR TURBID (A) 12/17/2021 2146   APPEARANCEUR Hazy (A) 11/14/2021 0935   LABSPEC 1.013 12/17/2021 2146   PHURINE 5.0 12/17/2021 2146   GLUCOSEU NEGATIVE 12/17/2021 2146   HGBUR MODERATE (A) 12/17/2021 2146   BILIRUBINUR NEGATIVE 12/17/2021 2146   BILIRUBINUR Negative 11/14/2021 0935   KETONESUR NEGATIVE 12/17/2021 2146   PROTEINUR 100 (A) 12/17/2021 2146   NITRITE NEGATIVE 12/17/2021 2146   LEUKOCYTESUR LARGE (A) 12/17/2021 2146   Results for orders placed or performed in visit on 11/14/21  Microscopic Examination     Status: Abnormal   Collection Time: 11/14/21  9:35 AM   Urine  Result Value Ref Range Status   WBC, UA >30 (A) 0 - 5 /hpf Final   RBC, Urine >30 (A) 0 - 2 /hpf Final   Epithelial Cells (non renal) 0-10 0 - 10 /hpf Final   Bacteria, UA Few None seen/Few Final    Radiologic Imaging: CT Renal Stone Study  Result Date: 12/18/2021 CLINICAL DATA:  Nausea, dysuria, flank pain EXAM: CT ABDOMEN AND PELVIS WITHOUT CONTRAST TECHNIQUE: Multidetector CT imaging of the abdomen and pelvis was performed following the standard protocol without IV contrast. RADIATION DOSE REDUCTION: This exam was performed according to the departmental dose-optimization program which includes automated exposure control, adjustment of the mA and/or kV according to patient size and/or use of iterative reconstruction technique. COMPARISON:  11/04/2021 FINDINGS: Lower chest: The visualized lung bases are clear. The visualized heart pericardium are unremarkable. Small hiatal hernia. Hepatobiliary: No focal liver abnormality is seen. Status post cholecystectomy. No biliary  dilatation. Pancreas: Unremarkable Spleen: Unremarkable Adrenals/Urinary Tract: The adrenal glands are unremarkable. The kidneys are normal in size and position. Previously noted 3 mm calculus within the lower pole the right kidney has now migrated into the right ureterovesicular junction and resultant obstruction with moderate right hydronephrosis and perinephric stranding. No superimposed residual nephrolithiasis. No perinephric fluid collections are identified. Left double-J ureteral stent has been removed. Punctate 1-2 mm nonobstructing calculi are again seen within the upper pole the left kidney, unchanged. No hydronephrosis or ureteral calculi on the left. The bladder is unremarkable. Stomach/Bowel: Moderate to severe pancolonic diverticulosis without superimposed acute inflammatory change. Stomach, small bowel, and large bowel are otherwise unremarkable. No evidence of obstruction or focal inflammation. Appendix normal. No free intraperitoneal gas or fluid. Vascular/Lymphatic: No significant vascular findings are present within the abdomen and pelvis. There is extensive arteriosclerosis of the visualized lower extremity arterial outflow, not well assessed on this noncontrast examination. No aortic aneurysm. No enlarged abdominal or pelvic lymph nodes. Reproductive: Status post hysterectomy. No adnexal masses. Other: No  abdominal wall hernia. Musculoskeletal: Stable grade 2 anterolisthesis L4-5 with associated degenerative changes at the lumbosacral junction. No acute bone abnormality. IMPRESSION: 1. Obstructing 3 mm calculus at the right ureterovesicular junction resulting in moderate right hydronephrosis and perinephric stranding. 2. Minimal nonobstructing left nephrolithiasis. Interval removal of left double-J ureteral stent. 3. Moderate to severe pancolonic diverticulosis without superimposed acute inflammatory change. Electronically Signed   By: Fidela Salisbury M.D.   On: 12/18/2021 00:47    Assessment &  Plan:  56 year old female with a history of recurrent urosepsis and recurrent stone disease s/p right ureteral reimplant who previously failed right ureteral stent placement now admitted with febrile UTI secondary to an obstructing 3 mm right UVJ stone.  We discussed various treatment options today including attempted right ureteral stent placement versus right nephrostomy tube placement.  We discussed that if she again failed attempted right ureteral stent placement, we would have to proceed to right nephrostomy tube regardless.  We discussed the need for antibiotics to treat her current infection with plans for follow-up ureteroscopy to treat her right UVJ stone.  Based on this conversation, she wishes to proceed with right nephrostomy tube placement today. Will contact IR to arrange.  Recommendations: -Continue abx, follow urine cultures -Keep NPO, defer thinners in advance of PCN placement -Right PCN with IR today -Outpatient ureteroscopy in 2-3 weeks  Thank you for involving me in this patient's care, I will continue to follow along.  Debroah Loop, PA-C 12/18/2021 9:18 AM

## 2021-12-18 NOTE — Assessment & Plan Note (Signed)
-   This is superimposed on stage IIIa chronic kidney disease. - She will be hydrated with IV normal saline. - We will follow BMP. - We will hold off HCTZ and Cozaar.

## 2021-12-18 NOTE — Assessment & Plan Note (Signed)
-   The patient will be admitted to a medical telemetry bed. - We will continue antibiotic therapy with IV Rocephin. - She will be hydrated with IV normal saline. - We will follow blood and urine cultures. - We will keep her n.p.o. for now. - Neurology consult will be obtained. - Dr. Lonna Cobb was notified and is aware about the patient.

## 2021-12-18 NOTE — Progress Notes (Signed)
   12/18/21 1523  Assess: MEWS Score  BP 138/79  Pulse Rate 86  Resp (!) 22  SpO2 100 %  O2 Device Room Air  Patient Activity (if Appropriate) Other (Comment)  Assess: MEWS Score  MEWS Temp 2  MEWS Systolic 0  MEWS Pulse 0  MEWS RR 1  MEWS LOC 0  MEWS Score 3  MEWS Score Color Yellow  Treat  Pain Scale 0-10  Pain Score 6  Pain Type Chronic pain  Pain Location Back (kidney pain)  Pain Orientation Posterior  Assess: SIRS CRITERIA  SIRS Temperature  1  SIRS Pulse 0  SIRS Respirations  1  SIRS WBC 0  SIRS Score Sum  2   PRN tylenol given, charge RN aware and MD notified of fever

## 2021-12-18 NOTE — Procedures (Signed)
Interventional Radiology Procedure Note  Procedure: RIGHT perc neph tube placement, 75F  Complications: None  Estimated Blood Loss: None  Recommendations: - Tube to bag drainage    Signed,  Sterling Big, MD

## 2021-12-18 NOTE — H&P (Signed)
[place]   PATIENT NAME: Lindsay Dean    MR#:  [id number]  DATE OF BIRTH:  July 16, 1965  DATE OF ADMISSION:  12/17/2021  PRIMARY CARE PHYSICIAN: Preston Fleeting, MD   Patient is coming from: Home  REQUESTING/REFERRING PHYSICIAN: Chiquita Loth, MD  CHIEF COMPLAINT:   Chief Complaint  Patient presents with   Abdominal Pain   Dysuria    HISTORY OF PRESENT ILLNESS:  Lindsay Dean is a 56 y.o. Hispanic female with medical history significant for type 2 diabetes mellitus, hypertension and SLE, who presented to the ER with acute onset of suprapubic abdominal pain as well as right flank pain, fever and chills with associated dysuria, urinary frequency and urgency.  She admitted to nausea without vomiting.  No cough or wheezing or dyspnea.  No chest pain or palpitations.  She denies any hematuria. ED Course: When she came to the ER, temperature was 102.2 and blood pressure 143/81 with otherwise normal vital signs.  Respiratory rate later on was 26.  Labs revealed a BUN of 26 and creatinine 1.31 compared to 32/1.15 on 11/06/2021 and CBC showed leukocytosis of 11.4.  UA showed 100 protein 21-50 RBCs and more than 50 WBCs with large leukocytes.  Blood and urine cultures were sent.  Imaging: Renal stone CT showed the following: 1. Obstructing 3 mm calculus at the right ureterovesicular junction resulting in moderate right hydronephrosis and perinephric stranding. 2. Minimal nonobstructing left nephrolithiasis. Interval removal of left double-J ureteral stent. 3. Moderate to severe pancolonic diverticulosis without superimposed acute inflammatory change.  The patient was given 2 mg of IV morphine sulfate, 4 mg of IV Zofran, 2 g of IV Rocephin, 650 mg of p.o. Tylenol and 1 L bolus of IV normal saline.  She will be admitted to a medical telemetry bed for further evaluation and management.   PAST MEDICAL HISTORY:   Past Medical History:  Diagnosis Date   Diabetes  mellitus without complication (HCC)    taken off meds in 2017   Hypertension    Lupus (HCC)     PAST SURGICAL HISTORY:   Past Surgical History:  Procedure Laterality Date   ABDOMINAL HYSTERECTOMY     CYSTOSCOPY W/ RETROGRADES Left 10/07/2021   Procedure: CYSTOSCOPY WITH RETROGRADE PYELOGRAM;  Surgeon: Riki Altes, MD;  Location: [facility];  Service: Urology;  Laterality: Left;   CYSTOSCOPY WITH STENT PLACEMENT Right 05/04/2019   Procedure: CYSTOSCOPY with attempt of stent placement;  Surgeon: Sondra Come, MD;  Location: [facility];  Service: Urology;  Laterality: Right;   CYSTOSCOPY/URETEROSCOPY/HOLMIUM LASER/STENT PLACEMENT Left 10/07/2021   Procedure: CYSTOSCOPY/URETEROSCOPY/HOLMIUM LASER/STENT PLACEMENT;  Surgeon: Riki Altes, MD;  Location: [facility];  Service: Urology;  Laterality: Left;    SOCIAL HISTORY:   Social History   Tobacco Use   Smoking status: Never   Smokeless tobacco: Never  Substance Use Topics   Alcohol use: No    FAMILY HISTORY:   Family History  Problem Relation Age of Onset   Diabetes Mother    Heart disease Mother    Hypertension Mother    Arthritis Mother    Hypertension Father    Lung disease Father     DRUG ALLERGIES:   Allergies  Allergen Reactions   Tramadol Nausea Only    Other reaction(s): Dizziness    REVIEW OF SYSTEMS:   ROS As per history of present illness. All pertinent systems were reviewed above. Constitutional, HEENT, cardiovascular, respiratory, GI, GU, musculoskeletal,  neuro, psychiatric, endocrine, integumentary and hematologic systems were reviewed and are otherwise negative/unremarkable except for positive findings mentioned above in the HPI.   MEDICATIONS AT HOME:   Prior to Admission medications   Medication Sig Start Date End Date Taking? Authorizing Provider  atenolol (TENORMIN) 50 MG tablet Take 50 mg by mouth daily.    [provider]  atorvastatin (LIPITOR) 20 MG tablet Take 20 mg by  mouth daily.    [provider]  docusate sodium (COLACE) 100 MG capsule Take 1 capsule (100 mg total) by mouth 2 (two) times daily. 11/07/21   Tresa Moore, MD  hydrochlorothiazide (HYDRODIURIL) 25 MG tablet Take 25 mg by mouth daily. 08/27/21   [provider]  losartan (COZAAR) 100 MG tablet Take 100 mg by mouth daily. 07/25/21   [provider]  omeprazole (PRILOSEC) 20 MG capsule Take 20 mg by mouth daily. 07/25/21   [provider]      VITAL SIGNS:  Blood pressure 115/61, pulse 85, temperature 99 F (37.2 C), resp. rate 18, SpO2 100 %.  PHYSICAL EXAMINATION:  Physical Exam  GENERAL:  56 y.o.-year-old Hispanic female patient lying in the bed with no acute distress.  EYES: Pupils equal, round, reactive to light and accommodation. No scleral icterus. Extraocular muscles intact.  HEENT: Head atraumatic, normocephalic. Oropharynx and nasopharynx clear.  NECK:  Supple, no jugular venous distention. No thyroid enlargement, no tenderness.  LUNGS: Normal breath sounds bilaterally, no wheezing, rales,rhonchi or crepitation. No use of accessory muscles of respiration.  CARDIOVASCULAR: Regular rate and rhythm, S1, S2 normal. No murmurs, rubs, or gallops.  ABDOMEN: Soft, nondistended, with mild suprapubic and right CVA tenderness.  No rebound tenderness guarding or rigidity.. Bowel sounds present. No organomegaly or mass.  EXTREMITIES: No pedal edema, cyanosis, or clubbing.  NEUROLOGIC: Cranial nerves II through XII are intact. Muscle strength 5/5 in all extremities. Sensation intact. Gait not checked.  PSYCHIATRIC: The patient is alert and oriented x 3.  Normal affect and good eye contact. SKIN: No obvious rash, lesion, or ulcer.   LABORATORY PANEL:   CBC Recent Labs  Lab 12/17/21 2146  WBC 11.4*  HGB 11.7*  HCT 35.9*  PLT 199    ------------------------------------------------------------------------------------------------------------------  Chemistries  Recent Labs  Lab 12/17/21 2146  NA 136  K 4.0  CL 108  CO2 19*  GLUCOSE 185*  BUN 26*  CREATININE 1.31*  CALCIUM 9.0  AST 27  ALT 21  ALKPHOS 95  BILITOT 0.5   ------------------------------------------------------------------------------------------------------------------  Cardiac Enzymes No results for input(s): "TROPONINI" in the last 168 hours. ------------------------------------------------------------------------------------------------------------------  RADIOLOGY:  CT Renal Stone Study  Result Date: 12/18/2021 CLINICAL DATA:  Nausea, dysuria, flank pain EXAM: CT ABDOMEN AND PELVIS WITHOUT CONTRAST TECHNIQUE: Multidetector CT imaging of the abdomen and pelvis was performed following the standard protocol without IV contrast. RADIATION DOSE REDUCTION: This exam was performed according to the departmental dose-optimization program which includes automated exposure control, adjustment of the mA and/or kV according to patient size and/or use of iterative reconstruction technique. COMPARISON:  11/04/2021 FINDINGS: Lower chest: The visualized lung bases are clear. The visualized heart pericardium are unremarkable. Small hiatal hernia. Hepatobiliary: No focal liver abnormality is seen. Status post cholecystectomy. No biliary dilatation. Pancreas: Unremarkable Spleen: Unremarkable Adrenals/Urinary Tract: The adrenal glands are unremarkable. The kidneys are normal in size and position. Previously noted 3 mm calculus within the lower pole the right kidney has now migrated into the right ureterovesicular junction and resultant obstruction with moderate  right hydronephrosis and perinephric stranding. No superimposed residual nephrolithiasis. No perinephric fluid collections are identified. Left double-J ureteral stent has been removed. Punctate 1-2 mm  nonobstructing calculi are again seen within the upper pole the left kidney, unchanged. No hydronephrosis or ureteral calculi on the left. The bladder is unremarkable. Stomach/Bowel: Moderate to severe pancolonic diverticulosis without superimposed acute inflammatory change. Stomach, small bowel, and large bowel are otherwise unremarkable. No evidence of obstruction or focal inflammation. Appendix normal. No free intraperitoneal gas or fluid. Vascular/Lymphatic: No significant vascular findings are present within the abdomen and pelvis. There is extensive arteriosclerosis of the visualized lower extremity arterial outflow, not well assessed on this noncontrast examination. No aortic aneurysm. No enlarged abdominal or pelvic lymph nodes. Reproductive: Status post hysterectomy. No adnexal masses. Other: No abdominal wall hernia. Musculoskeletal: Stable grade 2 anterolisthesis L4-5 with associated degenerative changes at the lumbosacral junction. No acute bone abnormality. IMPRESSION: 1. Obstructing 3 mm calculus at the right ureterovesicular junction resulting in moderate right hydronephrosis and perinephric stranding. 2. Minimal nonobstructing left nephrolithiasis. Interval removal of left double-J ureteral stent. 3. Moderate to severe pancolonic diverticulosis without superimposed acute inflammatory change. Electronically Signed   By: Fidela Salisbury M.D.   On: 12/18/2021 00:47      IMPRESSION AND PLAN:  Assessment and Plan: * Sepsis due to gram-negative UTI [facility]) - The patient will be admitted to a medical telemetry bed. - We will continue antibiotic therapy with IV Rocephin. - She will be hydrated with IV normal saline. - We will follow blood and urine cultures. - We will keep her n.p.o. for now. - Neurology consult will be obtained. - Dr. Bernardo Heater was notified and is aware about the patient.  Acute unilateral obstructive uropathy - She will be hydrated with IV normal saline. - We will strain her  urine. - Pain management will be provided. - We will keep her n.p.o. - Urology consult will be obtained as mentioned above.  Acute kidney injury superimposed on chronic kidney disease ([place]) - This is superimposed on stage IIIa chronic kidney disease. - She will be hydrated with IV normal saline. - We will follow BMP. - We will hold off HCTZ and Cozaar.  Type 2 diabetes mellitus without complications ([place]) - The patient will be placed on supplement coverage with NovoLog.  Ureterolithiasis - She has right obstructing 3 mm ureterovesical stone. - Management as above  GERD without esophagitis -We will continue PPI therapy.  Dyslipidemia - We will continue statin therapy.  Essential hypertension - We will continue atenolol and hold HCTZ and Cozaar given acute kidney injury.   DVT prophylaxis: Lovenox. Advanced Care Planning:  Code Status: She is DNR/DNI.  This was discussed with her.  Family Communication:  The plan of care was discussed in details with the patient (and family). I answered all questions. The patient agreed to proceed with the above mentioned plan. Further management will depend upon hospital course. Disposition Plan: Back to previous home environment Consults called: Urology. All the records are reviewed and case discussed with ED provider.  Status is: Inpatient  At the time of the admission, it appears that the appropriate admission status for this patient is inpatient.  This is judged to be reasonable and necessary in order to provide the required intensity of service to ensure the patient's safety given the presenting symptoms, physical exam findings and initial radiographic and laboratory data in the context of comorbid conditions.  The patient requires inpatient status due to high intensity of  service, high risk of further deterioration and high frequency of surveillance required.  I certify that at the time of admission, it is my clinical judgment that the  patient will require inpatient hospital care extending more than 2 midnights.                            Dispo: The patient is from: Home              Anticipated d/c is to: Home              Patient currently is not medically stable to d/c.              Difficult to place patient: No  Christel Mormon M.D on 12/18/2021 at 4:44 AM  [facility]   From 7 PM-7 AM, contact night-coverage [email or web address]  CC: Primary care physician; Revelo, Elyse Jarvis, MD

## 2021-12-18 NOTE — TOC Progression Note (Signed)
Transition of Care [facility]) - Progression Note    Patient Details  Name: Lindsay Dean MRN: [id number] Date of Birth: 01-Dec-1965  Transition of Care [facility]) CM/SW Contact  Durwin Glaze, LCSW Phone Number: 12/18/2021, 1:56 PM  Clinical Narrative:    CSW reviewed pt's chart, there are no TOC needs at this time.        Expected Discharge Plan and Services                                                 Social Determinants of Health (SDOH) Interventions    Readmission Risk Interventions     No data to display

## 2021-12-18 NOTE — Assessment & Plan Note (Signed)
-   We will continue statin therapy. 

## 2021-12-18 NOTE — Progress Notes (Signed)
   12/18/21 1610  Assess: MEWS Score  Temp (!) 103.2 F (39.6 C)  BP 135/79  Pulse Rate 84  Resp (!) 26  Level of Consciousness Alert  SpO2 92 %  O2 Device Room Air  Assess: MEWS Score  MEWS Temp 2  MEWS Systolic 0  MEWS Pulse 0  MEWS RR 2  MEWS LOC 0  MEWS Score 4  MEWS Score Color Red  Assess: if the MEWS score is Yellow or Red  Were vital signs taken at a resting state? Yes  Focused Assessment No change from prior assessment  Does the patient meet 2 or more of the SIRS criteria? Yes  Does the patient have a confirmed or suspected source of infection? Yes  Provider and Rapid Response Notified? Yes  MEWS guidelines implemented *See Row Information* Yes  Treat  MEWS Interventions Administered scheduled meds/treatments  Pain Scale 0-10  Pain Score 0  Take Vital Signs  Increase Vital Sign Frequency  Red: Q 1hr X 4 then Q 4hr X 4, if remains red, continue Q 4hrs  Escalate  MEWS: Escalate Red: discuss with charge nurse/RN and provider, consider discussing with RRT  Notify: Charge Nurse/RN  Name of Charge Nurse/RN Notified Amanda, RN  Date Charge Nurse/RN Notified 12/18/21  Time Charge Nurse/RN Notified 1625  Notify: Provider  Provider Name/Title Dr.Patel  Date Provider Notified 12/18/21  Time Provider Notified 1626  Method of Notification  (secure chat)  Notification Reason Critical result  Assess: SIRS CRITERIA  SIRS Temperature  1  SIRS Pulse 0  SIRS Respirations  1  SIRS WBC 0  SIRS Score Sum  2   Patient returned from IR, temp 103.2. ICU Charge and MD notified per protocol. No new orders

## 2021-12-18 NOTE — Assessment & Plan Note (Signed)
-   She will be hydrated with IV normal saline. - We will strain her urine. - Pain management will be provided. - We will keep her n.p.o. - Urology consult will be obtained as mentioned above.

## 2021-12-18 NOTE — Assessment & Plan Note (Addendum)
-   She has right obstructing 3 mm ureterovesical stone. - Management as above

## 2021-12-18 NOTE — Assessment & Plan Note (Signed)
-   The patient will be placed on supplement coverage with NovoLog 

## 2021-12-19 DIAGNOSIS — N201 Calculus of ureter: Secondary | ICD-10-CM | POA: Diagnosis not present

## 2021-12-19 DIAGNOSIS — N390 Urinary tract infection, site not specified: Secondary | ICD-10-CM | POA: Diagnosis not present

## 2021-12-19 DIAGNOSIS — A4150 Gram-negative sepsis, unspecified: Secondary | ICD-10-CM | POA: Diagnosis not present

## 2021-12-19 DIAGNOSIS — E119 Type 2 diabetes mellitus without complications: Secondary | ICD-10-CM

## 2021-12-19 DIAGNOSIS — N179 Acute kidney failure, unspecified: Secondary | ICD-10-CM | POA: Diagnosis not present

## 2021-12-19 LAB — GLUCOSE, CAPILLARY
Glucose-Capillary: 101 mg/dL — ABNORMAL HIGH (ref 70–99)
Glucose-Capillary: 119 mg/dL — ABNORMAL HIGH (ref 70–99)

## 2021-12-19 MED ORDER — FLUCONAZOLE IN SODIUM CHLORIDE 200-0.9 MG/100ML-% IV SOLN
200.0000 mg | Freq: Once | INTRAVENOUS | Status: AC
  Administered 2021-12-19: 200 mg via INTRAVENOUS
  Filled 2021-12-19: qty 100

## 2021-12-19 MED ORDER — ENOXAPARIN SODIUM 40 MG/0.4ML IJ SOSY
40.0000 mg | PREFILLED_SYRINGE | INTRAMUSCULAR | Status: DC
  Administered 2021-12-19 – 2021-12-22 (×4): 40 mg via SUBCUTANEOUS
  Filled 2021-12-19 (×4): qty 0.4

## 2021-12-19 MED ORDER — PIPERACILLIN-TAZOBACTAM 3.375 G IVPB: 3.3750 g | Freq: Three times a day (TID) | INTRAVENOUS | Status: DC

## 2021-12-19 MED ORDER — IBUPROFEN 400 MG PO TABS
600.0000 mg | ORAL_TABLET | Freq: Once | ORAL | Status: AC
  Administered 2021-12-19: 600 mg via ORAL
  Filled 2021-12-19: qty 2

## 2021-12-19 MED ORDER — FLUCONAZOLE IN SODIUM CHLORIDE 200-0.9 MG/100ML-% IV SOLN
200.0000 mg | INTRAVENOUS | Status: DC
Start: 2021-12-19 — End: 2021-12-19
  Filled 2021-12-19: qty 100

## 2021-12-19 MED ORDER — FLUCONAZOLE IN SODIUM CHLORIDE 400-0.9 MG/200ML-% IV SOLN
400.0000 mg | INTRAVENOUS | Status: DC
  Administered 2021-12-20 – 2021-12-22 (×3): 400 mg via INTRAVENOUS
  Filled 2021-12-19 (×3): qty 200

## 2021-12-19 MED ORDER — SODIUM CHLORIDE 0.9 % IV SOLN: 100.0000 mg | INTRAVENOUS | Status: DC

## 2021-12-19 MED ORDER — FLUCONAZOLE IN SODIUM CHLORIDE 200-0.9 MG/100ML-% IV SOLN: 200.0000 mg | INTRAVENOUS | Status: DC

## 2021-12-19 MED ORDER — PIPERACILLIN-TAZOBACTAM 3.375 G IVPB
3.3750 g | Freq: Three times a day (TID) | INTRAVENOUS | Status: DC
  Administered 2021-12-19 – 2021-12-22 (×8): 3.375 g via INTRAVENOUS
  Filled 2021-12-19 (×8): qty 50

## 2021-12-19 MED ORDER — TAMSULOSIN HCL 0.4 MG PO CAPS
0.4000 mg | ORAL_CAPSULE | Freq: Every day | ORAL | Status: DC
  Administered 2021-12-19 – 2021-12-23 (×5): 0.4 mg via ORAL
  Filled 2021-12-19 (×5): qty 1

## 2021-12-19 NOTE — Plan of Care (Signed)

## 2021-12-19 NOTE — Progress Notes (Signed)
[facility]  - [place] [facility]   PATIENT NAME: Lindsay Dean    MR#:  [id number]  DATE OF BIRTH:  Mar 04, 1966  SUBJECTIVE:   via video interpreter patient had Tmax of 103 last evening. Temperature 100.5. No family in the room. Poor appetite and feeling weak today. Abdominal pain/flank pain much improved.   VITALS:  Blood pressure 127/81, pulse 88, temperature (!) 102.9 F (39.4 C), temperature source Oral, resp. rate 18, SpO2 97 %.  PHYSICAL EXAMINATION:   GENERAL:  56-year-old patient lying in the bed with no acute distress.  LUNGS: Normal breath sounds bilaterally, no wheezing, rales, rhonchi.  CARDIOVASCULAR: S1, S2 normal. No murmurs, rubs, or gallops.  ABDOMEN: Soft, nontender, nondistended. Bowel sounds present.  EXTREMITIES: No  edema b/l.    NEUROLOGIC: nonfocal  patient is alert and awake SKIN: No obvious rash, lesion, or ulcer.   LABORATORY PANEL:  CBC Recent Labs  Lab 12/18/21 0322  WBC 9.7  HGB 10.9*  HCT 34.3*  PLT 158    Chemistries  Recent Labs  Lab 12/17/21 2146 12/18/21 0322  NA 136 138  K 4.0 4.4  CL 108 111  CO2 19* 22  GLUCOSE 185* 147*  BUN 26* 25*  CREATININE 1.31* 1.44*  CALCIUM 9.0 7.9*  AST 27  --   ALT 21  --   ALKPHOS 95  --   BILITOT 0.5  --    Cardiac Enzymes No results for input(s): "TROPONINI" in the last 168 hours. RADIOLOGY:  IR NEPHROSTOMY PLACEMENT RIGHT  Result Date: 12/18/2021 INDICATION: 56 year old female with a history of right-sided stone disease complicated by right distal ureteral stricture requiring ureteral reimplantation. She presents with recurrent distal right UVJ stone with obstruction and infection. She is not a good candidate for double-J stent placement and is therefore referred to interventional radiology for percutaneous nephrostomy tube placement. EXAM: IR NEPHROSTOMY PLACEMENT RIGHT COMPARISON:  None Available. MEDICATIONS: In patient currently on Rocephin. This is  adequate antibiotic coverage and no additional prophylaxis was administered. ANESTHESIA/SEDATION: Fentanyl 25 mcg IV; Versed 1 mg IV Moderate Sedation Time:  12 minutes The patient's vital signs and level of consciousness were continuously monitored during the procedure by the interventional radiology nurse under my direct supervision. CONTRAST:  44mL OMNIPAQUE IOHEXOL 350 MG/ML SOLN - administered into the collecting system(s) FLUOROSCOPY: Radiation exposure index: 6.6 mGy reference air kerma COMPLICATIONS: None immediate. TECHNIQUE: The procedure, risks, benefits, and alternatives were explained to the patient. Questions regarding the procedure were encouraged and answered. The patient understands and consents to the procedure. The right flank was prepped with chlorhexidine in a sterile fashion, and a sterile drape was applied covering the operative field. A sterile gown and sterile gloves were used for the procedure. Local anesthesia was provided with 1% Lidocaine. The right flank was interrogated with ultrasound and the left kidney identified. The kidney is hydronephrotic. A suitable access site on the skin overlying the lower pole, posterior calix was identified. After local mg anesthesia was achieved, a small skin nick was made with an 11 blade scalpel. A 21 gauge Accustick needle was then advanced under direct sonographic guidance into the lower pole of the right kidney. A 0.018 inch wire was advanced under fluoroscopic guidance into the left renal collecting system. The Accustick sheath was then advanced over the wire and a 0.018 system exchanged for a 0.035 system. Gentle hand injection of contrast material confirms placement of the sheath within the renal collecting system. There  is moderate hydronephrosis. The tract from the scan into the renal collecting system was then dilated serially to 10-French. A 10-French Cook all-purpose drain was then placed and positioned under fluoroscopic guidance. The locking  loop is well formed within the left renal pelvis. The catheter was secured to the skin with 0 Prolene and a sterile bandage was placed. Catheter was left to gravity bag drainage. IMPRESSION: Successful placement of a right 10 French percutaneous nephrostomy tube. Electronically Signed   By: Jacqulynn Cadet M.D.   On: 12/18/2021 16:38   CT Renal Stone Study  Result Date: 12/18/2021 CLINICAL DATA:  Nausea, dysuria, flank pain EXAM: CT ABDOMEN AND PELVIS WITHOUT CONTRAST TECHNIQUE: Multidetector CT imaging of the abdomen and pelvis was performed following the standard protocol without IV contrast. RADIATION DOSE REDUCTION: This exam was performed according to the departmental dose-optimization program which includes automated exposure control, adjustment of the mA and/or kV according to patient size and/or use of iterative reconstruction technique. COMPARISON:  11/04/2021 FINDINGS: Lower chest: The visualized lung bases are clear. The visualized heart pericardium are unremarkable. Small hiatal hernia. Hepatobiliary: No focal liver abnormality is seen. Status post cholecystectomy. No biliary dilatation. Pancreas: Unremarkable Spleen: Unremarkable Adrenals/Urinary Tract: The adrenal glands are unremarkable. The kidneys are normal in size and position. Previously noted 3 mm calculus within the lower pole the right kidney has now migrated into the right ureterovesicular junction and resultant obstruction with moderate right hydronephrosis and perinephric stranding. No superimposed residual nephrolithiasis. No perinephric fluid collections are identified. Left double-J ureteral stent has been removed. Punctate 1-2 mm nonobstructing calculi are again seen within the upper pole the left kidney, unchanged. No hydronephrosis or ureteral calculi on the left. The bladder is unremarkable. Stomach/Bowel: Moderate to severe pancolonic diverticulosis without superimposed acute inflammatory change. Stomach, small bowel, and  large bowel are otherwise unremarkable. No evidence of obstruction or focal inflammation. Appendix normal. No free intraperitoneal gas or fluid. Vascular/Lymphatic: No significant vascular findings are present within the abdomen and pelvis. There is extensive arteriosclerosis of the visualized lower extremity arterial outflow, not well assessed on this noncontrast examination. No aortic aneurysm. No enlarged abdominal or pelvic lymph nodes. Reproductive: Status post hysterectomy. No adnexal masses. Other: No abdominal wall hernia. Musculoskeletal: Stable grade 2 anterolisthesis L4-5 with associated degenerative changes at the lumbosacral junction. No acute bone abnormality. IMPRESSION: 1. Obstructing 3 mm calculus at the right ureterovesicular junction resulting in moderate right hydronephrosis and perinephric stranding. 2. Minimal nonobstructing left nephrolithiasis. Interval removal of left double-J ureteral stent. 3. Moderate to severe pancolonic diverticulosis without superimposed acute inflammatory change. Electronically Signed   By: Fidela Salisbury M.D.   On: 12/18/2021 00:47    Assessment and Plan  Lindsay Dean is a 56 y.o. Hispanic female with medical history significant for type 2 diabetes mellitus, hypertension and SLE, who presented to the ER with acute onset of suprapubic abdominal pain as well as right flank pain, fever and chills with associated dysuria, urinary frequency and urgency.  Imaging: Renal stone CT showed the following: 1. Obstructing 3 mm calculus at the right ureterovesicular junction resulting in moderate right hydronephrosis and perinephric stranding. 2. Minimal nonobstructing left nephrolithiasis. Interval removal of left double-J ureteral stent. 3. Moderate to severe pancolonic diverticulosis without superimposed acute inflammatory change.  Sepsis secondary to UTI in the setting of ureteral stone with obstructive uropathy. -- IV fluids -- IV Rocephin -- blood  culture negative -- urine culture growing 80,000 yeast -- discussed with pharmacy started on  IV fluconazole -- urology consult appreciated-- Dr. Lonna Cobb recommended IR place right nephrostomy tube given complexity from previous procedures -- status post right nephrostomy tube placement on 12/18/2021 -- patient continues to spike high-grade fever. Will consult ID for additional help  acute kidney injury superimposed on chronic kidney disease IIIa -- continue IV fluids -- hold hydrochlorothiazide/Cozaar  type II diabetes with CKD, sugars well-controlled -- continue sliding scale insulin  Gerd without esophagitis -- continue PPI  Hypertension -- continue atenolol -- hold hydrochlorothiazide and Cozaar for now   Procedures: right nephrostomy tube by IR on 6/29 Family communication :none today Consults : urology ID, IR CODE STATUS: full DVT Prophylaxis : Lovenox Level of care: Med-Surg Status is: Inpatient Remains inpatient appropriate because: continues with fever. ID consultation placed  anticipate discharge 1 to 3 days    TOTAL TIME TAKING CARE OF THIS PATIENT: 35 minutes.  >50% time spent on counselling and coordination of care  Note: This dictation was prepared with Dragon dictation along with smaller phrase technology. Any transcriptional errors that result from this process are unintentional.  Enedina Finner M.D    [facility]   CC: Primary care physician; Neita Goodnight, Presley Raddle, MD

## 2021-12-19 NOTE — Progress Notes (Signed)
Urology Inpatient Progress Note  Subjective: She has been febrile overnight.  She is normotensive, normal cardiac.  She has been a bit intermittently tachypneic as well. No CBC or BMP available today.  Blood cultures pending with no growth at 1 and 2 days.  Urine culture pending.  On antibiotics as below. Right nephrostomy tube in place draining clear, yellow urine. Patient reports feeling well.  She denies pain.  Anti-infectives: Anti-infectives (From admission, onward)    Start     Dose/Rate Route Frequency Ordered Stop   12/18/21 2200  cefTRIAXone (ROCEPHIN) 2 g in sodium chloride 0.9 % 100 mL IVPB        2 g 200 mL/hr over 30 Minutes Intravenous Every 24 hours 12/18/21 0228     12/18/21 0030  cefTRIAXone (ROCEPHIN) 2 g in sodium chloride 0.9 % 100 mL IVPB        2 g 200 mL/hr over 30 Minutes Intravenous  Once 12/18/21 0021 12/18/21 0138       Current Facility-Administered Medications  Medication Dose Route Frequency Provider Last Rate Last Admin   0.9 %  sodium chloride infusion   Intravenous Continuous Mansy, Jan A, MD 100 mL/hr at 12/18/21 0306 New Bag at 12/18/21 0306   acetaminophen (TYLENOL) tablet 650 mg  650 mg Oral Q6H PRN Mansy, Jan A, MD   650 mg at 12/19/21 0208   Or   acetaminophen (TYLENOL) suppository 650 mg  650 mg Rectal Q6H PRN Mansy, Jan A, MD       atenolol (TENORMIN) tablet 50 mg  50 mg Oral Daily Mansy, Jan A, MD   50 mg at 12/18/21 0846   atorvastatin (LIPITOR) tablet 20 mg  20 mg Oral Daily Mansy, Jan A, MD   20 mg at 12/18/21 0845   cefTRIAXone (ROCEPHIN) 2 g in sodium chloride 0.9 % 100 mL IVPB  2 g Intravenous Q24H Mansy, Jan A, MD 200 mL/hr at 12/18/21 2010 2 g at 12/18/21 2010   docusate sodium (COLACE) capsule 100 mg  100 mg Oral BID Mansy, Jan A, MD   100 mg at 12/18/21 2011   magnesium hydroxide (MILK OF MAGNESIA) suspension 30 mL  30 mL Oral Daily PRN Mansy, Jan A, MD       morphine (PF) 2 MG/ML injection 2 mg  2 mg Intravenous Q4H PRN Mansy, Jan  A, MD   2 mg at 12/18/21 1445   ondansetron (ZOFRAN) tablet 4 mg  4 mg Oral Q6H PRN Mansy, Jan A, MD       Or   ondansetron [facility]) injection 4 mg  4 mg Intravenous Q6H PRN Mansy, Jan A, MD   4 mg at 12/18/21 0306   pantoprazole (PROTONIX) EC tablet 40 mg  40 mg Oral Daily Mansy, Jan A, MD   40 mg at 12/18/21 0845   traZODone (DESYREL) tablet 25 mg  25 mg Oral QHS PRN Mansy, Jan A, MD         Objective: Vital signs in last 24 hours: Temp:  [99.1 F (37.3 C)-103.2 F (39.6 C)] 100.5 F (38.1 C) (06/30 0600) Pulse Rate:  [81-95] 83 (06/30 0600) Resp:  [18-28] 20 (06/30 0600) BP: (99-138)/(64-84) 121/80 (06/30 0600) SpO2:  [92 %-100 %] 97 % (06/30 0600)  Intake/Output from previous day: 06/29 0701 - 06/30 0700 In: 1318.2 [P.O.:240; I.V.:1078.2] Out: 600 [Urine:600] Intake/Output this shift: No intake/output data recorded.  Physical Exam Vitals and nursing note reviewed.  Constitutional:      General:  She is not in acute distress.    Appearance: She is not ill-appearing, toxic-appearing or diaphoretic.  HENT:     Head: Normocephalic and atraumatic.  Pulmonary:     Effort: Pulmonary effort is normal. No respiratory distress.  Skin:    General: Skin is warm and dry.  Neurological:     Mental Status: She is alert and oriented to person, place, and time.  Psychiatric:        Mood and Affect: Mood normal.        Behavior: Behavior normal.     Lab Results:  Recent Labs    12/17/21 2146 12/18/21 0322  WBC 11.4* 9.7  HGB 11.7* 10.9*  HCT 35.9* 34.3*  PLT 199 158   BMET Recent Labs    12/17/21 2146 12/18/21 0322  NA 136 138  K 4.0 4.4  CL 108 111  CO2 19* 22  GLUCOSE 185* 147*  BUN 26* 25*  CREATININE 1.31* 1.44*  CALCIUM 9.0 7.9*   Assessment & Plan: 56 year old female with a history of recurrent urosepsis and recurrent stone disease s/p right ureteral reimplant who previously failed right ureteral stent placement now admitted with febrile UTI secondary to  an obstructing 3 mm right UVJ stone.  She is now s/p right PCN placement with IR yesterday.  She remains febrile on empiric antibiotics but reports feeling better today.  Recommendations: -Continue antibiotics and follow cultures.  She will require a total of 10 to 14 days of culture appropriate therapy -Continue Flomax 0.4 mg daily for MET -Nephrostogram in 1 week -If nephrostogram positive for retained stone, outpatient ureteroscopy in 2 to 3 weeks with antegrade indigocarmine via nephrostomy tube for UO identification  Debroah Loop, PA-C 12/19/2021

## 2021-12-19 NOTE — Progress Notes (Signed)
Hospitalist made aware of oral temp of 102.9. MEWS policy implemented per protocol.

## 2021-12-19 NOTE — Progress Notes (Signed)
   12/19/21 0200  Assess: MEWS Score  Temp (!) 102.9 F (39.4 C)  BP 135/79  MAP (mmHg) 96  Pulse Rate 95  Resp 20  Level of Consciousness Alert  SpO2 99 %  O2 Device Room Air  Patient Activity (if Appropriate) In bed  Assess: MEWS Score  MEWS Temp 2  MEWS Systolic 0  MEWS Pulse 0  MEWS RR 0  MEWS LOC 0  MEWS Score 2  MEWS Score Color Yellow  Assess: if the MEWS score is Yellow or Red  Were vital signs taken at a resting state? Yes  Focused Assessment No change from prior assessment  Does the patient meet 2 or more of the SIRS criteria? Yes  Does the patient have a confirmed or suspected source of infection? Yes  Provider and Rapid Response Notified? Yes  MEWS guidelines implemented *See Row Information* No, previously red, continue vital signs every 4 hours  Treat  MEWS Interventions Administered prn meds/treatments  Take Vital Signs  Increase Vital Sign Frequency  Yellow: Q 2hr X 2 then Q 4hr X 2, if remains yellow, continue Q 4hrs  Escalate  MEWS: Escalate Yellow: discuss with charge nurse/RN and consider discussing with provider and RRT  Notify: Charge Nurse/RN  Name of Charge Nurse/RN Notified Jackie, RN  Date Charge Nurse/RN Notified 12/19/21  Time Charge Nurse/RN Notified 0200  Notify: Provider  Provider Name/Title B. Jon Billings  Date Provider Notified 12/19/21  Time Provider Notified 0200  Method of Notification  (secure chat)  Notification Reason Critical result  Provider response No new orders  Date of Provider Response 12/19/21  Time of Provider Response 0217  Assess: SIRS CRITERIA  SIRS Temperature  1  SIRS Pulse 1  SIRS Respirations  0  SIRS WBC 0  SIRS Score Sum  2

## 2021-12-19 NOTE — Consult Note (Addendum)
Pharmacy Antibiotic Note  Lindsay Dean is a 56 y.o. female admitted on 12/17/2021 with  candiduria .  Pharmacy has been consulted for fluconazole dosing.  Plan: Give Fluconazole 200mg  IV x1 followed by 100mg  every 24 hours     Temp (24hrs), Avg:101.4 F (38.6 C), Min:100.1 F (37.8 C), Max:103.2 F (39.6 C)  Recent Labs  Lab 12/17/21 2146 12/18/21 0322  WBC 11.4* 9.7  CREATININE 1.31* 1.44*  LATICACIDVEN 1.6  --    6/30 CrCl (adj) 39 ml/min calculated based on weight of 69.2kg (11/07/21) and height of 57 in.  CrCl cannot be calculated (Unknown ideal weight.).    Allergies  Allergen Reactions   Tramadol Nausea Only    Other reaction(s): Dizziness    Antimicrobials this admission: 6/30 Fluconazole >>  6/29 Ceftriaxone >>   Dose adjustments this admission:   Microbiology results: 6/28 BCx: NGTD 6/28 UCx: 80,000 colonies/mL yeast   Thank you for allowing pharmacy to be a part of this patient's care.  7/28 12/19/2021 11:25 AM

## 2021-12-19 NOTE — Consult Note (Addendum)
NAME: Lindsay Dean  DOB: 10/24/1965  MRN: [id number]  Date/Time: 12/19/2021 12:13 PM  REQUESTING PROVIDER: Dr. Allena Katz Subjective:  REASON FOR CONSULT: Complicated UTI ? Lindsay Dean is a 56 y.o. female with a history of diabetes mellitus, hypertension, lupus has complicated urinary tract infection and urological history presents with abdominal pain and flank pain on the right side for few days duration with fever chills and dysuria.. Patient in December 2019 underwent total vaginal hysterectomy, unilateral  uterosacral ligament suspension, anterior colporrhaphy, transobturator mid urethral sling,.  This was complicated by cystotomy during the surgery requiring reimplantation of the right ureter and placement of right ureteral stent. She was hospitalized at [facility] 10/06/2021 until 10/08/2021 for left ureteral urolithiasis and had left ureteral stent placement On 2 occasions after that when the stent was to be exchanged she was having pyuria hence it was postponed and she was treated with antibiotics including Bactrim.  On 11/14/2021 she underwent a flexible cystoscopy and stent removal by Dr. Lonna Cobb She now presents with abdominal pain and flank pain with fever and chills and associated dysuria, frequency and urgency on 12/18/2021.  In the ED temperature was 102.2, BP 143/81, respiratory rate was 26.  And pulse of 86. Blood culture and urine culture was sent CT of the abdomen showed obstructing 3 mm calculus at the right ureterovesicular junction resulting in moderate right hydronephrosis and perinephric stranding.  Moderate to severe pancolonic diverticulosis without inflammation. Blood culture is negative so far Urine culture has Candida albicans Patient is currently on IV ceftriaxone and fluconazole. Because of difficulty with stenting in the past patient underwent right nephrostomy yesterday. I am seeing the patient for complicated UTI. Patient had fever this morning But now she is  feeling little better Pain abdomen and flank better  Past Medical History:  Diagnosis Date   Diabetes mellitus without complication (HCC)    taken off meds in 2017   Hypertension    Lupus (HCC)     Past Surgical History:  Procedure Laterality Date   ABDOMINAL HYSTERECTOMY     CYSTOSCOPY W/ RETROGRADES Left 10/07/2021   Procedure: CYSTOSCOPY WITH RETROGRADE PYELOGRAM;  Surgeon: Riki Altes, MD;  Location: [facility] ORS;  Service: Urology;  Laterality: Left;   CYSTOSCOPY WITH STENT PLACEMENT Right 05/04/2019   Procedure: CYSTOSCOPY with attempt of stent placement;  Surgeon: Sondra Come, MD;  Location: [facility] ORS;  Service: Urology;  Laterality: Right;   CYSTOSCOPY/URETEROSCOPY/HOLMIUM LASER/STENT PLACEMENT Left 10/07/2021   Procedure: CYSTOSCOPY/URETEROSCOPY/HOLMIUM LASER/STENT PLACEMENT;  Surgeon: Riki Altes, MD;  Location: [facility] ORS;  Service: Urology;  Laterality: Left;   IR NEPHROSTOMY PLACEMENT RIGHT  12/18/2021    Social History   Socioeconomic History   Marital status: Married    Spouse name: Not on file   Number of children: Not on file   Years of education: Not on file   Highest education level: Not on file  Occupational History   Not on file  Tobacco Use   Smoking status: Never   Smokeless tobacco: Never  Vaping Use   Vaping Use: Never used  Substance and Sexual Activity   Alcohol use: No   Drug use: No   Sexual activity: Not on file  Other Topics Concern   Not on file  Social History Narrative   Not on file   Social Determinants of Health   Financial Resource Strain: Not on file  Food Insecurity: Not on file  Transportation Needs: Not on file  Physical Activity: Not on  file  Stress: Not on file  Social Connections: Not on file  Intimate Partner Violence: Not on file    Family History  Problem Relation Age of Onset   Diabetes Mother    Heart disease Mother    Hypertension Mother    Arthritis Mother    Hypertension Father    Lung disease  Father    Allergies  Allergen Reactions   Tramadol Nausea Only    Other reaction(s): Dizziness   I? Current Facility-Administered Medications  Medication Dose Route Frequency Provider Last Rate Last Admin   0.9 %  sodium chloride infusion   Intravenous Continuous Mansy, Jan A, MD 100 mL/hr at 12/18/21 0306 New Bag at 12/18/21 0306   acetaminophen (TYLENOL) tablet 650 mg  650 mg Oral Q6H PRN Mansy, Jan A, MD   650 mg at 12/19/21 1206   Or   acetaminophen (TYLENOL) suppository 650 mg  650 mg Rectal Q6H PRN Mansy, Jan A, MD       atenolol (TENORMIN) tablet 50 mg  50 mg Oral Daily Mansy, Jan A, MD   50 mg at 12/19/21 0941   atorvastatin (LIPITOR) tablet 20 mg  20 mg Oral Daily Mansy, Jan A, MD   20 mg at 12/19/21 0941   cefTRIAXone (ROCEPHIN) 2 g in sodium chloride 0.9 % 100 mL IVPB  2 g Intravenous Q24H Mansy, Jan A, MD 200 mL/hr at 12/18/21 2010 2 g at 12/18/21 2010   docusate sodium (COLACE) capsule 100 mg  100 mg Oral BID Mansy, Jan A, MD   100 mg at 12/19/21 0941   enoxaparin (LOVENOX) injection 40 mg  40 mg Subcutaneous Q24H Fritzi Mandes, MD   40 mg at 12/19/21 1209   fluconazole (DIFLUCAN) IVPB 200 mg  200 mg Intravenous Once Darrick Penna, RPH 100 mL/hr at 12/19/21 1209 200 mg at 12/19/21 1209   Followed by   Derrill Memo ON 12/20/2021] fluconazole (DIFLUCAN) IVPB 200 mg  200 mg Intravenous Q24H Darrick Penna, RPH       magnesium hydroxide (MILK OF MAGNESIA) suspension 30 mL  30 mL Oral Daily PRN Mansy, Jan A, MD       morphine (PF) 2 MG/ML injection 2 mg  2 mg Intravenous Q4H PRN Mansy, Jan A, MD   2 mg at 12/18/21 1445   ondansetron (ZOFRAN) tablet 4 mg  4 mg Oral Q6H PRN Mansy, Jan A, MD       Or   ondansetron [facility]) injection 4 mg  4 mg Intravenous Q6H PRN Mansy, Jan A, MD   4 mg at 12/18/21 0306   pantoprazole (PROTONIX) EC tablet 40 mg  40 mg Oral Daily Mansy, Jan A, MD   40 mg at 12/19/21 0941   tamsulosin (FLOMAX) capsule 0.4 mg  0.4 mg Oral Daily Vaillancourt, Samantha, PA-C    0.4 mg at 12/19/21 0941   traZODone (DESYREL) tablet 25 mg  25 mg Oral QHS PRN Mansy, Arvella Merles, MD         Abtx:  Anti-infectives (From admission, onward)    Start     Dose/Rate Route Frequency Ordered Stop   12/20/21 1230  fluconazole (DIFLUCAN) IVPB 200 mg       See Hyperspace for full Linked Orders Report.   200 mg 100 mL/hr over 60 Minutes Intravenous Every 24 hours 12/19/21 1125     12/19/21 1215  fluconazole (DIFLUCAN) IVPB 200 mg       See Hyperspace for full Linked Orders Report.  200 mg 100 mL/hr over 60 Minutes Intravenous  Once 12/19/21 1125     12/18/21 2200  cefTRIAXone (ROCEPHIN) 2 g in sodium chloride 0.9 % 100 mL IVPB        2 g 200 mL/hr over 30 Minutes Intravenous Every 24 hours 12/18/21 0228     12/18/21 0030  cefTRIAXone (ROCEPHIN) 2 g in sodium chloride 0.9 % 100 mL IVPB        2 g 200 mL/hr over 30 Minutes Intravenous  Once 12/18/21 0021 12/18/21 0138       REVIEW OF SYSTEM Fever, chills, negative weight loss Eyes: negative diplopia or visual changes, negative eye pain ENT: negative coryza, negative sore throat Resp: negative cough, hemoptysis, dyspnea Cards: negative for chest pain, palpitations, lower extremity edema GU:  frequency, dysuria and flank pain GI: Negative for abdominal pain, diarrhea, bleeding, constipation Skin: negative for rash and pruritus Heme: negative for easy bruising and gum/nose bleeding MS: Weakness Neurolo:negative for headaches, dizziness, vertigo, memory problems  Psych: negative for feelings of anxiety, depression  Endocrine: diabetes Allergy/Immunology-tramadol Objective:  VITALS:  Patient Vitals for the past 24 hrs:  BP Temp Temp src Pulse Resp SpO2  12/19/21 1440 99/72 98.5 F (36.9 C) Oral 73 18 96 %  12/19/21 1139 127/81 (!) 102.9 F (39.4 C) Oral 88 18 97 %  12/19/21 0600 121/80 (!) 100.5 F (38.1 C) Oral 83 20 97 %  12/19/21 0400 107/71 100.1 F (37.8 C) Oral 91 (!) 22 95 %  12/19/21 0200 135/79 (!) 102.9  F (39.4 C) Oral 95 20 99 %    LDA Right nephrostomy PHYSICAL EXAM:  General: Alert, cooperative, no distress, appears stated age.  Head: Normocephalic, without obvious abnormality, atraumatic. Eyes: Conjunctivae clear, anicteric sclerae. Pupils are equal ENT Nares normal. No drainage or sinus tenderness. Lips, mucosa, and tongue normal. No Thrush Neck: Supple, symmetrical, no adenopathy, thyroid: non tender no carotid bruit and no JVD. Back: No CVA tenderness. Lungs: Bilateral air entry. Decreased bases. Heart: Regular rate and rhythm, no murmur, rub or gallop. Abdomen: Soft, non-tender,not distended. Bowel sounds normal. No masses Right nephrostomy tube Clear urine Extremities: atraumatic, no cyanosis. No edema. No clubbing Skin: No rashes or lesions. Or bruising Lymph: Cervical, supraclavicular normal. Neurologic: Grossly non-focal Pertinent Labs Lab Results CBC    Component Value Date/Time   WBC 9.7 12/18/2021 0322   RBC 3.71 (L) 12/18/2021 0322   HGB 10.9 (L) 12/18/2021 0322   HCT 34.3 (L) 12/18/2021 0322   PLT 158 12/18/2021 0322   MCV 92.5 12/18/2021 0322   MCH 29.4 12/18/2021 0322   MCHC 31.8 12/18/2021 0322   RDW 12.5 12/18/2021 0322   LYMPHSABS 1.3 11/06/2021 0743   MONOABS 0.4 11/06/2021 0743   EOSABS 0.1 11/06/2021 0743   BASOSABS 0.0 11/06/2021 0743       Latest Ref Rng & Units 12/18/2021    3:22 AM 12/17/2021    9:46 PM 11/06/2021    7:43 AM  CMP  Glucose 70 - 99 mg/dL 147  185  100   BUN 6 - 20 mg/dL 25  26  32   Creatinine 0.44 - 1.00 mg/dL 1.44  1.31  1.15   Sodium 135 - 145 mmol/L 138  136  136   Potassium 3.5 - 5.1 mmol/L 4.4  4.0  3.9   Chloride 98 - 111 mmol/L 111  108  113   CO2 22 - 32 mmol/L 22  19  17    Calcium 8.9 -  10.3 mg/dL 7.9  9.0  8.1   Total Protein 6.5 - 8.1 g/dL  9.0    Total Bilirubin 0.3 - 1.2 mg/dL  0.5    Alkaline Phos 38 - 126 U/L  95    AST 15 - 41 U/L  27    ALT 0 - 44 U/L  21        Microbiology: Recent  Results (from the past 240 hour(s))  Blood culture (single)     Status: None (Preliminary result)   Collection Time: 12/17/21  9:46 PM   Specimen: BLOOD  Result Value Ref Range Status   Specimen Description BLOOD LEFT ASSIST CONTROL  Final   Special Requests   Final    BOTTLES DRAWN AEROBIC AND ANAEROBIC Blood Culture adequate volume   Culture   Final    NO GROWTH 2 DAYS Performed at [facility], [address] [place], [place] [address]    Report Status PENDING  Incomplete  Urine Culture     Status: Abnormal   Collection Time: 12/17/21  9:46 PM   Specimen: Urine, Random  Result Value Ref Range Status   Specimen Description   Final    URINE, RANDOM Performed at [facility], [address] [place], [place] [address]    Special Requests   Final    NONE Performed at [facility], [address] [place], [place] [address]    Culture 80,000 COLONIES/mL YEAST (A)  Final   Report Status 12/19/2021 FINAL  Final  Blood culture (single)     Status: None (Preliminary result)   Collection Time: 12/18/21 12:56 AM   Specimen: BLOOD  Result Value Ref Range Status   Specimen Description BLOOD LEFT ASSIST CONTROL  Final   Special Requests   Final    BOTTLES DRAWN AEROBIC AND ANAEROBIC Blood Culture results may not be optimal due to an inadequate volume of blood received in culture bottles   Culture   Final    NO GROWTH 1 DAY Performed at [facility], [address], [place], [place] [address]    Report Status PENDING  Incomplete    IMAGING RESULTS:  I have personally reviewed the films Right hydronephrosis Impression/Recommendation Right hydronephrosis with perinephric stranding. A 3 mm stone obstructing the ureterovesicular junction.  Because of difficulty in placement of stent in the past she had underwent a nephrostomy Urine culture has been placed and.  It has been Candida albicans in May 2023.  She is currently on fluconazole She is  also on IV ceftriaxone We will send a urine culture from the nephrostomy tube. Because of  fever on  ceftriaxone will change  to Zosyn and increase the dose of fluconazole to 400mg   until we have the ID of the yeast.  Complicated urological history in the past Trauma to the bladder while undergoing vaginal hysterectomy and uterosacral ligament suspension, anterior colporrhaphy, transobturator mid urethral sling in December 2019 needing reimplantation of the right ureter and the bladder Since then there has been difficulty in starting the ureter  Bilateral nephrolithiasis  Diabetes mellitus on insulin ___________________________________________________ Discussed with patient, and pharmacist [facility] available by phone this weekend- call if needed Note:  This document was prepared using Dragon voice recognition software and may include unintentional dictation errors.

## 2021-12-19 NOTE — Progress Notes (Signed)
   12/19/21 0300  Mobility  HOB Elevated/Bed Position Self regulated  Activity Ambulated independently to bathroom  Range of Motion/Exercises Active;All extremities  Level of Assistance Standby assist, set-up cues, supervision of patient - no hands on  Assistive Device None  Distance Ambulated (ft) 20 ft  Activity Response Tolerated well

## 2021-12-19 NOTE — Progress Notes (Signed)
   12/19/21 2000  Mobility  HOB Elevated/Bed Position Self regulated  Activity Ambulated independently to bathroom;Ambulated independently in room  Range of Motion/Exercises Active;All extremities  Level of Assistance Standby assist, set-up cues, supervision of patient - no hands on  Assistive Device None  Distance Ambulated (ft) 20 ft  Activity Response Tolerated well

## 2021-12-19 NOTE — Consult Note (Signed)
Pharmacy Antibiotic Note  Lindsay Dean is a 56 y.o. female admitted on 12/17/2021 with  candiduria .  Pharmacy has been consulted for fluconazole dosing.  Plan: Adjust fluconazole dose to 200mg  IV q24h - give additional dose today to give 400mg  today.   - If SCr improves and CrCl >= 29ml/min then increase dose to 400mg  daily since likely kidney involvement.  - f/u renal funcion - f/u cultures  Temp (24hrs), Avg:101.4 F (38.6 C), Min:98.5 F (36.9 C), Max:103.2 F (39.6 C)  Recent Labs  Lab 12/17/21 2146 12/18/21 0322  WBC 11.4* 9.7  CREATININE 1.31* 1.44*  LATICACIDVEN 1.6  --     6/30 CrCl (adj) 39 ml/min calculated based on weight of 69.2kg (11/07/21) and height of 57 in.  CrCl cannot be calculated (Unknown ideal weight.).    Allergies  Allergen Reactions   Tramadol Nausea Only    Other reaction(s): Dizziness    Antimicrobials this admission: 6/30 Fluconazole >>  6/29 Ceftriaxone >>   Dose adjustments this admission:   Microbiology results: 6/28 BCx: NGTD 6/28 UCx: 80,000 colonies/mL yeast   Thank you for allowing pharmacy to be a part of this patient's care.  7/29 12/19/2021 3:45 PM

## 2021-12-20 DIAGNOSIS — E119 Type 2 diabetes mellitus without complications: Secondary | ICD-10-CM | POA: Diagnosis not present

## 2021-12-20 DIAGNOSIS — N201 Calculus of ureter: Secondary | ICD-10-CM | POA: Diagnosis not present

## 2021-12-20 DIAGNOSIS — A4150 Gram-negative sepsis, unspecified: Secondary | ICD-10-CM | POA: Diagnosis not present

## 2021-12-20 DIAGNOSIS — N179 Acute kidney failure, unspecified: Secondary | ICD-10-CM | POA: Diagnosis not present

## 2021-12-20 LAB — CREATININE, SERUM
Creatinine, Ser: 1.09 mg/dL — ABNORMAL HIGH (ref 0.44–1.00)
GFR, Estimated: >60 mL/min (ref >60)

## 2021-12-20 MED ORDER — LOSARTAN POTASSIUM 25 MG PO TABS
25.0000 mg | ORAL_TABLET | Freq: Every day | ORAL | Status: DC
  Administered 2021-12-20 – 2021-12-23 (×4): 25 mg via ORAL
  Filled 2021-12-20 (×4): qty 1

## 2021-12-20 MED ORDER — ACETAMINOPHEN 10 MG/ML IV SOLN
1000.0000 mg | Freq: Once | INTRAVENOUS | Status: AC
  Administered 2021-12-21: 1000 mg via INTRAVENOUS
  Filled 2021-12-20: qty 100

## 2021-12-20 NOTE — Consult Note (Signed)
Pharmacy Antibiotic Note  Lindsay Dean is a 56 y.o. female admitted on 12/17/2021 with  candiduria .  Pharmacy has been consulted for fluconazole dosing.  Plan: ID increased fluconazole to 400mg  IV q24h (Per ID note: Because of  fever on  ceftriaxone will change  to Zosyn and increase the dose of fluconazole to 400mg   until we have the ID of the yeast.)  - f/u renal function   - f/u cultures  Temp (24hrs), Avg:99.9 F (37.7 C), Min:98.1 F (36.7 C), Max:102.9 F (39.4 C)  Recent Labs  Lab 12/17/21 2146 12/18/21 0322 12/20/21 0656  WBC 11.4* 9.7  --   CREATININE 1.31* 1.44* 1.09*  LATICACIDVEN 1.6  --   --     6/30 CrCl (adj) 39 ml/min calculated based on weight of 69.2kg (11/07/21) and height of 57 in.  CrCl cannot be calculated (Unknown ideal weight.).    Allergies  Allergen Reactions   Tramadol Nausea Only    Other reaction(s): Dizziness    Antimicrobials this admission: 6/30 Fluconazole >>  6/29 Ceftriaxone >> 6/30 Zosyn 6/30>>  Dose adjustments this admission:   Microbiology results: 6/28 BCx: NGTD 6/28 UCx: 80,000 colonies/mL yeast   Thank you for allowing pharmacy to be a part of this patient's care.  Saralee Bolick A 12/20/2021 8:16 AM

## 2021-12-20 NOTE — Progress Notes (Signed)
[facility]  -  at [facility]   PATIENT NAME: Lindsay Dean    MR#:  [id number]  DATE OF BIRTH:  06-22-1966  SUBJECTIVE:   via video interpreter patient had low grade fever   VITALS:  Blood pressure 136/82, pulse 72, temperature 99.4 F (37.4 C), resp. rate 17, weight 69.8 kg, SpO2 97 %.  PHYSICAL EXAMINATION:   GENERAL:  56 y.o.-year-old patient lying in the bed with no acute distress.  LUNGS: Normal breath sounds bilaterally, no wheezing, rales, rhonchi.  CARDIOVASCULAR: S1, S2 normal. No murmurs, rubs, or gallops.  ABDOMEN: Soft, nontender, nondistended. Bowel sounds present.  EXTREMITIES: No  edema b/l.    NEUROLOGIC: nonfocal  patient is alert and awake SKIN: No obvious rash, lesion, or ulcer.   LABORATORY PANEL:  CBC Recent Labs  Lab 12/18/21 0322  WBC 9.7  HGB 10.9*  HCT 34.3*  PLT 158     Chemistries  Recent Labs  Lab 12/17/21 2146 12/18/21 0322 12/20/21 0656  NA 136 138  --   K 4.0 4.4  --   CL 108 111  --   CO2 19* 22  --   GLUCOSE 185* 147*  --   BUN 26* 25*  --   CREATININE 1.31* 1.44* 1.09*  CALCIUM 9.0 7.9*  --   AST 27  --   --   ALT 21  --   --   ALKPHOS 95  --   --   BILITOT 0.5  --   --     Cardiac Enzymes No results for input(s): "TROPONINI" in the last 168 hours. RADIOLOGY:  IR NEPHROSTOMY PLACEMENT RIGHT  Result Date: 12/18/2021 INDICATION: 56 year old female with a history of right-sided stone disease complicated by right distal ureteral stricture requiring ureteral reimplantation. She presents with recurrent distal right UVJ stone with obstruction and infection. She is not a good candidate for double-J stent placement and is therefore referred to interventional radiology for percutaneous nephrostomy tube placement. EXAM: IR NEPHROSTOMY PLACEMENT RIGHT COMPARISON:  None Available. MEDICATIONS: In patient currently on Rocephin. This is adequate antibiotic coverage and no additional prophylaxis was  administered. ANESTHESIA/SEDATION: Fentanyl 25 mcg IV; Versed 1 mg IV Moderate Sedation Time:  12 minutes The patient's vital signs and level of consciousness were continuously monitored during the procedure by the interventional radiology nurse under my direct supervision. CONTRAST:  4mL OMNIPAQUE IOHEXOL 350 MG/ML SOLN - administered into the collecting system(s) FLUOROSCOPY: Radiation exposure index: 6.6 mGy reference air kerma COMPLICATIONS: None immediate. TECHNIQUE: The procedure, risks, benefits, and alternatives were explained to the patient. Questions regarding the procedure were encouraged and answered. The patient understands and consents to the procedure. The right flank was prepped with chlorhexidine in a sterile fashion, and a sterile drape was applied covering the operative field. A sterile gown and sterile gloves were used for the procedure. Local anesthesia was provided with 1% Lidocaine. The right flank was interrogated with ultrasound and the left kidney identified. The kidney is hydronephrotic. A suitable access site on the skin overlying the lower pole, posterior calix was identified. After local mg anesthesia was achieved, a small skin nick was made with an 11 blade scalpel. A 21 gauge Accustick needle was then advanced under direct sonographic guidance into the lower pole of the right kidney. A 0.018 inch wire was advanced under fluoroscopic guidance into the left renal collecting system. The Accustick sheath was then advanced over the wire and a 0.018 system exchanged for a 0.035 system.  Gentle hand injection of contrast material confirms placement of the sheath within the renal collecting system. There is moderate hydronephrosis. The tract from the scan into the renal collecting system was then dilated serially to 10-French. A 10-French Cook all-purpose drain was then placed and positioned under fluoroscopic guidance. The locking loop is well formed within the left renal pelvis. The catheter  was secured to the skin with 0 Prolene and a sterile bandage was placed. Catheter was left to gravity bag drainage. IMPRESSION: Successful placement of a right 10 French percutaneous nephrostomy tube. Electronically Signed   By: Malachy Moan M.D.   On: 12/18/2021 16:38    Assessment and Plan  Lindsay Dean is a 56 y.o. Hispanic female with medical history significant for type 2 diabetes mellitus, hypertension and SLE, who presented to the ER with acute onset of suprapubic abdominal pain as well as right flank pain, fever and chills with associated dysuria, urinary frequency and urgency.  Imaging: Renal stone CT showed the following: 1. Obstructing 3 mm calculus at the right ureterovesicular junction resulting in moderate right hydronephrosis and perinephric stranding. 2. Minimal nonobstructing left nephrolithiasis. Interval removal of left double-J ureteral stent. 3. Moderate to severe pancolonic diverticulosis without superimposed acute inflammatory change.  Sepsis secondary to UTI in the setting of right obstructive uropathy. -- IV fluids -- IV Rocephin now changed to Zosyn per ID -- blood culture negative -- urine culture growing 80,000 yeast -- urology consult appreciated-- Dr. Lonna Cobb recommended IR place right nephrostomy tube given complexity from previous procedures -- status post right nephrostomy tube placement on 12/18/2021 -- patient continues to spike high-grade fever. --  consult ID appreciated. Increased dose of Fluconazole  --pending ID of the yeast. Has h/o candida albicans  acute kidney injury superimposed on chronic kidney disease IIIa -- recieved IV fluids -- resume hydrochlorothiazide/Cozaar since creat better --1.44--1.0  type II diabetes with CKD, sugars well-controlled -- continue sliding scale insulin  Gerd without esophagitis -- continue PPI  Hypertension -- continue atenolol -- resume hydrochlorothiazide and Cozaar    Procedures: right  nephrostomy tube by IR on 6/29 Family communication :none today Consults : urology ID, IR CODE STATUS: full DVT Prophylaxis : Lovenox Level of care: Med-Surg Status is: Inpatient Remains inpatient appropriate because: continues with fever. ID consultation placed  anticipate discharge 1 to 3 days    TOTAL TIME TAKING CARE OF THIS PATIENT: 35 minutes.  >50% time spent on counselling and coordination of care  Note: This dictation was prepared with Dragon dictation along with smaller phrase technology. Any transcriptional errors that result from this process are unintentional.  Enedina Finner M.D    [facility]   CC: Primary care physician; Neita Goodnight, Presley Raddle, MD

## 2021-12-21 DIAGNOSIS — E119 Type 2 diabetes mellitus without complications: Secondary | ICD-10-CM | POA: Diagnosis not present

## 2021-12-21 DIAGNOSIS — N179 Acute kidney failure, unspecified: Secondary | ICD-10-CM | POA: Diagnosis not present

## 2021-12-21 DIAGNOSIS — A4150 Gram-negative sepsis, unspecified: Secondary | ICD-10-CM | POA: Diagnosis not present

## 2021-12-21 DIAGNOSIS — N201 Calculus of ureter: Secondary | ICD-10-CM | POA: Diagnosis not present

## 2021-12-21 LAB — BASIC METABOLIC PANEL
Anion gap: 5 (ref 5–15)
BUN: 16 mg/dL (ref 6–20)
CO2: 21 mmol/L — ABNORMAL LOW (ref 22–32)
Calcium: 8 mg/dL — ABNORMAL LOW (ref 8.9–10.3)
Chloride: 111 mmol/L (ref 98–111)
Creatinine, Ser: 1.1 mg/dL — ABNORMAL HIGH (ref 0.44–1.00)
GFR, Estimated: >60 mL/min (ref >60)
Glucose, Bld: 88 mg/dL (ref 70–99)
Potassium: 3.5 mmol/L (ref 3.5–5.1)
Sodium: 137 mmol/L (ref 135–145)

## 2021-12-21 LAB — URINE CULTURE
Culture: 80000 — AB
Culture: 10000 — AB

## 2021-12-21 MED ORDER — ORAL CARE MOUTH RINSE: 15.0000 mL | OROMUCOSAL | Status: DC | PRN

## 2021-12-21 NOTE — Progress Notes (Signed)
[facility]  - [place] at [facility]   PATIENT NAME: Lindsay Dean    MR#:  [id number]  DATE OF BIRTH:  10-19-1965  SUBJECTIVE:   via video interpreter Sitting in the chair--feels better at present. Cont with low grade fever   VITALS:  Blood pressure (!) 150/74, pulse 74, temperature 98.1 F (36.7 C), temperature source Oral, resp. rate (!) 24, weight 69.8 kg, SpO2 100 %.  PHYSICAL EXAMINATION:   GENERAL:  56 y.o.-year-old patient lying in the bed with no acute distress.  LUNGS: Normal breath sounds bilaterally, no wheezing, rales, rhonchi.  CARDIOVASCULAR: S1, S2 normal. No murmurs  ABDOMEN: Soft, nontender, nondistended. Bowel sounds present.  Right Nephrostomy tube + EXTREMITIES: No  edema b/l.    NEUROLOGIC: nonfocal  patient is alert and awake   LABORATORY PANEL:  CBC Recent Labs  Lab 12/18/21 0322  WBC 9.7  HGB 10.9*  HCT 34.3*  PLT 158     Chemistries  Recent Labs  Lab 12/17/21 2146 12/18/21 0322 12/21/21 0653  NA 136   < > 137  K 4.0   < > 3.5  CL 108   < > 111  CO2 19*   < > 21*  GLUCOSE 185*   < > 88  BUN 26*   < > 16  CREATININE 1.31*   < > 1.10*  CALCIUM 9.0   < > 8.0*  AST 27  --   --   ALT 21  --   --   ALKPHOS 95  --   --   BILITOT 0.5  --   --    < > = values in this interval not displayed.    Cardiac Enzymes No results for input(s): "TROPONINI" in the last 168 hours. RADIOLOGY:  No results found.  Assessment and Plan  Lindsay Dean is a 56 y.o. Hispanic female with medical history significant for type 2 diabetes mellitus, hypertension and SLE, who presented to the ER with acute onset of suprapubic abdominal pain as well as right flank pain, fever and chills with associated dysuria, urinary frequency and urgency.  Imaging: Renal stone CT showed the following: 1. Obstructing 3 mm calculus at the right ureterovesicular junction resulting in moderate right hydronephrosis and perinephric stranding. 2.  Minimal nonobstructing left nephrolithiasis. Interval removal of left double-J ureteral stent. 3. Moderate to severe pancolonic diverticulosis without superimposed acute inflammatory change.  Sepsis secondary to UTI in the setting of right obstructive uropathy. -- blood culture negative -- urine culture growing 80,000 yeast -- urology consult appreciated-- Dr. Lonna Cobb recommended IR place right nephrostomy tube given complexity from previous procedures -- status post right nephrostomy tube placement on 12/18/2021 -- patient continues to spike high-grade fever. --  consult ID appreciated. Increased dose of Fluconazole  --pending ID of the yeast. Has h/o candida albicans --IV Zosyn + IV fluconazole  acute kidney injury superimposed on chronic kidney disease IIIa -- recieved IV fluids -- resume hydrochlorothiazide/Cozaar since creat better --1.44--1.0  type II diabetes with CKD, sugars well-controlled -- continue sliding scale insulin  Gerd without esophagitis -- continue PPI  Hypertension -- continue atenolol -- resume hydrochlorothiazide and Cozaar    Procedures: right nephrostomy tube by IR on 6/29 Family communication :none today Consults : urology ID, IR CODE STATUS: full DVT Prophylaxis : Lovenox Level of care: Med-Surg Status is: Inpatient Remains inpatient appropriate because: continues with fever. ID consultation placed  anticipate discharge 1 to 2 days    TOTAL TIME  TAKING CARE OF THIS PATIENT: 35 minutes.  >50% time spent on counselling and coordination of care  Note: This dictation was prepared with Dragon dictation along with smaller phrase technology. Any transcriptional errors that result from this process are unintentional.  Lindsay Dean M.D    [facility]   CC: Primary care physician; Neita Goodnight, Presley Raddle, MD

## 2021-12-21 NOTE — Progress Notes (Signed)
   12/20/21 2000  Assess: MEWS Score  Temp (!) 100.5 F (38.1 C)  BP (!) 157/86  MAP (mmHg) 108  Pulse Rate 86  Resp (!) 28  Level of Consciousness Alert  SpO2 99 %  O2 Device Room Air  Assess: MEWS Score  MEWS Temp 1  MEWS Systolic 0  MEWS Pulse 0  MEWS RR 2  MEWS LOC 0  MEWS Score 3  MEWS Score Color Yellow  Assess: if the MEWS score is Yellow or Red  Were vital signs taken at a resting state? Yes  Focused Assessment Change from prior assessment (see assessment flowsheet)  Does the patient meet 2 or more of the SIRS criteria? No  Does the patient have a confirmed or suspected source of infection? Yes  Provider and Rapid Response Notified? No (provider notified)  MEWS guidelines implemented *See Row Information* Yes  Treat  MEWS Interventions Administered prn meds/treatments;Escalated (See documentation below)  Pain Scale 0-10  Pain Score 8  Pain Type Acute pain  Pain Location Generalized  Pain Descriptors / Indicators Aching  Pain Intervention(s) Medication (See eMAR)  Take Vital Signs  Increase Vital Sign Frequency  Yellow: Q 2hr X 2 then Q 4hr X 2, if remains yellow, continue Q 4hrs  Escalate  MEWS: Escalate Yellow: discuss with charge nurse/RN and consider discussing with provider and RRT  Notify: Charge Nurse/RN  Name of Charge Nurse/RN Notified Phyllis, RN  Date Charge Nurse/RN Notified 12/20/21  Time Charge Nurse/RN Notified 2005  Notify: Provider  Provider Name/Title Manuela Schwartz, NP  Date Provider Notified 12/20/21  Time Provider Notified 2020  Method of Notification  (secure chat)  Notification Reason Change in status  Provider response No new orders  Date of Provider Response 12/21/21  Time of Provider Response 2025  Assess: SIRS CRITERIA  SIRS Temperature  0  SIRS Pulse 0  SIRS Respirations  1  SIRS WBC 0  SIRS Score Sum  1

## 2021-12-21 NOTE — Progress Notes (Signed)
   12/20/21 2200  Mobility  HOB Elevated/Bed Position Self regulated  Activity Ambulated with assistance in room;Ambulated with assistance to bathroom  Range of Motion/Exercises Active;All extremities  Level of Assistance Standby assist, set-up cues, supervision of patient - no hands on  Assistive Device None  Distance Ambulated (ft) 25 ft  Activity Response Tolerated well

## 2021-12-22 ENCOUNTER — Inpatient Hospital Stay: Payer: Medicaid Other

## 2021-12-22 DIAGNOSIS — Q6211 Congenital occlusion of ureteropelvic junction: Secondary | ICD-10-CM

## 2021-12-22 DIAGNOSIS — A4150 Gram-negative sepsis, unspecified: Secondary | ICD-10-CM | POA: Diagnosis not present

## 2021-12-22 DIAGNOSIS — N390 Urinary tract infection, site not specified: Secondary | ICD-10-CM | POA: Diagnosis not present

## 2021-12-22 LAB — CREATININE, SERUM
Creatinine, Ser: 1.05 mg/dL — ABNORMAL HIGH (ref 0.44–1.00)
GFR, Estimated: >60 mL/min (ref >60)

## 2021-12-22 LAB — CULTURE, BLOOD (SINGLE)
Culture: NO GROWTH
Special Requests: ADEQUATE

## 2021-12-22 NOTE — Progress Notes (Signed)
[facility]  - [place] at [facility]   PATIENT NAME: Lindsay Dean    MR#:  [id number]  DATE OF BIRTH:  02-24-1966  SUBJECTIVE:   via video interpreter Sitting in the chair--feels better at present.  No fever  today   VITALS:  Blood pressure (!) 149/85, pulse 66, temperature 98.9 F (37.2 C), resp. rate 20, weight 69.8 kg, SpO2 99 %.  PHYSICAL EXAMINATION:   GENERAL:  56 y.o.-year-old patient lying in the bed with no acute distress.  LUNGS: Normal breath sounds bilaterally, no wheezing, rales, rhonchi.  CARDIOVASCULAR: S1, S2 normal. No murmurs  ABDOMEN: Soft, nontender, nondistended. Bowel sounds present.  Right Nephrostomy tube + EXTREMITIES: No  edema b/l.    NEUROLOGIC: nonfocal  patient is alert and awake   LABORATORY PANEL:  CBC Recent Labs  Lab 12/18/21 0322  WBC 9.7  HGB 10.9*  HCT 34.3*  PLT 158     Chemistries  Recent Labs  Lab 12/17/21 2146 12/18/21 0322 12/21/21 0653 12/22/21 0310  NA 136   < > 137  --   K 4.0   < > 3.5  --   CL 108   < > 111  --   CO2 19*   < > 21*  --   GLUCOSE 185*   < > 88  --   BUN 26*   < > 16  --   CREATININE 1.31*   < > 1.10* 1.05*  CALCIUM 9.0   < > 8.0*  --   AST 27  --   --   --   ALT 21  --   --   --   ALKPHOS 95  --   --   --   BILITOT 0.5  --   --   --    < > = values in this interval not displayed.    Cardiac Enzymes No results for input(s): "TROPONINI" in the last 168 hours. RADIOLOGY:  No results found.  Assessment and Plan  Lindsay Dean is a 56 y.o. Hispanic female with medical history significant for type 2 diabetes mellitus, hypertension and SLE, who presented to the ER with acute onset of suprapubic abdominal pain as well as right flank pain, fever and chills with associated dysuria, urinary frequency and urgency.  Imaging: Renal stone CT showed the following: 1. Obstructing 3 mm calculus at the right ureterovesicular junction resulting in moderate right  hydronephrosis and perinephric stranding. 2. Minimal nonobstructing left nephrolithiasis. Interval removal of left double-J ureteral stent. 3. Moderate to severe pancolonic diverticulosis without superimposed acute inflammatory change.  Sepsis secondary to UTI in the setting of right obstructive uropathy. -- blood culture negative -- urine culture growing 80,000 yeast -- urology consult appreciated-- Dr. Lonna Cobb recommended IR place right nephrostomy tube given complexity from previous procedures -- status post right nephrostomy tube placement on 12/18/2021 -- patient continues to spike high-grade fever. --  consult ID appreciated. Increased dose of Fluconazole  --pending ID of the yeast. Has h/o candida albicans --IV Zosyn + IV fluconazole --Renal US ordered today  acute kidney injury superimposed on chronic kidney disease IIIa -- recieved IV fluids -- resume hydrochlorothiazide/Cozaar since creat better --1.44--1.0  type II diabetes with CKD, sugars well-controlled -- continue sliding scale insulin  Gerd without esophagitis -- continue PPI  Hypertension -- continue atenolol -- resume hydrochlorothiazide and Cozaar    Procedures: right nephrostomy tube by IR on 6/29 Family communication :none today Consults : urology ID, IR  CODE STATUS: full DVT Prophylaxis : Lovenox Level of care: Med-Surg Status is: Inpatient Remains inpatient appropriate because: Cont to improve--renal US to see if hydronephrosis resolves   TOTAL TIME TAKING CARE OF THIS PATIENT: 25 minutes.  >50% time spent on counselling and coordination of care  Note: This dictation was prepared with Dragon dictation along with smaller phrase technology. Any transcriptional errors that result from this process are unintentional.  Enedina Finner M.D    [facility]   CC: Primary care physician; Neita Goodnight, Presley Raddle, MD

## 2021-12-22 NOTE — Progress Notes (Addendum)
   Date of Admission:  12/17/2021     ID: Lindsay Dean is a 56 y.o. female  Principal Problem:   Sepsis due to gram-negative UTI [facility]) Active Problems:   Ureterolithiasis   Acute unilateral obstructive uropathy   Acute kidney injury superimposed on chronic kidney disease (HCC)   Essential hypertension   Dyslipidemia   GERD without esophagitis   Type 2 diabetes mellitus without complications (HCC)    Subjective: Doing better No fever in 24 hrs No pain rt flank Appetite better  Medications:   atenolol  50 mg Oral Daily   atorvastatin  20 mg Oral Daily   docusate sodium  100 mg Oral BID   enoxaparin (LOVENOX) injection  40 mg Subcutaneous Q24H   losartan  25 mg Oral Daily   pantoprazole  40 mg Oral Daily   tamsulosin  0.4 mg Oral Daily    Objective: Vital signs in last 24 hours: Temp:  [97.9 F (36.6 C)-100.9 F (38.3 C)] 98.9 F (37.2 C) (07/03 0818) Pulse Rate:  [65-75] 66 (07/03 0818) Resp:  [16-20] 20 (07/03 0818) BP: (118-156)/(72-85) 149/85 (07/03 0818) SpO2:  [99 %-100 %] 99 % (07/03 0818)   PHYSICAL EXAM:  General: Alert, cooperative, no distress, appears stated age.  Head: Normocephalic, without obvious abnormality, atraumatic. Eyes: Conjunctivae clear, anicteric sclerae. Pupils are equal ENT Nares normal. No drainage or sinus tenderness. Lips, mucosa, and tongue normal. No Thrush Neck: Supple, symmetrical, no adenopathy, thyroid: non tender no carotid bruit and no JVD. Back: No CVA tenderness. Lungs: Clear to auscultation bilaterally. No Wheezing or Rhonchi. No rales. Heart: Regular rate and rhythm, no murmur, rub or gallop. Abdomen: rt nephrostomy tube- clear urine Soft, non-tender,not distended. Bowel sounds normal. No masses Extremities: atraumatic, no cyanosis. No edema. No clubbing Skin: erythematous rash rt sid eof face philtrum   Lymph: Cervical, supraclavicular normal. Neurologic: Grossly non-focal  Lab Results Recent Labs     12/21/21 0653 12/22/21 0310  NA 137  --   K 3.5  --   CL 111  --   CO2 21*  --   BUN 16  --   CREATININE 1.10* 1.05*  Microbiology: 12/17/21- BC NG 12/17/21- UC - candida albicans Rt nephrostomy- Yeast    Assessment/Plan:  Rt hydronephrosis with pyelonephritis due to obstruction at the ureteral vesicular junction with 3 mm stone- S/p nephrostomy Urine culture and nephrostomy culture is candida albicans Fever resolved since increasing the dose of fluconazole to 400mg  Qd Pt is also on zosyn- will DC that Repeat shows resolution of hydronephrosis Pt on discharge will need PO fluconazole 400mg  for a total of 2 weeks- 01/02/22  Complicated urological history As above  DM on insulin I  Will follow her as OP Discussed the management with patient and the hospitalist  D willl follow her peripherally tomorrow- On call team available by phone

## 2021-12-23 DIAGNOSIS — N390 Urinary tract infection, site not specified: Secondary | ICD-10-CM | POA: Diagnosis not present

## 2021-12-23 DIAGNOSIS — A4150 Gram-negative sepsis, unspecified: Secondary | ICD-10-CM | POA: Diagnosis not present

## 2021-12-23 LAB — CULTURE, BLOOD (SINGLE): Culture: NO GROWTH

## 2021-12-23 LAB — CREATININE, SERUM
Creatinine, Ser: 0.87 mg/dL (ref 0.44–1.00)
GFR, Estimated: >60 mL/min (ref >60)

## 2021-12-23 MED ORDER — TAMSULOSIN HCL 0.4 MG PO CAPS: 0.4000 mg | ORAL_CAPSULE | Freq: Every day | ORAL | 0 refills | Status: DC

## 2021-12-23 MED ORDER — FLUCONAZOLE 100 MG PO TABS
400.0000 mg | ORAL_TABLET | Freq: Every day | ORAL | Status: DC
Start: 2021-12-23 — End: 2021-12-23
  Administered 2021-12-23: 400 mg via ORAL
  Filled 2021-12-23: qty 4

## 2021-12-23 MED ORDER — FLUCONAZOLE 200 MG PO TABS: 400.0000 mg | ORAL_TABLET | Freq: Every day | ORAL | 0 refills | Status: AC

## 2021-12-23 NOTE — Discharge Instructions (Addendum)
Right nephrostomy tube care per instruction.

## 2021-12-23 NOTE — Progress Notes (Signed)
Interpreter services used ID# [id number]. Shift assessment performed and education provided. Also reviewed nephrostomy care. Pt demonstrated and verbalized  appropriate care of nephrostomy. Pt demonstrated knowledge of nephrostomy care.

## 2021-12-23 NOTE — Discharge Summary (Signed)
Physician Discharge Summary   Patient: Lindsay Dean MRN: [id number] DOB: 04/28/1966  Admit date:     12/17/2021  Discharge date: 12/23/21  Discharge Physician: Enedina FinnerSona Deklynn Charlet   PCP: Preston Fleetingevelo, Adrian Mancheno, MD   Recommendations at discharge:   follow-up PCP in 1 to 2 week follow-up Dr. Lonna CobbStoioff urology post right nephrostomy tube placement  Discharge Diagnoses: Principal Problem:   Sepsis due to gram-negative UTI [facility](HCC) Active Problems:   Acute unilateral obstructive uropathy   Acute kidney injury superimposed on chronic kidney disease (HCC)   Ureterolithiasis   Type 2 diabetes mellitus without complications (HCC)   Essential hypertension   Dyslipidemia   GERD without esophagitis   Hospital Course: Lindsay Dean is a 56 y.o. Hispanic female with medical history significant for type 2 diabetes mellitus, hypertension and SLE, who presented to the ER with acute onset of suprapubic abdominal pain as well as right flank pain, fever and chills with associated dysuria, urinary frequency and urgency.   Imaging: Renal stone CT showed the following: 1. Obstructing 3 mm calculus at the right ureterovesicular junction resulting in moderate right hydronephrosis and perinephric stranding. 2. Minimal nonobstructing left nephrolithiasis. Interval removal of left double-J ureteral stent. 3. Moderate to severe pancolonic diverticulosis without superimposed acute inflammatory change.   Sepsis secondary to UTI in the setting of right obstructive uropathy. -- blood culture negative -- urine culture growing 80,000 yeast -- urology consult appreciated-- Dr. Lonna CobbStoioff recommended IR place right nephrostomy tube given complexity from previous procedures -- status post right nephrostomy tube placement on 12/18/2021 -- patient continues to spike high-grade fever. --  consult ID appreciated. Increased dose of Fluconazole  --pending ID of the yeast. Has h/o candida albicans --IV Zosyn + IV  fluconazole--->chang eto po fluconazole 400 mg qd for 14 days per ID --Renal US shows resolution fo right side hydronephrosis --pt will follow-up with Dr. Lonna CobbStoioff as outpatient --sepsis resolved   acute kidney injury superimposed on chronic kidney disease IIIa -- recieved IV fluids -- resume hydrochlorothiazide/Cozaar since creat better --1.44--1.0   type II diabetes with CKD, sugars well-controlled -- continue sliding scale insulin   Gerd without esophagitis -- continue PPI   Hypertension -- continue atenolol -- resume hydrochlorothiazide and Cozaar   hemodynamically stable. Will discharged to home. This was discussed via video interpreter. Patient is in agreement with plan.     Procedures: right nephrostomy tube by IR on 6/29 Consults : urology ID, IR CODE STATUS: full DVT Prophylaxis : Lovenox      Disposition: Home Diet recommendation:  Discharge Diet Orders (From admission, onward)     Start     Ordered   12/23/21 0000  Diet - low sodium heart healthy        12/23/21 0858           Cardiac and Carb modified diet DISCHARGE MEDICATION: Allergies as of 12/23/2021       Reactions   Tramadol Nausea Only   Other reaction(s): Dizziness        Medication List     STOP taking these medications    ciprofloxacin 500 MG tablet Commonly known as: CIPRO       TAKE these medications    atenolol 50 MG tablet Commonly known as: TENORMIN Take 50 mg by mouth daily.   atorvastatin 20 MG tablet Commonly known as: LIPITOR Take 20 mg by mouth daily.   docusate sodium 100 MG capsule Commonly known as: COLACE Take 1 capsule (100 mg total) by  mouth 2 (two) times daily.   fluconazole 200 MG tablet Commonly known as: DIFLUCAN Take 2 tablets (400 mg total) by mouth daily for 10 days.   hydrochlorothiazide 25 MG tablet Commonly known as: HYDRODIURIL Take 25 mg by mouth daily.   losartan 100 MG tablet Commonly known as: COZAAR Take 100 mg by mouth  daily.   omeprazole 20 MG capsule Commonly known as: PRILOSEC Take 20 mg by mouth daily.   tamsulosin 0.4 MG Caps capsule Commonly known as: FLOMAX Take 1 capsule (0.4 mg total) by mouth daily.               Discharge Care Instructions  (From admission, onward)           Start     Ordered   12/23/21 0000  Discharge wound care:       Comments: Right Nephrostomy care per instrucitons   12/23/21 0858            Follow-up Information     Revelo, Presley Raddle, MD. Call in 1 week(s).   Specialty: Family Medicine Why: pt to call and make appt Contact information: [address] [place] [address] [phone]         Riki Altes, MD. Schedule an appointment as soon as possible for a visit in 1 week(s).   Specialty: Urology Why: right nephrostomy tube f/u Contact information: [address] [place] [address] [phone]                Discharge Exam: Ceasar Mons Weights   12/20/21 0828  Weight: 69.8 kg     Condition at discharge: fair  The results of significant diagnostics from this hospitalization (including imaging, microbiology, ancillary and laboratory) are listed below for reference.   Imaging Studies: US RENAL  Result Date: 12/22/2021 CLINICAL DATA:  A 56 year old female presents for evaluation of hydronephrosis. EXAM: RENAL / URINARY TRACT ULTRASOUND COMPLETE COMPARISON:  December 18, 2021 CT evaluation. FINDINGS: Right Kidney: Renal measurements: 11.6 x 5.4 x 4.5 cm = volume: 148 mL. Renal cortical scarring. Resolution of hydronephrosis seen on previous imaging. Mildly diminished corticomedullary differentiation. Stent present in the interpolar RIGHT kidney. No perinephric fluid. Left Kidney: Renal measurements: 10.7 x 5.0 x 4.6 cm = volume: There is 128 mL. Cortical scarring. Small echogenic focus in the upper pole of the LEFT kidney without posterior acoustic shadowing measuring approximately 6 x 8 x 6 mm  not visible on previous CT. Also does not appear to correspond to a fatty lesion such as AML on the previous CT exam. Tiny renal calculi on the LEFT seen on previous CT imaging are not as well seen with a single renal calculus documented on the current study at 3 x 5 x 3 mm. Bladder: Under distended, not well assessed. Other: None IMPRESSION: Resolution of hydronephrosis post RIGHT renal stent placement. Bilateral renal cortical scarring with signs of LEFT-sided nephrolithiasis. Potential small LEFT upper pole renal lesion. Outside of the acute setting would suggest MRI with without contrast to exclude small renal neoplasm. Preferably as an outpatient when the patient is better able to hold their breath and cooperate for the evaluation. Electronically Signed   By: Donzetta Kohut M.D.   On: 12/22/2021 15:54   IR NEPHROSTOMY PLACEMENT RIGHT  Result Date: 12/18/2021 INDICATION: 56 year old female with a history of right-sided stone disease complicated by right distal ureteral stricture requiring ureteral reimplantation. She presents with recurrent distal right UVJ stone with obstruction and  infection. She is not a good candidate for double-J stent placement and is therefore referred to interventional radiology for percutaneous nephrostomy tube placement. EXAM: IR NEPHROSTOMY PLACEMENT RIGHT COMPARISON:  None Available. MEDICATIONS: In patient currently on Rocephin. This is adequate antibiotic coverage and no additional prophylaxis was administered. ANESTHESIA/SEDATION: Fentanyl 25 mcg IV; Versed 1 mg IV Moderate Sedation Time:  12 minutes The patient's vital signs and level of consciousness were continuously monitored during the procedure by the interventional radiology nurse under my direct supervision. CONTRAST:  39mL OMNIPAQUE IOHEXOL 350 MG/ML SOLN - administered into the collecting system(s) FLUOROSCOPY: Radiation exposure index: 6.6 mGy reference air kerma COMPLICATIONS: None immediate. TECHNIQUE: The  procedure, risks, benefits, and alternatives were explained to the patient. Questions regarding the procedure were encouraged and answered. The patient understands and consents to the procedure. The right flank was prepped with chlorhexidine in a sterile fashion, and a sterile drape was applied covering the operative field. A sterile gown and sterile gloves were used for the procedure. Local anesthesia was provided with 1% Lidocaine. The right flank was interrogated with ultrasound and the left kidney identified. The kidney is hydronephrotic. A suitable access site on the skin overlying the lower pole, posterior calix was identified. After local mg anesthesia was achieved, a small skin nick was made with an 11 blade scalpel. A 21 gauge Accustick needle was then advanced under direct sonographic guidance into the lower pole of the right kidney. A 0.018 inch wire was advanced under fluoroscopic guidance into the left renal collecting system. The Accustick sheath was then advanced over the wire and a 0.018 system exchanged for a 0.035 system. Gentle hand injection of contrast material confirms placement of the sheath within the renal collecting system. There is moderate hydronephrosis. The tract from the scan into the renal collecting system was then dilated serially to 10-French. A 10-French Cook all-purpose drain was then placed and positioned under fluoroscopic guidance. The locking loop is well formed within the left renal pelvis. The catheter was secured to the skin with 0 Prolene and a sterile bandage was placed. Catheter was left to gravity bag drainage. IMPRESSION: Successful placement of a right 10 French percutaneous nephrostomy tube. Electronically Signed   By: Malachy Moan M.D.   On: 12/18/2021 16:38   CT Renal Stone Study  Result Date: 12/18/2021 CLINICAL DATA:  Nausea, dysuria, flank pain EXAM: CT ABDOMEN AND PELVIS WITHOUT CONTRAST TECHNIQUE: Multidetector CT imaging of the abdomen and pelvis  was performed following the standard protocol without IV contrast. RADIATION DOSE REDUCTION: This exam was performed according to the departmental dose-optimization program which includes automated exposure control, adjustment of the mA and/or kV according to patient size and/or use of iterative reconstruction technique. COMPARISON:  11/04/2021 FINDINGS: Lower chest: The visualized lung bases are clear. The visualized heart pericardium are unremarkable. Small hiatal hernia. Hepatobiliary: No focal liver abnormality is seen. Status post cholecystectomy. No biliary dilatation. Pancreas: Unremarkable Spleen: Unremarkable Adrenals/Urinary Tract: The adrenal glands are unremarkable. The kidneys are normal in size and position. Previously noted 3 mm calculus within the lower pole the right kidney has now migrated into the right ureterovesicular junction and resultant obstruction with moderate right hydronephrosis and perinephric stranding. No superimposed residual nephrolithiasis. No perinephric fluid collections are identified. Left double-J ureteral stent has been removed. Punctate 1-2 mm nonobstructing calculi are again seen within the upper pole the left kidney, unchanged. No hydronephrosis or ureteral calculi on the left. The bladder is unremarkable. Stomach/Bowel: Moderate to severe pancolonic  diverticulosis without superimposed acute inflammatory change. Stomach, small bowel, and large bowel are otherwise unremarkable. No evidence of obstruction or focal inflammation. Appendix normal. No free intraperitoneal gas or fluid. Vascular/Lymphatic: No significant vascular findings are present within the abdomen and pelvis. There is extensive arteriosclerosis of the visualized lower extremity arterial outflow, not well assessed on this noncontrast examination. No aortic aneurysm. No enlarged abdominal or pelvic lymph nodes. Reproductive: Status post hysterectomy. No adnexal masses. Other: No abdominal wall hernia.  Musculoskeletal: Stable grade 2 anterolisthesis L4-5 with associated degenerative changes at the lumbosacral junction. No acute bone abnormality. IMPRESSION: 1. Obstructing 3 mm calculus at the right ureterovesicular junction resulting in moderate right hydronephrosis and perinephric stranding. 2. Minimal nonobstructing left nephrolithiasis. Interval removal of left double-J ureteral stent. 3. Moderate to severe pancolonic diverticulosis without superimposed acute inflammatory change. Electronically Signed   By: Fidela Salisbury M.D.   On: 12/18/2021 00:47    Microbiology: Results for orders placed or performed during the hospital encounter of 12/17/21  Blood culture (single)     Status: None   Collection Time: 12/17/21  9:46 PM   Specimen: BLOOD  Result Value Ref Range Status   Specimen Description BLOOD LEFT ASSIST CONTROL  Final   Special Requests   Final    BOTTLES DRAWN AEROBIC AND ANAEROBIC Blood Culture adequate volume   Culture   Final    NO GROWTH 5 DAYS Performed at [facility], [address] [place], [place] [address]    Report Status 12/22/2021 FINAL  Final  Urine Culture     Status: Abnormal   Collection Time: 12/17/21  9:46 PM   Specimen: Urine, Random  Result Value Ref Range Status   Specimen Description   Final    URINE, RANDOM Performed at [facility], [address], [place], [place] [address]    Special Requests   Final    NONE Performed at [facility], [address] [place], [place] [address]    Culture 80,000 COLONIES/mL [place] (A)  Final   Report Status 12/21/2021 FINAL  Final  Blood culture (single)     Status: None   Collection Time: 12/18/21 12:56 AM   Specimen: BLOOD  Result Value Ref Range Status   Specimen Description BLOOD LEFT ASSIST CONTROL  Final   Special Requests   Final    BOTTLES DRAWN AEROBIC AND ANAEROBIC Blood Culture results may not be optimal due to an inadequate volume of blood received in  culture bottles   Culture   Final    NO GROWTH 5 DAYS Performed at [facility], [address], [place], [place] [address]    Report Status 12/23/2021 FINAL  Final  Urine Culture     Status: Abnormal   Collection Time: 12/19/21  1:23 PM   Specimen: Urine, Random  Result Value Ref Range Status   Specimen Description   Final    URINE, RANDOM Performed at [facility], [address] [place], [place] [address]    Special Requests   Final    NONE Performed at [facility], [address] [place],  [address]    Culture 10,000 COLONIES/mL [place] (A)  Final   Report Status 12/21/2021 FINAL  Final    Labs: CBC: Recent Labs  Lab 12/17/21 2146 12/18/21 0322  WBC 11.4* 9.7  HGB 11.7* 10.9*  HCT 35.9* 34.3*  MCV 90.7 92.5  PLT 199 0000000   Basic Metabolic Panel: Recent Labs  Lab  12/17/21 2146 12/18/21 0322 12/20/21 0656 12/21/21 0653 12/22/21 0310 12/23/21 0332  NA 136 138  --  137  --   --   K 4.0 4.4  --  3.5  --   --   CL 108 111  --  111  --   --   CO2 19* 22  --  21*  --   --   GLUCOSE 185* 147*  --  88  --   --   BUN 26* 25*  --  16  --   --   CREATININE 1.31* 1.44* 1.09* 1.10* 1.05* 0.87  CALCIUM 9.0 7.9*  --  8.0*  --   --    Liver Function Tests: Recent Labs  Lab 12/17/21 2146  AST 27  ALT 21  ALKPHOS 95  BILITOT 0.5  PROT 9.0*  ALBUMIN 3.6   CBG: Recent Labs  Lab 12/18/21 1433 12/19/21 0810 12/19/21 1227  GLUCAP 91 101* 119*    Discharge time spent: greater than 30 minutes.  Signed: Fritzi Mandes, MD [facility] 12/23/2021

## 2021-12-25 ENCOUNTER — Other Ambulatory Visit: Payer: Self-pay | Admitting: Internal Medicine

## 2021-12-29 ENCOUNTER — Other Ambulatory Visit: Payer: Self-pay | Admitting: Physician Assistant

## 2021-12-29 ENCOUNTER — Ambulatory Visit (INDEPENDENT_AMBULATORY_CARE_PROVIDER_SITE_OTHER): Payer: Medicaid Other | Admitting: Urology

## 2021-12-29 ENCOUNTER — Ambulatory Visit
Admission: RE | Admit: 2021-12-29 | Discharge: 2021-12-29 | Disposition: A | Discharge status: Home / Self Care | Payer: Medicaid Other | Source: Ambulatory Visit | Attending: Physician Assistant | Admitting: Physician Assistant

## 2021-12-29 ENCOUNTER — Encounter: Payer: Self-pay | Admitting: Urology

## 2021-12-29 ENCOUNTER — Ambulatory Visit: Payer: Medicaid Other

## 2021-12-29 VITALS — BP 144/91 | HR 92 | Ht <= 58 in | Wt 149.0 lb

## 2021-12-29 DIAGNOSIS — N139 Obstructive and reflux uropathy, unspecified: Secondary | ICD-10-CM

## 2021-12-29 DIAGNOSIS — Z87442 Personal history of urinary calculi: Secondary | ICD-10-CM

## 2021-12-29 DIAGNOSIS — N1330 Unspecified hydronephrosis: Secondary | ICD-10-CM | POA: Diagnosis not present

## 2021-12-29 DIAGNOSIS — Z936 Other artificial openings of urinary tract status: Secondary | ICD-10-CM | POA: Insufficient documentation

## 2021-12-29 DIAGNOSIS — N200 Calculus of kidney: Secondary | ICD-10-CM

## 2021-12-29 HISTORY — PX: IR NEPHRO TUBE REMOV/FL: IMG2342

## 2021-12-29 MED ORDER — IOHEXOL 350 MG/ML SOLN
10.0000 mL | Freq: Once | INTRAVENOUS | Status: AC | PRN
  Administered 2021-12-29: 10 mL

## 2021-12-29 NOTE — Progress Notes (Signed)
12/29/2021 11:37 AM   Lindsay Dean March 08, 1966 [id number]  Referring provider: Preston Fleeting, MD [address] [place],  [place] [address]  Chief Complaint  Patient presents with   Other    Urologic history: 1.  Recurrent nephrolithiasis UTI/sepsis with right hydronephrosis felt secondary to punctate stone Prior history of cystotomy during a vaginal hysterectomy/suspension requiring right ureteral reimplant Taken for stent placement by Dr. Richardo Hanks and right UO could not be identified and subsequently underwent percutaneous nephrostomy Ureteroscopic removal of a 6 mm left distal ureteral calculus 10/07/2021 Admission 12/18/2021 for sepsis and a 3 mm right distal ureteral calculus.  She elected PCN placement and lieu of reattempt at stent placement  HPI: 56 y.o. female presents for follow-up visit.  A [employer] Spanish interpreter was present during this visit.  I was contacted by IR earlier this morning stating there was no evidence of a ureteral calculus on nephrostogram and her nephrostomy tube was removed She has no complaints   PMH: Past Medical History:  Diagnosis Date   Diabetes mellitus without complication (HCC)    taken off meds in 2017   Hypertension    Lupus (HCC)     Surgical History: Past Surgical History:  Procedure Laterality Date   ABDOMINAL HYSTERECTOMY     CYSTOSCOPY W/ RETROGRADES Left 10/07/2021   Procedure: CYSTOSCOPY WITH RETROGRADE PYELOGRAM;  Surgeon: Riki Altes, MD;  Location: [facility];  Service: Urology;  Laterality: Left;   CYSTOSCOPY WITH STENT PLACEMENT Right 05/04/2019   Procedure: CYSTOSCOPY with attempt of stent placement;  Surgeon: Sondra Come, MD;  Location: [facility];  Service: Urology;  Laterality: Right;   CYSTOSCOPY/URETEROSCOPY/HOLMIUM LASER/STENT PLACEMENT Left 10/07/2021   Procedure: CYSTOSCOPY/URETEROSCOPY/HOLMIUM LASER/STENT PLACEMENT;  Surgeon: Riki Altes, MD;  Location: [facility];   Service: Urology;  Laterality: Left;   IR NEPHRO TUBE REMOV/FL  12/29/2021   IR NEPHROSTOMY PLACEMENT RIGHT  12/18/2021    Home Medications:  Allergies as of 12/29/2021       Reactions   Tramadol Nausea Only   Other reaction(s): Dizziness        Medication List        Accurate as of December 29, 2021 11:37 AM. If you have any questions, ask your nurse or doctor.          atenolol 50 MG tablet Commonly known as: TENORMIN Take 50 mg by mouth daily.   atorvastatin 20 MG tablet Commonly known as: LIPITOR Take 20 mg by mouth daily.   docusate sodium 100 MG capsule Commonly known as: COLACE Take 1 capsule (100 mg total) by mouth 2 (two) times daily.   fluconazole 200 MG tablet Commonly known as: DIFLUCAN Take 2 tablets (400 mg total) by mouth daily for 10 days.   hydrochlorothiazide 25 MG tablet Commonly known as: HYDRODIURIL Take 25 mg by mouth daily.   losartan 100 MG tablet Commonly known as: COZAAR Take 100 mg by mouth daily.   omeprazole 20 MG capsule Commonly known as: PRILOSEC Take 20 mg by mouth daily.   tamsulosin 0.4 MG Caps capsule Commonly known as: FLOMAX Take 1 capsule (0.4 mg total) by mouth daily.        Allergies:  Allergies  Allergen Reactions   Tramadol Nausea Only    Other reaction(s): Dizziness    Family History: Family History  Problem Relation Age of Onset   Diabetes Mother    Heart disease Mother    Hypertension Mother    Arthritis  Mother    Hypertension Father    Lung disease Father     Social History:  reports that she has never smoked. She has never used smokeless tobacco. She reports that she does not drink alcohol and does not use drugs.   Physical Exam: BP (!) 144/91   Pulse 92   Ht 4\' 9"  (1.448 m)   Wt 149 lb (67.6 kg)   BMI 32.24 kg/m   Constitutional:  Alert and oriented, No acute distress. HEENT: [place] AT Respiratory: Normal respiratory effort, no increased work of breathing. Psychiatric: Normal mood and  affect.   Assessment & Plan:    1.  Recurrent nephrolithiasis Instructed to call for fever/flank pain post nephrostomy removal 6 month follow-up with KUB Metabolic stone evaluation was discussed   , MD  [facility] [address], [address] [place], [place] [phone]

## 2022-01-06 ENCOUNTER — Ambulatory Visit: Payer: Medicaid Other | Attending: Infectious Diseases | Admitting: Infectious Diseases

## 2022-01-06 ENCOUNTER — Encounter: Payer: Self-pay | Admitting: Infectious Diseases

## 2022-01-06 VITALS — BP 117/81 | HR 69 | Temp 97.6°F | Ht 60.0 in | Wt 143.0 lb

## 2022-01-06 DIAGNOSIS — N139 Obstructive and reflux uropathy, unspecified: Secondary | ICD-10-CM | POA: Diagnosis not present

## 2022-01-06 DIAGNOSIS — E119 Type 2 diabetes mellitus without complications: Secondary | ICD-10-CM | POA: Diagnosis not present

## 2022-01-06 DIAGNOSIS — B3749 Other urogenital candidiasis: Secondary | ICD-10-CM | POA: Insufficient documentation

## 2022-01-06 DIAGNOSIS — N200 Calculus of kidney: Secondary | ICD-10-CM | POA: Diagnosis not present

## 2022-01-06 DIAGNOSIS — N390 Urinary tract infection, site not specified: Secondary | ICD-10-CM

## 2022-01-06 DIAGNOSIS — Z87442 Personal history of urinary calculi: Secondary | ICD-10-CM | POA: Diagnosis not present

## 2022-01-06 NOTE — Progress Notes (Signed)
NAME: Lindsay Dean  DOB: Aug 24, 1965  MRN: [id number]  Date/Time: 01/06/2022 10:03 AM  Subjective:   ?Pt seen with stratus video spanish interpretor Lindsay Dean is a 56 y.o. with a history of ureterolithiasis , acute unilateral obstuctive uropathy on the rt side, had rt nephrostomy, AKI , DM, is here for follow up after recent hospitalization 12/18/22-12/23/21 She had percutaneous nephrostomy for  obstructed rt obstructive uropathy She had candida albicans in urine culture and was discharged home on 12/23/21 on 2 weeks of fluconazole which she has finished  The nephrostomy tune was removed on 7/10 and she saw urologist as Op on 7/10 She is doing well No flank pain, no fever, dysuria, hematuria  Past Medical History:  Diagnosis Date   Diabetes mellitus without complication (HCC)    taken off meds in 2017   Hypertension    Lupus (HCC)     Past Surgical History:  Procedure Laterality Date   ABDOMINAL HYSTERECTOMY     CYSTOSCOPY W/ RETROGRADES Left 10/07/2021   Procedure: CYSTOSCOPY WITH RETROGRADE PYELOGRAM;  Surgeon: Riki Altes, MD;  Location: [facility];  Service: Urology;  Laterality: Left;   CYSTOSCOPY WITH STENT PLACEMENT Right 05/04/2019   Procedure: CYSTOSCOPY with attempt of stent placement;  Surgeon: Sondra Come, MD;  Location: [facility];  Service: Urology;  Laterality: Right;   CYSTOSCOPY/URETEROSCOPY/HOLMIUM LASER/STENT PLACEMENT Left 10/07/2021   Procedure: CYSTOSCOPY/URETEROSCOPY/HOLMIUM LASER/STENT PLACEMENT;  Surgeon: Riki Altes, MD;  Location: [facility];  Service: Urology;  Laterality: Left;   IR NEPHRO TUBE REMOV/FL  12/29/2021   IR NEPHROSTOMY PLACEMENT RIGHT  12/18/2021    Social History   Socioeconomic History   Marital status: Married    Spouse name: Not on file   Number of children: Not on file   Years of education: Not on file   Highest education level: Not on file  Occupational History   Not on file  Tobacco Use   Smoking status: Never    Smokeless tobacco: Never  Vaping Use   Vaping Use: Never used  Substance and Sexual Activity   Alcohol use: No   Drug use: No   Sexual activity: Not on file  Other Topics Concern   Not on file  Social History Narrative   Not on file   Social Determinants of Health   Financial Resource Strain: Not on file  Food Insecurity: Not on file  Transportation Needs: Not on file  Physical Activity: Not on file  Stress: Not on file  Social Connections: Not on file  Intimate Partner Violence: Not on file    Family History  Problem Relation Age of Onset   Diabetes Mother    Heart disease Mother    Hypertension Mother    Arthritis Mother    Hypertension Father    Lung disease Father    Allergies  Allergen Reactions   Tramadol Nausea Only    Other reaction(s): Dizziness   I? Current Outpatient Medications  Medication Sig Dispense Refill   atenolol (TENORMIN) 50 MG tablet Take 50 mg by mouth daily.     atorvastatin (LIPITOR) 20 MG tablet Take 20 mg by mouth daily.     docusate sodium (COLACE) 100 MG capsule Take 1 capsule (100 mg total) by mouth 2 (two) times daily. 10 capsule 0   hydrochlorothiazide (HYDRODIURIL) 25 MG tablet Take 25 mg by mouth daily.     losartan (COZAAR) 100 MG tablet Take 100 mg by mouth daily.     omeprazole (  PRILOSEC) 20 MG capsule Take 20 mg by mouth daily.     tamsulosin (FLOMAX) 0.4 MG CAPS capsule Take 1 capsule (0.4 mg total) by mouth daily. 30 capsule 0   No current facility-administered medications for this visit.     Abtx:  Anti-infectives (From admission, onward)    None       REVIEW OF SYSTEMS:  Const: negative fever, negative chills, negative weight loss Eyes: negative diplopia or visual changes, negative eye pain ENT: negative coryza, negative sore throat Resp: negative cough, hemoptysis, dyspnea Cards: negative for chest pain, palpitations, lower extremity edema GU: negative for frequency, dysuria and hematuria GI: Negative for  abdominal pain, diarrhea, bleeding, constipation Skin: negative for rash and pruritus Heme: negative for easy bruising and gum/nose bleeding MS: negative for myalgias, arthralgias, back pain and muscle weakness Neurolo:negative for headaches, dizziness, vertigo, memory problems  Psych: negative for feelings of anxiety, depression  Endocrine:  diabetes Allergy/Immunology- tramadol  Objective:  VITALS:  Ht 5' (1.524 m)   Wt 143 lb (64.9 kg)   BMI 27.93 kg/m   PHYSICAL EXAM:  General: Alert, cooperative, no distress, appears stated age.  Head: Normocephalic, without obvious abnormality, atraumatic. Eyes: Conjunctivae clear, anicteric sclerae. Pupils are equal ENT Nares normal. No drainage or sinus tenderness. Lips, mucosa, and tongue normal. No Thrush Neck: Supple, symmetrical, no adenopathy, thyroid: non tender no carotid bruit and no JVD. Back: No CVA tenderness. Lungs: Clear to auscultation bilaterally. No Wheezing or Rhonchi. No rales. Heart: Regular rate and rhythm, no murmur, rub or gallop. Abdomen: Soft, non-tender,not distended. Bowel sounds normal. No masses Scar rt flank- no tenderness Extremities: atraumatic, no cyanosis. No edema. No clubbing Skin: No rashes or lesions. Or bruising Lymph: Cervical, supraclavicular normal. Neurologic: Grossly non-focal  ? Impression/Recommendation ?candida albicans complicated UTI- has reoslved and she has completed 3 weeks of antifungal  Obstructive uropathy Rt- resolved S/p nephrostomy which has been removed Followed by urologist Checked with them regarding tamsulosin and they asked that she stop it   Nephrolithiasis  Prior history of cystotomy during a vaginal hysterectomy/suspension requiring right ureteral reimplant ? Diabetes- taken off meds  successfully in 2017. Last A1c in April was 5.9 ___________________________________________________ Discussed with patient, follow PRN

## 2022-01-06 NOTE — Patient Instructions (Addendum)
You are hetre for follow up of the complicated urinary tract infection due to renal stone obstruction and candida albicans. You have completed the treatment- I checked with urologist - you can stop tamsulosin. Follow PRN

## 2022-01-29 DIAGNOSIS — M3500 Sicca syndrome, unspecified: Secondary | ICD-10-CM | POA: Insufficient documentation

## 2022-02-13 ENCOUNTER — Ambulatory Visit: Payer: Medicaid Other | Admitting: Urology

## 2022-04-07 ENCOUNTER — Ambulatory Visit (INDEPENDENT_AMBULATORY_CARE_PROVIDER_SITE_OTHER): Payer: Medicaid Other | Admitting: Physician Assistant

## 2022-04-07 VITALS — BP 95/53 | HR 71 | Ht 60.0 in | Wt 143.0 lb

## 2022-04-07 DIAGNOSIS — R300 Dysuria: Secondary | ICD-10-CM

## 2022-04-07 LAB — URINALYSIS, COMPLETE
Bilirubin, UA: NEGATIVE
Glucose, UA: NEGATIVE
Ketones, UA: NEGATIVE
Nitrite, UA: NEGATIVE
Specific Gravity, UA: 1.025 (ref 1.005–1.030)
Urobilinogen, Ur: 0.2 mg/dL (ref 0.2–1.0)
pH, UA: 5.5 (ref 5.0–7.5)

## 2022-04-07 LAB — MICROSCOPIC EXAMINATION: WBC, UA: >30 /hpf — AB (ref 0–5)

## 2022-04-07 MED ORDER — SULFAMETHOXAZOLE-TRIMETHOPRIM 800-160 MG PO TABS: 1.0000 | ORAL_TABLET | Freq: Two times a day (BID) | ORAL | 0 refills | Status: AC

## 2022-04-07 NOTE — Progress Notes (Signed)
04/07/2022 3:04 PM   Lindsay Dean Oct 24, 1965 [id number]  CC: Chief Complaint  Patient presents with   Urinary Tract Infection   HPI: Lindsay Dean is a 56 y.o. female with PMH diabetes, recurrent stone disease, and recurrent urosepsis s/p right ureteral reimplant who presents today for evaluation of possible UTI.   Today she reports a 1 week history of dysuria and bladder pain.  She also reports some mild right flank pain and occasional nausea.  Tmax 99.7 F.  She denies chills and vomiting.  She had a prolonged stone episode 4-5 months ago associated with sepsis due to an obstructing distal right ureteral stone.  Notably, she has failed right ureteral stent placement in the past.  On review of her most recent CT stone study dated 12/18/2021, she is not believed to have any residual right-sided stones.  In-office UA today positive for 3+ blood, 2+ protein, and 2+ leukocytes; urine microscopy with >30 WBCs/HPF, 11-30 RBCs/HPF, and moderate bacteria.  PMH: Past Medical History:  Diagnosis Date   Diabetes mellitus without complication (HCC)    taken off meds in 2017   Hypertension    Lupus (HCC)     Surgical History: Past Surgical History:  Procedure Laterality Date   ABDOMINAL HYSTERECTOMY     CYSTOSCOPY W/ RETROGRADES Left 10/07/2021   Procedure: CYSTOSCOPY WITH RETROGRADE PYELOGRAM;  Surgeon: Riki Altes, MD;  Location: [facility];  Service: Urology;  Laterality: Left;   CYSTOSCOPY WITH STENT PLACEMENT Right 05/04/2019   Procedure: CYSTOSCOPY with attempt of stent placement;  Surgeon: Sondra Come, MD;  Location: [facility];  Service: Urology;  Laterality: Right;   CYSTOSCOPY/URETEROSCOPY/HOLMIUM LASER/STENT PLACEMENT Left 10/07/2021   Procedure: CYSTOSCOPY/URETEROSCOPY/HOLMIUM LASER/STENT PLACEMENT;  Surgeon: Riki Altes, MD;  Location: [facility];  Service: Urology;  Laterality: Left;   IR NEPHRO TUBE REMOV/FL  12/29/2021   IR NEPHROSTOMY PLACEMENT RIGHT   12/18/2021    Home Medications:  Allergies as of 04/07/2022       Reactions   Tramadol Nausea Only   Other reaction(s): Dizziness        Medication List        Accurate as of April 07, 2022  3:04 PM. If you have any questions, ask your nurse or doctor.          STOP taking these medications    tamsulosin 0.4 MG Caps capsule Commonly known as: FLOMAX       TAKE these medications    atenolol 50 MG tablet Commonly known as: TENORMIN Take 50 mg by mouth daily.   atorvastatin 20 MG tablet Commonly known as: LIPITOR Take 20 mg by mouth daily.   docusate sodium 100 MG capsule Commonly known as: COLACE Take 1 capsule (100 mg total) by mouth 2 (two) times daily.   hydrochlorothiazide 25 MG tablet Commonly known as: HYDRODIURIL Take 25 mg by mouth daily.   hydroxychloroquine 200 MG tablet Commonly known as: PLAQUENIL Take 1 tablet by mouth daily.   losartan 100 MG tablet Commonly known as: COZAAR Take 100 mg by mouth daily.   omeprazole 20 MG capsule Commonly known as: PRILOSEC Take 20 mg by mouth daily.   sulfamethoxazole-trimethoprim 800-160 MG tablet Commonly known as: BACTRIM DS Take 1 tablet by mouth 2 (two) times daily for 7 days.        Allergies:  Allergies  Allergen Reactions   Tramadol Nausea Only    Other reaction(s): Dizziness    Family History: Family History  Problem Relation Age of Onset   Diabetes Mother    Heart disease Mother    Hypertension Mother    Arthritis Mother    Hypertension Father    Lung disease Father     Social History:   reports that she has never smoked. She has never used smokeless tobacco. She reports that she does not drink alcohol and does not use drugs.  Physical Exam: BP (!) 95/53   Pulse 71   Ht 5' (1.524 m)   Wt 143 lb (64.9 kg)   BMI 27.93 kg/m   Constitutional:  Alert and oriented, no acute distress, nontoxic appearing HEENT: [place], AT Cardiovascular: No clubbing, cyanosis, or  edema Respiratory: Normal respiratory effort, no increased work of breathing GU: No CVA tenderness Skin: No rashes, bruises or suspicious lesions Neurologic: Grossly intact, no focal deficits, moving all 4 extremities Psychiatric: Normal mood and affect  Laboratory Data: Results for orders placed or performed in visit on 04/07/22  Microscopic Examination   Urine  Result Value Ref Range   WBC, UA >30 (A) 0 - 5 /hpf   RBC, Urine 11-30 (A) 0 - 2 /hpf   Epithelial Cells (non renal) 0-10 0 - 10 /hpf   Bacteria, UA Moderate (A) None seen/Few  Urinalysis, Complete  Result Value Ref Range   Specific Gravity, UA 1.025 1.005 - 1.030   pH, UA 5.5 5.0 - 7.5   Color, UA Yellow Yellow   Appearance Ur Cloudy (A) Clear   Leukocytes,UA 2+ (A) Negative   Protein,UA 2+ (A) Negative/Trace   Glucose, UA Negative Negative   Ketones, UA Negative Negative   RBC, UA 3+ (A) Negative   Bilirubin, UA Negative Negative   Urobilinogen, Ur 0.2 0.2 - 1.0 mg/dL   Nitrite, UA Negative Negative   Microscopic Examination See below:    Assessment & Plan:   1. Dysuria Her UA appears grossly infected today.  On review of her recent imaging, I do not believe she has any residual right-sided stones so my concern for acute stone episode is low at this time.  I am treating her for acute cystitis with possible early upper tract involvement, though she has been afebrile.  We will start empiric Bactrim DS twice daily x7 days and I asked her to contact our clinic if she is not completely better on day 7, at which point we will continue antibiotics for total of 10 days.  She is in agreement with this plan.  We discussed return precautions today including fever greater than 101 F or persistent symptoms after 72 hours of antibiotics.  If she clinically worsens and is found to have right hydronephrosis, she will need to proceed with right nephrostomy tube placement given failed right ureteral stent in the past. - Urinalysis,  Complete - CULTURE, URINE COMPREHENSIVE - sulfamethoxazole-trimethoprim (BACTRIM DS) 800-160 MG tablet; Take 1 tablet by mouth 2 (two) times daily for 7 days.  Dispense: 14 tablet; Refill: 0  Return if symptoms worsen or fail to improve.  Debroah Loop, PA-C  [facility] [address], [place],  [address] [phone]

## 2022-04-11 LAB — CULTURE, URINE COMPREHENSIVE

## 2022-04-15 ENCOUNTER — Telehealth: Payer: Self-pay

## 2022-04-15 NOTE — Telephone Encounter (Signed)
Tried to called with interpreter on the line , not able to reach pt , vm wasn't set up to leave voicemail.

## 2022-04-15 NOTE — Telephone Encounter (Signed)
-----   Message from Debroah Loop, [place] sent at 04/14/2022  5:10 PM EDT ----- Can you please call her on the Spanish language line and ask how she's feeling? She grows yeast quite often, I'm wondering if she may be colonized with it. We may not need to treat.

## 2022-04-28 ENCOUNTER — Encounter: Payer: Self-pay | Admitting: *Deleted

## 2022-07-01 ENCOUNTER — Ambulatory Visit
Admission: RE | Admit: 2022-07-01 | Discharge: 2022-07-01 | Disposition: A | Discharge status: Home / Self Care | Payer: Medicaid Other | Source: Ambulatory Visit | Attending: Urology | Admitting: Urology

## 2022-07-01 ENCOUNTER — Ambulatory Visit (INDEPENDENT_AMBULATORY_CARE_PROVIDER_SITE_OTHER): Payer: Medicaid Other | Admitting: Urology

## 2022-07-01 ENCOUNTER — Encounter: Payer: Self-pay | Admitting: Urology

## 2022-07-01 VITALS — BP 137/84 | HR 89 | Ht 60.0 in | Wt 143.0 lb

## 2022-07-01 DIAGNOSIS — Z87442 Personal history of urinary calculi: Secondary | ICD-10-CM

## 2022-07-01 DIAGNOSIS — N200 Calculus of kidney: Secondary | ICD-10-CM | POA: Diagnosis present

## 2022-07-01 NOTE — Progress Notes (Signed)
07/01/2022 1:17 PM   Lindsay Dean 1965-07-04 [id number]  Referring provider: [name], MD [address] [place],  [place] [address]  Chief Complaint  Patient presents with   Nephrolithiasis    Urologic history: 1.  Recurrent nephrolithiasis UTI/sepsis with right hydronephrosis felt secondary to punctate stone Prior history of cystotomy during a vaginal hysterectomy/suspension requiring right ureteral reimplant Taken for stent placement by Dr. Diamantina Providence and right UO could not be identified and subsequently underwent percutaneous nephrostomy Ureteroscopic removal of a 6 mm left distal ureteral calculus 10/07/2021 Admission 12/18/2021 for sepsis and a 3 mm right distal ureteral calculus.  She elected PCN placement and lieu of reattempt at stent placement.  Stone passed on follow-up nephrostogram  HPI: 57 y.o. female presents for follow-up visit.  Lindsay Dean interpreter was present during this visit.  Doing well since last visit No bothersome LUTS Denies dysuria, gross hematuria Denies flank, abdominal or pelvic pain She did have a PA visit 04/07/2022 for possible UTI.  Urine grew Candida and felt to most likely represent colonization Symptoms resolved and she has recently been doing well   PMH: Past Medical History:  Diagnosis Date   Diabetes mellitus without complication ([place])    taken off meds in 2017   Hypertension    Lupus ([place])     Surgical History: Past Surgical History:  Procedure Laterality Date   ABDOMINAL HYSTERECTOMY     CYSTOSCOPY W/ RETROGRADES Left 10/07/2021   Procedure: CYSTOSCOPY WITH RETROGRADE PYELOGRAM;  Surgeon: Lindsay Sons, MD;  Location: [facility];  Service: Urology;  Laterality: Left;   CYSTOSCOPY WITH STENT PLACEMENT Right 05/04/2019   Procedure: CYSTOSCOPY with attempt of stent placement;  Surgeon: Lindsay Co, MD;  Location: [facility];  Service: Urology;  Laterality: Right;    CYSTOSCOPY/URETEROSCOPY/HOLMIUM LASER/STENT PLACEMENT Left 10/07/2021   Procedure: CYSTOSCOPY/URETEROSCOPY/HOLMIUM LASER/STENT PLACEMENT;  Surgeon: Lindsay Sons, MD;  Location: [facility];  Service: Urology;  Laterality: Left;   IR NEPHRO TUBE REMOV/FL  12/29/2021   IR NEPHROSTOMY PLACEMENT RIGHT  12/18/2021    Home Medications:  Allergies as of 07/01/2022       Reactions   Tramadol Nausea Only   Other reaction(s): Dizziness        Medication List        Accurate as of July 01, 2022  1:17 PM. If you have any questions, ask your nurse or doctor.          atenolol 50 MG tablet Commonly known as: TENORMIN Take 50 mg by mouth daily.   atorvastatin 20 MG tablet Commonly known as: LIPITOR Take 20 mg by mouth daily.   docusate sodium 100 MG capsule Commonly known as: COLACE Take 1 capsule (100 mg total) by mouth 2 (two) times daily.   hydrochlorothiazide 25 MG tablet Commonly known as: HYDRODIURIL Take 25 mg by mouth daily.   hydroxychloroquine 200 MG tablet Commonly known as: PLAQUENIL Take 1 tablet by mouth daily.   losartan 100 MG tablet Commonly known as: COZAAR Take 100 mg by mouth daily.   omeprazole 20 MG capsule Commonly known as: PRILOSEC Take 20 mg by mouth daily.        Allergies:  Allergies  Allergen Reactions   Tramadol Nausea Only    Other reaction(s): Dizziness    Family History: Family History  Problem Relation Age of Onset   Diabetes Mother    Heart disease Mother    Hypertension Mother    Arthritis  Mother    Hypertension Father    Lung disease Father     Social History:  reports that she has never smoked. She has never used smokeless tobacco. She reports that she does not drink alcohol and does not use drugs.   Physical Exam: BP 137/84   Pulse 89   Ht 5' (1.524 m)   Wt 143 lb (64.9 kg)   BMI 27.93 kg/m   Constitutional:  Alert and oriented, No acute distress. HEENT: [place] AT Respiratory: Normal respiratory effort, no  increased work of breathing. Psychiatric: Normal mood and affect.  Pertinent imaging: Images of a KUB performed today were personally reviewed and interpreted.  No calcifications identified suspicious for recurrent urinary tract stones   Assessment & Plan:    1.  Recurrent nephrolithiasis Discussed the importance of hydration to keep urine output >2.5 L/day Follow-up 1 year with [place], MD  [place] [address], [place], [place] [address] [phone]

## 2022-07-22 ENCOUNTER — Other Ambulatory Visit: Payer: Self-pay | Admitting: Nurse Practitioner

## 2022-07-22 DIAGNOSIS — Z1231 Encounter for screening mammogram for malignant neoplasm of breast: Secondary | ICD-10-CM

## 2022-08-23 ENCOUNTER — Emergency Department
Admission: EM | Admit: 2022-08-23 | Discharge: 2022-08-23 | Disposition: A | Discharge status: Home / Self Care | Payer: Medicaid Other | Attending: Emergency Medicine | Admitting: Emergency Medicine

## 2022-08-23 ENCOUNTER — Emergency Department: Payer: Medicaid Other

## 2022-08-23 ENCOUNTER — Other Ambulatory Visit: Payer: Self-pay

## 2022-08-23 ENCOUNTER — Encounter: Payer: Self-pay | Admitting: Intensive Care

## 2022-08-23 DIAGNOSIS — E119 Type 2 diabetes mellitus without complications: Secondary | ICD-10-CM | POA: Diagnosis not present

## 2022-08-23 DIAGNOSIS — N3090 Cystitis, unspecified without hematuria: Secondary | ICD-10-CM | POA: Diagnosis not present

## 2022-08-23 DIAGNOSIS — I10 Essential (primary) hypertension: Secondary | ICD-10-CM | POA: Insufficient documentation

## 2022-08-23 DIAGNOSIS — R1031 Right lower quadrant pain: Secondary | ICD-10-CM | POA: Diagnosis present

## 2022-08-23 LAB — URINALYSIS, ROUTINE W REFLEX MICROSCOPIC
Bilirubin Urine: NEGATIVE
Glucose, UA: NEGATIVE mg/dL
Ketones, ur: NEGATIVE mg/dL
Nitrite: NEGATIVE
Protein, ur: 30 mg/dL — AB
Specific Gravity, Urine: 1.016 (ref 1.005–1.030)
WBC, UA: >50 WBC/hpf (ref 0–5)
pH: 5 (ref 5.0–8.0)

## 2022-08-23 LAB — CBC WITH DIFFERENTIAL/PLATELET
Abs Immature Granulocytes: 0.03 10*3/uL (ref 0.00–0.07)
Basophils Absolute: 0 10*3/uL (ref 0.0–0.1)
Basophils Relative: 0 %
Eosinophils Absolute: 0 10*3/uL (ref 0.0–0.5)
Eosinophils Relative: 0 %
HCT: 38.1 % (ref 36.0–46.0)
Hemoglobin: 12.4 g/dL (ref 12.0–15.0)
Immature Granulocytes: 0 %
Lymphocytes Relative: 14 %
Lymphs Abs: 1.1 10*3/uL (ref 0.7–4.0)
MCH: 29.3 pg (ref 26.0–34.0)
MCHC: 32.5 g/dL (ref 30.0–36.0)
MCV: 90.1 fL (ref 80.0–100.0)
Monocytes Absolute: 0.8 10*3/uL (ref 0.1–1.0)
Monocytes Relative: 10 %
Neutro Abs: 5.9 10*3/uL (ref 1.7–7.7)
Neutrophils Relative %: 76 %
Platelets: 212 10*3/uL (ref 150–400)
RBC: 4.23 MIL/uL (ref 3.87–5.11)
RDW: 13 % (ref 11.5–15.5)
WBC: 7.9 10*3/uL (ref 4.0–10.5)
nRBC: 0 % (ref 0.0–0.2)

## 2022-08-23 LAB — COMPREHENSIVE METABOLIC PANEL
ALT: 21 U/L (ref 0–44)
AST: 27 U/L (ref 15–41)
Albumin: 3.6 g/dL (ref 3.5–5.0)
Alkaline Phosphatase: 96 U/L (ref 38–126)
Anion gap: 10 (ref 5–15)
BUN: 28 mg/dL — ABNORMAL HIGH (ref 6–20)
CO2: 23 mmol/L (ref 22–32)
Calcium: 9.1 mg/dL (ref 8.9–10.3)
Chloride: 104 mmol/L (ref 98–111)
Creatinine, Ser: 0.97 mg/dL (ref 0.44–1.00)
GFR, Estimated: >60 mL/min (ref >60)
Glucose, Bld: 92 mg/dL (ref 70–99)
Potassium: 4.4 mmol/L (ref 3.5–5.1)
Sodium: 137 mmol/L (ref 135–145)
Total Bilirubin: 0.5 mg/dL (ref 0.3–1.2)
Total Protein: 8.7 g/dL — ABNORMAL HIGH (ref 6.5–8.1)

## 2022-08-23 LAB — LIPASE, BLOOD: Lipase: 45 U/L (ref 11–51)

## 2022-08-23 MED ORDER — ONDANSETRON HCL 4 MG/2ML IJ SOLN
4.0000 mg | Freq: Once | INTRAMUSCULAR | Status: AC
  Administered 2022-08-23: 4 mg via INTRAVENOUS
  Filled 2022-08-23: qty 2

## 2022-08-23 MED ORDER — KETOROLAC TROMETHAMINE 15 MG/ML IJ SOLN
15.0000 mg | Freq: Once | INTRAMUSCULAR | Status: AC
  Administered 2022-08-23: 15 mg via INTRAVENOUS
  Filled 2022-08-23: qty 1

## 2022-08-23 MED ORDER — SODIUM CHLORIDE 0.9 % IV BOLUS
1000.0000 mL | Freq: Once | INTRAVENOUS | Status: AC
  Administered 2022-08-23: 1000 mL via INTRAVENOUS

## 2022-08-23 MED ORDER — CEPHALEXIN 500 MG PO CAPS: 500.0000 mg | ORAL_CAPSULE | Freq: Two times a day (BID) | ORAL | 0 refills | Status: AC

## 2022-08-23 NOTE — ED Triage Notes (Signed)
Interpretor on a stick used during triage. C/o right flank pain. Patient c/o burning during urination. Symptoms started 1 week ago

## 2022-08-23 NOTE — Discharge Instructions (Addendum)
Your CT scan today was okay.  Your labs show a urinary tract infection, but otherwise reassuring results.  Please take antibiotics as prescribed and follow-up with your doctor.

## 2022-08-23 NOTE — ED Provider Notes (Signed)
[facility] Provider Note    Event Date/Time   First MD Initiated Contact with Patient 08/23/22 1500     (approximate)   History   Chief Complaint: Abdominal Pain  Encounter completed with Spanish video interpreter HPI  Lindsay Dean is a 57 y.o. female with a history of hypertension diabetes lupus kidney stones obesity who comes the ED complaining of right flank pain radiating around to her right lower quadrant and suprapubic abdomen that started this morning.  Constant, no aggravating or alleviating factors.  No fever but does have some chills.  Also reports having urinary frequency urgency and dysuria for the past week.  Does have a history of kidney stones for which she was hospitalized last year.  Denies chest pain shortness of breath or dizziness.  No vomiting or diarrhea     Physical Exam   Triage Vital Signs: ED Triage Vitals [08/23/22 1412]  Enc Vitals Group     BP (!) 179/114     Pulse Rate 96     Resp 18     Temp 98 F (36.7 C)     Temp Source Oral     SpO2 100 %     Weight 150 lb (68 kg)     Height '4\' 9"'$  (1.448 m)     Head Circumference      Peak Flow      Pain Score 10     Pain Loc      Pain Edu?      Excl. in [place]?     Most recent vital signs: Vitals:   08/23/22 1412  BP: (!) 179/114  Pulse: 96  Resp: 18  Temp: 98 F (36.7 C)  SpO2: 100%    General: Awake, no distress.  CV:  Good peripheral perfusion.  Regular rate and rhythm Resp:  Normal effort.  Clear to auscultation bilaterally Abd:  No distention.  Soft with pronounced right lower quadrant and suprapubic tenderness Other:  No edema.  No rash.  Moist mucosa   ED Results / Procedures / Treatments   Labs (all labs ordered are listed, but only abnormal results are displayed) Labs Reviewed  COMPREHENSIVE METABOLIC PANEL - Abnormal; Notable for the following components:      Result Value   BUN 28 (*)    Total Protein 8.7 (*)    All other components within  normal limits  URINALYSIS, ROUTINE W REFLEX MICROSCOPIC - Abnormal; Notable for the following components:   Color, Urine YELLOW (*)    APPearance CLEAR (*)    Hgb urine dipstick SMALL (*)    Protein, ur 30 (*)    Leukocytes,Ua SMALL (*)    Bacteria, UA RARE (*)    All other components within normal limits  LIPASE, BLOOD  CBC WITH DIFFERENTIAL/PLATELET     EKG    RADIOLOGY CT abdomen pelvis interpreted by me, negative for ureterolithiasis.  Radiology report reviewed   PROCEDURES:  Procedures   MEDICATIONS ORDERED IN ED: Medications  sodium chloride 0.9 % bolus 1,000 mL (1,000 mLs Intravenous New Bag/Given 08/23/22 1553)  ondansetron (ZOFRAN) injection 4 mg (4 mg Intravenous Given 08/23/22 1553)  ketorolac (TORADOL) 15 MG/ML injection 15 mg (15 mg Intravenous Given 08/23/22 1552)     IMPRESSION / MDM / ASSESSMENT AND PLAN / ED COURSE  I reviewed the triage vital signs and the nursing notes.  DDx: UTI, pyelonephritis, ureterolithiasis, appendicitis, DKA, dehydration, electrolyte abnormality, AKI  Patient's presentation is most consistent with  acute presentation with potential threat to life or bodily function.  Patient presents with right flank pain and lower abdominal pain with urinary symptoms.  She is diabetic.  Differential is broad.  Will check labs including urinalysis, CT abdomen pelvis.  Will give IV fluids for hydration and Zofran and Toradol IV for symptom relief.  Vital signs are reassuring, not septic.   ----------------------------------------- 5:33 PM on 08/23/2022 ----------------------------------------- UA consistent with cystitis.  Serum labs are all unremarkable.  CT negative for ureterolithiasis or other complications.  Stable for discharge.  Prescription for Keflex      FINAL CLINICAL IMPRESSION(S) / ED DIAGNOSES   Final diagnoses:  Cystitis     Rx / DC Orders   ED Discharge Orders          Ordered    cephALEXin (KEFLEX) 500 MG capsule  2  times daily        08/23/22 1732             Note:  This document was prepared using Dragon voice recognition software and may include unintentional dictation errors.   Carrie Mew, MD 08/23/22 [phone]

## 2022-09-04 ENCOUNTER — Encounter: Payer: Self-pay | Admitting: Emergency Medicine

## 2022-09-04 ENCOUNTER — Emergency Department
Admission: EM | Admit: 2022-09-04 | Discharge: 2022-09-04 | Disposition: A | Discharge status: Home / Self Care | Payer: Medicaid Other | Attending: Emergency Medicine | Admitting: Emergency Medicine

## 2022-09-04 DIAGNOSIS — R300 Dysuria: Secondary | ICD-10-CM | POA: Diagnosis present

## 2022-09-04 DIAGNOSIS — G8929 Other chronic pain: Secondary | ICD-10-CM | POA: Diagnosis not present

## 2022-09-04 DIAGNOSIS — R1030 Lower abdominal pain, unspecified: Secondary | ICD-10-CM | POA: Diagnosis not present

## 2022-09-04 DIAGNOSIS — E1122 Type 2 diabetes mellitus with diabetic chronic kidney disease: Secondary | ICD-10-CM | POA: Insufficient documentation

## 2022-09-04 DIAGNOSIS — I129 Hypertensive chronic kidney disease with stage 1 through stage 4 chronic kidney disease, or unspecified chronic kidney disease: Secondary | ICD-10-CM | POA: Diagnosis not present

## 2022-09-04 DIAGNOSIS — N189 Chronic kidney disease, unspecified: Secondary | ICD-10-CM | POA: Insufficient documentation

## 2022-09-04 LAB — CBC
HCT: 36.2 % (ref 36.0–46.0)
Hemoglobin: 11.8 g/dL — ABNORMAL LOW (ref 12.0–15.0)
MCH: 29.2 pg (ref 26.0–34.0)
MCHC: 32.6 g/dL (ref 30.0–36.0)
MCV: 89.6 fL (ref 80.0–100.0)
Platelets: 244 10*3/uL (ref 150–400)
RBC: 4.04 MIL/uL (ref 3.87–5.11)
RDW: 13 % (ref 11.5–15.5)
WBC: 5.9 10*3/uL (ref 4.0–10.5)
nRBC: 0 % (ref 0.0–0.2)

## 2022-09-04 LAB — COMPREHENSIVE METABOLIC PANEL
ALT: 25 U/L (ref 0–44)
AST: 31 U/L (ref 15–41)
Albumin: 3.6 g/dL (ref 3.5–5.0)
Alkaline Phosphatase: 85 U/L (ref 38–126)
Anion gap: 10 (ref 5–15)
BUN: 35 mg/dL — ABNORMAL HIGH (ref 6–20)
CO2: 19 mmol/L — ABNORMAL LOW (ref 22–32)
Calcium: 8.9 mg/dL (ref 8.9–10.3)
Chloride: 106 mmol/L (ref 98–111)
Creatinine, Ser: 1.23 mg/dL — ABNORMAL HIGH (ref 0.44–1.00)
GFR, Estimated: 52 mL/min — ABNORMAL LOW (ref >60)
Glucose, Bld: 99 mg/dL (ref 70–99)
Potassium: 3.7 mmol/L (ref 3.5–5.1)
Sodium: 135 mmol/L (ref 135–145)
Total Bilirubin: 0.5 mg/dL (ref 0.3–1.2)
Total Protein: 9 g/dL — ABNORMAL HIGH (ref 6.5–8.1)

## 2022-09-04 LAB — URINALYSIS, ROUTINE W REFLEX MICROSCOPIC
Bilirubin Urine: NEGATIVE
Glucose, UA: NEGATIVE mg/dL
Ketones, ur: NEGATIVE mg/dL
Nitrite: NEGATIVE
Protein, ur: 30 mg/dL — AB
RBC / HPF: >50 RBC/hpf (ref 0–5)
Specific Gravity, Urine: 1.02 (ref 1.005–1.030)
WBC, UA: >50 WBC/hpf (ref 0–5)
pH: 5 (ref 5.0–8.0)

## 2022-09-04 LAB — LIPASE, BLOOD: Lipase: 51 U/L (ref 11–51)

## 2022-09-04 MED ORDER — ONDANSETRON 4 MG PO TBDP: 4.0000 mg | ORAL_TABLET | Freq: Three times a day (TID) | ORAL | 0 refills | Status: AC | PRN

## 2022-09-04 MED ORDER — POLYETHYLENE GLYCOL 3350 17 G PO PACK
17.0000 g | PACK | Freq: Every day | ORAL | Status: DC
  Administered 2022-09-04: 17 g via ORAL
  Filled 2022-09-04: qty 1

## 2022-09-04 MED ORDER — HYDROCODONE-ACETAMINOPHEN 5-325 MG PO TABS: 1.0000 | ORAL_TABLET | Freq: Three times a day (TID) | ORAL | 0 refills | Status: AC | PRN

## 2022-09-04 MED ORDER — ONDANSETRON HCL 4 MG/2ML IJ SOLN
4.0000 mg | Freq: Once | INTRAMUSCULAR | Status: AC
  Administered 2022-09-04: 4 mg via INTRAVENOUS
  Filled 2022-09-04: qty 2

## 2022-09-04 MED ORDER — TAMSULOSIN HCL 0.4 MG PO CAPS: 0.4000 mg | ORAL_CAPSULE | Freq: Every day | ORAL | 0 refills | Status: AC

## 2022-09-04 MED ORDER — SODIUM CHLORIDE 0.9 % IV BOLUS
1000.0000 mL | Freq: Once | INTRAVENOUS | Status: AC
  Administered 2022-09-04: 1000 mL via INTRAVENOUS

## 2022-09-04 MED ORDER — KETOROLAC TROMETHAMINE 30 MG/ML IJ SOLN
30.0000 mg | Freq: Once | INTRAMUSCULAR | Status: AC
  Administered 2022-09-04: 30 mg via INTRAVENOUS
  Filled 2022-09-04: qty 1

## 2022-09-04 NOTE — ED Provider Notes (Signed)
[facility] Emergency Department Provider Note     Event Date/Time   First MD Initiated Contact with Patient 09/04/22 2002     (approximate)   History   Abdominal Pain   HPI  History limited by Spanish language. Tele-interpreter [phone]) used for interview and exam.   Lindsay Dean is a 57 y.o. female with a history of AKI/CKD, HTN, DM, presents to the ED for evaluation of lower abdominal pain. She also notes pelvic pressure,, difficulty passing urine, and difficulty passing stools.  She denies any frank vomiting, fevers, chills, or sweats per patient also denies any hematuria, or urinary retention. She is schedule to see Urology on 3/19.  Physical Exam   Triage Vital Signs: ED Triage Vitals [09/04/22 1952]  Enc Vitals Group     BP (!) 160/84     Pulse Rate 74     Resp 18     Temp 97.8 F (36.6 C)     Temp Source Oral     SpO2 100 %     Weight 150 lb 12.7 oz (68.4 kg)     Height 4\' 9"  (1.448 m)     Head Circumference      Peak Flow      Pain Score 8     Pain Loc      Pain Edu?      Excl. in [place]?     Most recent vital signs: Vitals:   09/04/22 1952 09/04/22 2257  BP: (!) 160/84   Pulse: 74 70  Resp: 18 18  Temp: 97.8 F (36.6 C)   SpO2: 100% 100%    General Awake, no distress. NAD CV:  Good peripheral perfusion.  RESP:  Normal effort.  ABD:  No distention.  Nontender.  Normal active BS x 4.  No significant CVA tenderness noted.   ED Results / Procedures / Treatments   Labs (all labs ordered are listed, but only abnormal results are displayed) Labs Reviewed  COMPREHENSIVE METABOLIC PANEL - Abnormal; Notable for the following components:      Result Value   CO2 19 (*)    BUN 35 (*)    Creatinine, Ser 1.23 (*)    Total Protein 9.0 (*)    GFR, Estimated 52 (*)    All other components within normal limits  CBC - Abnormal; Notable for the following components:   Hemoglobin 11.8 (*)    All other components within  normal limits  URINALYSIS, ROUTINE W REFLEX MICROSCOPIC - Abnormal; Notable for the following components:   Color, Urine YELLOW (*)    APPearance HAZY (*)    Hgb urine dipstick MODERATE (*)    Protein, ur 30 (*)    Leukocytes,Ua LARGE (*)    Bacteria, UA RARE (*)    All other components within normal limits  URINE CULTURE  LIPASE, BLOOD     EKG   RADIOLOGY   No results found.   PROCEDURES:  Critical Care performed: No  Procedures   MEDICATIONS ORDERED IN ED: Medications  polyethylene glycol (MIRALAX / GLYCOLAX) packet 17 g (17 g Oral Given 09/04/22 2120)  sodium chloride 0.9 % bolus 1,000 mL (0 mLs Intravenous Stopped 09/04/22 2220)  ondansetron (ZOFRAN) injection 4 mg (4 mg Intravenous Given 09/04/22 2119)  ketorolac (TORADOL) 30 MG/ML injection 30 mg (30 mg Intravenous Given 09/04/22 2119)     IMPRESSION / MDM / ASSESSMENT AND PLAN / ED COURSE  I reviewed the triage vital signs and  the nursing notes.                              Differential diagnosis includes, but is not limited to, acute appendicitis, diverticulitis, urinary tract infection/pyelonephritis, bowel obstruction, colitis, renal colic, gastroenteritis, hernia, etc.  Patient's presentation is most consistent with acute complicated illness / injury requiring diagnostic workup.  Patient to the ED for evaluation of chronic lower abdominal pain.  Patient presents with symptoms concerning for possible dysuria versus UTI.  Reassuring exam and stable vital signs at this time.  Labs reveal AKI with a history of CKD.  Patient is treated with a fluid bolus in the ED, and urine culture is pending at this time.  UA reveals mild bacteriuria.  Patient's diagnosis is consistent with dysuria. Patient will be discharged home with prescriptions for tamsulosin, Zofran, and Vicodin (#9).  She reports improvement of her pain with him and his interim disposition.  Patient is to follow up with PCP or urology as scheduled as needed  or otherwise directed. Patient is given ED precautions to return to the ED for any worsening or new symptoms.   FINAL CLINICAL IMPRESSION(S) / ED DIAGNOSES   Final diagnoses:  Dysuria     Rx / DC Orders   ED Discharge Orders          Ordered    tamsulosin (FLOMAX) 0.4 MG CAPS capsule  Daily after breakfast        09/04/22 2247    ondansetron (ZOFRAN-ODT) 4 MG disintegrating tablet  Every 8 hours PRN        09/04/22 2247    HYDROcodone-acetaminophen (NORCO) 5-325 MG tablet  3 times daily PRN        09/04/22 2247             Note:  This document was prepared using Dragon voice recognition software and may include unintentional dictation errors.    Melvenia Needles, PA-C 09/04/22 2302    Harvest Dark, MD 09/04/22 2303

## 2022-09-04 NOTE — Discharge Instructions (Signed)
Your exam and lab are overall reassuring.  Your urine is being tested for bacteria.  You will be notified by telephone if you need to take an antibiotic.  Take the pain medicine as needed and the bladder spasm medication as prescribed.  Drink plenty of fluid and empty bladder on schedule.  Consider twice daily MiraLAX to promote stool passage.  Follow-up with urology as scheduled.  Su examen y laboratorio son en general tranquilizadores. Se estn analizando su orina para detectar bacterias. Se le notificar por telfono si necesita tomar un antibitico. Tome el analgsico segn sea necesario y el medicamento para el espasmo de la vejiga segn lo recetado. Beba mucho lquido y vace la vejiga a tiempo. Considere MiraLAX dos veces al da para promover el paso de las heces. Seguimiento con urologa segn lo programado.

## 2022-09-04 NOTE — ED Notes (Signed)
ED Provider at bedside. 

## 2022-09-04 NOTE — ED Triage Notes (Signed)
Pt presents ambulatory to triage via POV with complaints of lower abdominal pain that has been present for several weeks. Pt recently treated for a UTI and completed her antibiotics but the pain is still present. Pt states the pain feels like a sharp cramp. Rates pain 8/10. A&Ox4 at this time. Denies CP or SOB.

## 2022-09-06 LAB — URINE CULTURE: Culture: NO GROWTH

## 2022-09-08 ENCOUNTER — Ambulatory Visit (INDEPENDENT_AMBULATORY_CARE_PROVIDER_SITE_OTHER): Payer: Medicaid Other | Admitting: Physician Assistant

## 2022-09-08 ENCOUNTER — Ambulatory Visit
Admission: RE | Admit: 2022-09-08 | Discharge: 2022-09-08 | Disposition: A | Discharge status: Home / Self Care | Payer: Medicaid Other | Source: Ambulatory Visit | Attending: Physician Assistant | Admitting: Physician Assistant

## 2022-09-08 VITALS — BP 131/85 | HR 64 | Ht <= 58 in | Wt 140.0 lb

## 2022-09-08 DIAGNOSIS — N390 Urinary tract infection, site not specified: Secondary | ICD-10-CM | POA: Diagnosis present

## 2022-09-08 DIAGNOSIS — N200 Calculus of kidney: Secondary | ICD-10-CM | POA: Diagnosis not present

## 2022-09-08 DIAGNOSIS — R319 Hematuria, unspecified: Secondary | ICD-10-CM | POA: Diagnosis present

## 2022-09-08 LAB — URINALYSIS, COMPLETE
Bilirubin, UA: NEGATIVE
Glucose, UA: NEGATIVE
Ketones, UA: NEGATIVE
Nitrite, UA: NEGATIVE
Specific Gravity, UA: 1.015 (ref 1.005–1.030)
Urobilinogen, Ur: 0.2 mg/dL (ref 0.2–1.0)
pH, UA: 5.5 (ref 5.0–7.5)

## 2022-09-08 LAB — MICROSCOPIC EXAMINATION
RBC, Urine: >30 /hpf — AB (ref 0–2)
WBC, UA: >30 /hpf — AB (ref 0–5)

## 2022-09-08 MED ORDER — IOHEXOL 300 MG/ML  SOLN
100.0000 mL | Freq: Once | INTRAMUSCULAR | Status: AC | PRN
  Administered 2022-09-08: 100 mL via INTRAVENOUS

## 2022-09-08 MED ORDER — SULFAMETHOXAZOLE-TRIMETHOPRIM 800-160 MG PO TABS: 1.0000 | ORAL_TABLET | Freq: Two times a day (BID) | ORAL | 0 refills | Status: AC

## 2022-09-08 NOTE — Addendum Note (Signed)
Addended by: Mickle Plumb on: 09/08/2022 04:29 PM   Modules accepted: Orders

## 2022-09-08 NOTE — Progress Notes (Addendum)
09/08/2022 10:43 AM   Lindsay Dean 1966/04/11 [id number]  CC: Chief Complaint  Patient presents with   Follow-up   HPI: Lindsay Dean is a 57 y.o. female with PMH diabetes, recurrent stone disease, and recurrent urosepsis s/p right ureteral reimplant who presents today for ED follow-up. Visit today facilitated by in-person and then video [place] interpreter.  She was seen in the ED on 08/23/2022 with reports of dysuria and right flank pain.  UA was notable for >50 WBCs/hpf, 6-10 RBCs/hpf, and rare bacteria.  She was treated with Keflex 500 mg twice daily x 7 days.  No urine culture was sent.  CT stone study showed stable right hydroureteronephrosis to the level of the UVJ without obstructing stone.  She returned to the emergency department on 09/04/2022 with reports of persistent lower abdominal pain that did not resolve with antibiotics.  UA that day with >50 WBCs/hpf, >50 RBCs/hpf, and rare bacteria.  Culture finalized with no growth.  She has not given any additional antibiotics.  Today she reports persistent, severe bladder pressure and pain that is constant.  She has had persistent right flank pain, however it is slightly improved compared to her first ED visit.  She has had ongoing nausea and chills since she first presented to the ED but denies fever or vomiting.  I sent her from clinic for stat CT AP with and without contrast due to concern for possible stone versus abscess.  CT notable for multifocal urothelial enhancement involving the right renal collecting system, right ureter, and right lateral bladder wall concerning for multifocal urothelial carcinoma.  In-office UA today positive for 3+ blood, trace protein, and 1+ leukocytes; urine microscopy with >30 WBCs/HPF, >30 RBCs/HPF, and moderate bacteria.  PMH: Past Medical History:  Diagnosis Date   Diabetes mellitus without complication ([place])    taken off meds in 2017   Hypertension    Lupus ([place])      Surgical History: Past Surgical History:  Procedure Laterality Date   ABDOMINAL HYSTERECTOMY     CYSTOSCOPY W/ RETROGRADES Left 10/07/2021   Procedure: CYSTOSCOPY WITH RETROGRADE PYELOGRAM;  Surgeon: Abbie Sons, MD;  Location: [facility] ORS;  Service: Urology;  Laterality: Left;   CYSTOSCOPY WITH STENT PLACEMENT Right 05/04/2019   Procedure: CYSTOSCOPY with attempt of stent placement;  Surgeon: Billey Co, MD;  Location: [facility] ORS;  Service: Urology;  Laterality: Right;   CYSTOSCOPY/URETEROSCOPY/HOLMIUM LASER/STENT PLACEMENT Left 10/07/2021   Procedure: CYSTOSCOPY/URETEROSCOPY/HOLMIUM LASER/STENT PLACEMENT;  Surgeon: Abbie Sons, MD;  Location: [facility] ORS;  Service: Urology;  Laterality: Left;   IR NEPHRO TUBE REMOV/FL  12/29/2021   IR NEPHROSTOMY PLACEMENT RIGHT  12/18/2021    Home Medications:  Allergies as of 09/08/2022       Reactions   Tramadol Nausea Only   Other reaction(s): Dizziness        Medication List        Accurate as of September 08, 2022 10:43 AM. If you have any questions, ask your nurse or doctor.          atenolol 50 MG tablet Commonly known as: TENORMIN Take 50 mg by mouth daily.   atorvastatin 20 MG tablet Commonly known as: LIPITOR Take 20 mg by mouth daily.   cephALEXin 500 MG capsule Commonly known as: KEFLEX Take 1 capsule (500 mg total) by mouth 2 (two) times daily.   docusate sodium 100 MG capsule Commonly known as: COLACE Take 1 capsule (100 mg total) by mouth 2 (two) times  daily.   hydrochlorothiazide 25 MG tablet Commonly known as: HYDRODIURIL Take 25 mg by mouth daily.   hydroxychloroquine 200 MG tablet Commonly known as: PLAQUENIL Take 1 tablet by mouth daily.   losartan 100 MG tablet Commonly known as: COZAAR Take 100 mg by mouth daily.   omeprazole 20 MG capsule Commonly known as: PRILOSEC Take 20 mg by mouth daily.   ondansetron 4 MG disintegrating tablet Commonly known as: ZOFRAN-ODT Take 1 tablet (4 mg  total) by mouth every 8 (eight) hours as needed for nausea or vomiting.   tamsulosin 0.4 MG Caps capsule Commonly known as: FLOMAX Take 1 capsule (0.4 mg total) by mouth daily after breakfast for 5 days.        Allergies:  Allergies  Allergen Reactions   Tramadol Nausea Only    Other reaction(s): Dizziness    Family History: Family History  Problem Relation Age of Onset   Diabetes Mother    Heart disease Mother    Hypertension Mother    Arthritis Mother    Hypertension Father    Lung disease Father     Social History:   reports that she has never smoked. She has never used smokeless tobacco. She reports that she does not drink alcohol and does not use drugs.  Physical Exam: BP 131/85   Pulse 64   Ht 4\' 9"  (1.448 m)   Wt 140 lb (63.5 kg)   BMI 30.30 kg/m   Constitutional:  Alert and oriented, no acute distress, nontoxic appearing HEENT: [employer], AT Cardiovascular: No clubbing, cyanosis, or edema Respiratory: Normal respiratory effort, no increased work of breathing Skin: No rashes, bruises or suspicious lesions Neurologic: Grossly intact, no focal deficits, moving all 4 extremities Psychiatric: Normal mood and affect  Laboratory Data: Results for orders placed or performed in visit on 09/08/22  Microscopic Examination   Urine  Result Value Ref Range   WBC, UA >30 (A) 0 - 5 /hpf   RBC, Urine >30 (A) 0 - 2 /hpf   Epithelial Cells (non renal) 0-10 0 - 10 /hpf   Bacteria, UA Moderate (A) None seen/Few  Urinalysis, Complete  Result Value Ref Range   Specific Gravity, UA 1.015 1.005 - 1.030   pH, UA 5.5 5.0 - 7.5   Color, UA Yellow Yellow   Appearance Ur Hazy (A) Clear   Leukocytes,UA 1+ (A) Negative   Protein,UA Trace (A) Negative/Trace   Glucose, UA Negative Negative   Ketones, UA Negative Negative   RBC, UA 3+ (A) Negative   Bilirubin, UA Negative Negative   Urobilinogen, Ur 0.2 0.2 - 1.0 mg/dL   Nitrite, UA Negative Negative   Microscopic Examination See  below:    Pertinent Imaging: STAT CT AP with and without contrast, 09/08/2022: CLINICAL DATA:  Recurrent UTI, right flank pain, difficulty voiding. Microscopic hematuria.   EXAM: CT ABDOMEN AND PELVIS WITHOUT AND WITH CONTRAST   TECHNIQUE: Multidetector CT imaging of the abdomen and pelvis was performed following the standard protocol before and following the bolus administration of intravenous contrast.   RADIATION DOSE REDUCTION: This exam was performed according to the departmental dose-optimization program which includes automated exposure control, adjustment of the mA and/or kV according to patient size and/or use of iterative reconstruction technique.   CONTRAST:  171mL OMNIPAQUE IOHEXOL 300 MG/ML  SOLN   COMPARISON:  08/23/2022.   FINDINGS: Lower chest: No acute findings. Heart is enlarged. No pericardial or pleural effusion. Distal esophagus is grossly unremarkable.   Hepatobiliary: Liver  is unremarkable. Cholecystectomy. No biliary ductal dilatation.   Pancreas: Negative.   Spleen: Negative.   Adrenals/Urinary Tract: Adrenal glands are unremarkable. Scarring in the kidneys bilaterally. Punctate renal stones bilaterally. Subcentimeter low-attenuation lesions in the kidneys are too small to characterize. No specific follow-up necessary. Persistent mild prominence of the right ureter. Question slight enhancement and wall thickening involving the renal pelvis and proximal right ureter. Proximal right ureter is tortuous. There is an abrupt transition to normal caliber ureter at the right ureteral pelvic junction, with some suspected enhancement (9/60 5-66). No additional filling defects in the intrarenal collecting systems or ureters. On portal venous phase imaging, there is segmental thickening and mural enhancement along the inferior right bladder wall (9/66 and 12/74-81).   Stomach/Bowel: Stomach, small bowel, appendix and colon are unremarkable.    Vascular/Lymphatic: Vascular structures are unremarkable. No pathologically enlarged lymph nodes.   Reproductive: Hysterectomy.  No adnexal mass.   Other: No free fluid.  Mesenteries and peritoneum are unremarkable.   Musculoskeletal: Degenerative changes in the spine. Old left rib fractures. Grade 2 anterolisthesis of L4 on L5 without pars defects. No worrisome lytic or sclerotic lesions.   IMPRESSION: 1. Persistent mild prominence of the right ureter with a suspected stenosis in enhancement at the right ureteral pelvic junction. Possible wall thickening and enhancement involving the right renal pelvis and proximal right ureter. Segmental wall thickening and mural enhancement along the right lateral wall of the bladder. Findings are worrisome for multifocal urothelial carcinoma. 2. Punctate bilateral renal stones. 3. Grade 2 anterolisthesis of L4 on L5 without pars defects.     Electronically Signed   By: Lorin Picket M.D.   On: 09/08/2022 13:16  I personally reviewed the images referenced above and note .  Assessment & Plan:   1. Complicated UTI (urinary tract infection) Ongoing flank and bladder pain with grossly infected appearing urine despite negative culture earlier this month. Will start empiric Bactrim and send for standard and atypical cultures to rule out infection.  Stat CT today significant for right-sided urothelial enhancement concerning for urothelial carcinoma.  Notably, her right UO s/p right ureteral reimplant has been difficult to identify intraoperatively in the past.  Will plan for an office cystoscopy with Dr. Bernardo Heater for further evaluation; upper tract biopsy will pose a challenge. - Urinalysis, Complete - CULTURE, URINE COMPREHENSIVE - Mycoplasma / ureaplasma culture - CT Abdomen Pelvis W Wo Contrast; Future  2. Recurrent nephrolithiasis Punctate bilateral nonobstructing renal stones, noncontributory to current symptoms.  No evidence of obstructive  stone. - CT Abdomen Pelvis W Wo Contrast; Future   Return in about 2 weeks (around 09/22/2022) for Cysto with Dr. Bernardo Heater.  Debroah Loop, PA-C  [facility] [address] [place], [place] [address] [phone]

## 2022-09-12 LAB — CULTURE, URINE COMPREHENSIVE

## 2022-09-14 ENCOUNTER — Telehealth: Payer: Self-pay | Admitting: *Deleted

## 2022-09-14 ENCOUNTER — Other Ambulatory Visit: Payer: Self-pay

## 2022-09-14 DIAGNOSIS — N390 Urinary tract infection, site not specified: Secondary | ICD-10-CM

## 2022-09-14 MED ORDER — CIPROFLOXACIN HCL 250 MG PO TABS: 250.0000 mg | ORAL_TABLET | Freq: Two times a day (BID) | ORAL | 0 refills | Status: AC

## 2022-09-14 NOTE — Telephone Encounter (Signed)
-----   Message from Debroah Loop, [place] sent at 09/14/2022  4:29 PM EDT ----- Need to switch abx based on culture results. Please stop Bactrim and start Cipro 250mg  BID x7 days. She should keep plans for cystoscopy with Dr. Bernardo Heater.

## 2022-09-15 LAB — MYCOPLASMA / UREAPLASMA CULTURE
Mycoplasma hominis Culture: NEGATIVE
Ureaplasma urealyticum: NEGATIVE

## 2022-09-16 ENCOUNTER — Ambulatory Visit (INDEPENDENT_AMBULATORY_CARE_PROVIDER_SITE_OTHER): Payer: Medicaid Other | Admitting: Urology

## 2022-09-16 ENCOUNTER — Encounter: Payer: Self-pay | Admitting: Urology

## 2022-09-16 VITALS — BP 149/84 | HR 78 | Ht <= 58 in | Wt 146.0 lb

## 2022-09-16 DIAGNOSIS — N390 Urinary tract infection, site not specified: Secondary | ICD-10-CM | POA: Diagnosis not present

## 2022-09-16 DIAGNOSIS — R9341 Abnormal radiologic findings on diagnostic imaging of renal pelvis, ureter, or bladder: Secondary | ICD-10-CM

## 2022-09-16 LAB — MICROSCOPIC EXAMINATION: Epithelial Cells (non renal): >10 /hpf — AB (ref 0–10)

## 2022-09-16 LAB — URINALYSIS, COMPLETE
Bilirubin, UA: NEGATIVE
Glucose, UA: NEGATIVE
Ketones, UA: NEGATIVE
Nitrite, UA: NEGATIVE
Protein,UA: NEGATIVE
Specific Gravity, UA: 1.015 (ref 1.005–1.030)
Urobilinogen, Ur: 0.2 mg/dL (ref 0.2–1.0)
pH, UA: 5.5 (ref 5.0–7.5)

## 2022-09-16 NOTE — Progress Notes (Signed)
   09/16/22  CC:  Chief Complaint  Patient presents with   Nephrolithiasis    HPI: Refer to Sam Vaillancourt's prior note 09/08/2022.  Urine culture positive for Enterococcus and Proteus and antibiotics were switched to Cipro.  She has noted resolution of her symptoms and is currently doing well.  UA today with mild pyuria and minimal microhematuria with >10 epis  Blood pressure (!) 149/84, pulse 78, height 4\' 9"  (1.448 m), weight 146 lb (66.2 kg). NED. A&Ox3.   No respiratory distress   Abd soft, NT, ND Normal external genitalia with patent urethral meatus  Cystoscopy Procedure Note  Patient identification was confirmed, informed consent was obtained, and patient was prepped using Betadine solution.  Lidocaine jelly was administered per urethral meatus.    Procedure: - Flexible cystoscope introduced, without any difficulty.   - Thorough search of the bladder revealed:    normal urethral meatus    normal urothelium    no stones    no ulcers     no tumors    no urethral polyps    no trabeculation  -Reimplanted right UO not identified  Post-Procedure: - Patient tolerated the procedure well  Assessment/ Plan: Normal cystoscopy today and CT findings indicative of infection/inflammation changes PA follow-up 1 month with repeat UA and symptom check    Abbie Sons, MD

## 2022-09-29 ENCOUNTER — Ambulatory Visit
Admission: RE | Admit: 2022-09-29 | Discharge: 2022-09-29 | Disposition: A | Discharge status: Home / Self Care | Payer: Medicaid Other | Source: Ambulatory Visit | Attending: Nurse Practitioner | Admitting: Nurse Practitioner

## 2022-09-29 DIAGNOSIS — Z1231 Encounter for screening mammogram for malignant neoplasm of breast: Secondary | ICD-10-CM

## 2022-10-14 ENCOUNTER — Ambulatory Visit
Admission: EM | Admit: 2022-10-14 | Discharge: 2022-10-14 | Disposition: A | Discharge status: Home / Self Care | Payer: Medicaid Other | Attending: Physician Assistant | Admitting: Physician Assistant

## 2022-10-14 ENCOUNTER — Ambulatory Visit (INDEPENDENT_AMBULATORY_CARE_PROVIDER_SITE_OTHER): Payer: Medicaid Other

## 2022-10-14 DIAGNOSIS — J302 Other seasonal allergic rhinitis: Secondary | ICD-10-CM

## 2022-10-14 DIAGNOSIS — Z885 Allergy status to narcotic agent status: Secondary | ICD-10-CM | POA: Insufficient documentation

## 2022-10-14 DIAGNOSIS — R439 Unspecified disturbances of smell and taste: Secondary | ICD-10-CM | POA: Diagnosis not present

## 2022-10-14 DIAGNOSIS — R0981 Nasal congestion: Secondary | ICD-10-CM | POA: Diagnosis not present

## 2022-10-14 DIAGNOSIS — H9319 Tinnitus, unspecified ear: Secondary | ICD-10-CM | POA: Insufficient documentation

## 2022-10-14 DIAGNOSIS — R059 Cough, unspecified: Secondary | ICD-10-CM

## 2022-10-14 DIAGNOSIS — Z1152 Encounter for screening for COVID-19: Secondary | ICD-10-CM | POA: Diagnosis not present

## 2022-10-14 DIAGNOSIS — R0602 Shortness of breath: Secondary | ICD-10-CM | POA: Diagnosis not present

## 2022-10-14 DIAGNOSIS — I517 Cardiomegaly: Secondary | ICD-10-CM | POA: Insufficient documentation

## 2022-10-14 LAB — RESP PANEL BY RT-PCR (RSV, FLU A&B, COVID)  RVPGX2
Influenza A by PCR: NEGATIVE
Influenza B by PCR: NEGATIVE
Resp Syncytial Virus by PCR: NEGATIVE
SARS Coronavirus 2 by RT PCR: NEGATIVE

## 2022-10-14 MED ORDER — CETIRIZINE HCL 10 MG PO TABS: 10.0000 mg | ORAL_TABLET | Freq: Every day | ORAL | 0 refills | Status: DC

## 2022-10-14 MED ORDER — FLUTICASONE PROPIONATE 50 MCG/ACT NA SUSP: 1.0000 | Freq: Every day | NASAL | 2 refills | Status: DC

## 2022-10-14 MED ORDER — PROMETHAZINE-DM 6.25-15 MG/5ML PO SYRP: 5.0000 mL | ORAL_SOLUTION | Freq: Four times a day (QID) | ORAL | 0 refills | Status: DC | PRN

## 2022-10-14 NOTE — ED Triage Notes (Signed)
Pt c/o cough,congestion,& sob x3 days. Denies any fevers, wheezing or hx of asthma.

## 2022-10-14 NOTE — ED Provider Notes (Signed)
[id number]    CSN: [id number] Arrival date & time: 10/14/22  8295      History   Chief Complaint Chief Complaint  Patient presents with   Cough   Congestion    HPI Lindsay Dean is a 57 y.o. female.   Spanish interpreter Davonna Belling [phone]  Patient is a 57 year old female who presents with chief complaint of cough, body aches, headache, runny nose, congestion for about 1 week.  Patient also reports loss of taste and smell.  Patient denies any chest pain but does report some shortness of breath.  Patient also reports some ringing in her ears but states that this is not anything new for her.  Patient has taken some Tylenol at home.  She reports allergies to tramadol.    Past Medical History:  Diagnosis Date   Diabetes mellitus without complication    taken off meds in 2017   Hypertension    Lupus     Patient Active Problem List   Diagnosis Date Noted   Acute unilateral obstructive uropathy 12/18/2021   Sepsis due to gram-negative UTI 12/18/2021   Acute kidney injury superimposed on chronic kidney disease 12/18/2021   Essential hypertension 12/18/2021   Dyslipidemia 12/18/2021   GERD without esophagitis 12/18/2021   Type 2 diabetes mellitus without complications 12/18/2021   Pyelonephritis 11/05/2021   Sepsis secondary to UTI 11/04/2021   UTI (urinary tract infection) 11/04/2021   Hyponatremia 11/04/2021   AKI (acute kidney injury) 11/04/2021   Ureterolithiasis 10/06/2021   Diabetes mellitus without complication    Hypertension 10/28/2020   Obesity 10/28/2020   PAD (peripheral artery disease) 10/28/2020   Sepsis 05/04/2019   Cystocele with rectocele 05/27/2018   Prolapsed, uterovaginal, incomplete 05/27/2018   Sleep apnea 05/27/2018   Spondylolisthesis 05/27/2018   SUI (stress urinary incontinence, female) 05/27/2018   Back pain 03/05/2014   Lupus 03/05/2014   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    Past Surgical History:  Procedure  Laterality Date   ABDOMINAL HYSTERECTOMY     CYSTOSCOPY W/ RETROGRADES Left 10/07/2021   Procedure: CYSTOSCOPY WITH RETROGRADE PYELOGRAM;  Surgeon: Riki Altes, MD;  Location: [facility];  Service: Urology;  Laterality: Left;   CYSTOSCOPY WITH STENT PLACEMENT Right 05/04/2019   Procedure: CYSTOSCOPY with attempt of stent placement;  Surgeon: Sondra Come, MD;  Location: [facility];  Service: Urology;  Laterality: Right;   CYSTOSCOPY/URETEROSCOPY/HOLMIUM LASER/STENT PLACEMENT Left 10/07/2021   Procedure: CYSTOSCOPY/URETEROSCOPY/HOLMIUM LASER/STENT PLACEMENT;  Surgeon: Riki Altes, MD;  Location: [facility];  Service: Urology;  Laterality: Left;   IR NEPHRO TUBE REMOV/FL  12/29/2021   IR NEPHROSTOMY PLACEMENT RIGHT  12/18/2021    OB History   No obstetric history on file.      Home Medications    Prior to Admission medications   Medication Sig Start Date End Date Taking? Authorizing Provider  atenolol (TENORMIN) 50 MG tablet Take 50 mg by mouth daily.   Yes [provider]  atorvastatin (LIPITOR) 20 MG tablet Take 20 mg by mouth daily.   Yes [provider]  cephALEXin (KEFLEX) 500 MG capsule Take 1 capsule (500 mg total) by mouth 2 (two) times daily. 08/23/22  Yes Sharman Cheek, MD  cetirizine (ZYRTEC) 10 MG tablet Take 1 tablet (10 mg total) by mouth daily. 10/14/22  Yes Candis Schatz, PA-C  fluticasone (FLONASE) 50 MCG/ACT nasal spray Place 1 spray into both nostrils daily. 10/14/22  Yes Candis Schatz, PA-C  hydrochlorothiazide (HYDRODIURIL) 25 MG tablet  Take 25 mg by mouth daily. 08/27/21  Yes [provider]  hydroxychloroquine (PLAQUENIL) 200 MG tablet Take 1 tablet by mouth daily. 01/29/22 01/29/23 Yes [provider]  losartan (COZAAR) 100 MG tablet Take 100 mg by mouth daily. 07/25/21  Yes [provider]  omeprazole (PRILOSEC) 20 MG capsule Take 20 mg by mouth daily. 07/25/21  Yes [provider]  ondansetron (ZOFRAN) 4  MG tablet Take 4 mg by mouth every 8 (eight) hours as needed. 07/21/22  Yes [provider]  ondansetron (ZOFRAN-ODT) 4 MG disintegrating tablet Take 1 tablet (4 mg total) by mouth every 8 (eight) hours as needed for nausea or vomiting. 09/04/22  Yes Menshew, Charlesetta Ivory, PA-C  promethazine-dextromethorphan (PROMETHAZINE-DM) 6.25-15 MG/5ML syrup Take 5 mLs by mouth 4 (four) times daily as needed for cough. 10/14/22  Yes Candis Schatz, PA-C    Family History Family History  Problem Relation Age of Onset   Diabetes Mother    Heart disease Mother    Hypertension Mother    Arthritis Mother    Hypertension Father    Lung disease Father    Breast cancer Maternal Uncle     Social History Social History   Tobacco Use   Smoking status: Never   Smokeless tobacco: Never  Vaping Use   Vaping Use: Never used  Substance Use Topics   Alcohol use: No   Drug use: No     Allergies   Tramadol   Review of Systems Review of Systems as noted above in HPI.  Other systems reviewed found to be negative    Physical Exam Triage Vital Signs ED Triage Vitals  Enc Vitals Group     BP 10/14/22 0833 (!) 157/99     Pulse Rate 10/14/22 0833 74     Resp 10/14/22 0833 16     Temp 10/14/22 0833 98.4 F (36.9 C)     Temp Source 10/14/22 0833 Oral     SpO2 10/14/22 0833 97 %     Weight 10/14/22 0832 150 lb (68 kg)     Height 10/14/22 0832 4\' 9"  (1.448 m)     Head Circumference --      Peak Flow --      Pain Score 10/14/22 0840 0     Pain Loc --      Pain Edu? --      Excl. in GC? --    No data found.  Updated Vital Signs BP (!) 157/99 (BP Location: Left Arm)   Pulse 74   Temp 98.4 F (36.9 C) (Oral)   Resp 16   Ht 4\' 9"  (1.448 m)   Wt 150 lb (68 kg)   SpO2 97%   BMI 32.46 kg/m   Visual Acuity Right Eye Distance:   Left Eye Distance:   Bilateral Distance:    Right Eye Near:   Left Eye Near:    Bilateral Near:     Physical Exam Constitutional:      Appearance:  Normal appearance.  HENT:     Head: Normocephalic and atraumatic.     Right Ear: A middle ear effusion is present. Tympanic membrane is not erythematous.     Left Ear: A middle ear effusion is present. Tympanic membrane is not erythematous.     Nose: Congestion and rhinorrhea present.     Mouth/Throat:     Mouth: Mucous membranes are moist.     Pharynx: Uvula midline. No oropharyngeal exudate or posterior oropharyngeal erythema.  Cardiovascular:     Rate and Rhythm: Normal rate and regular rhythm.  Pulmonary:     Effort: Pulmonary effort is normal.     Breath sounds: Wheezing present. No rhonchi.     Comments: Cough with forced expiration and deep breaths Abdominal:     General: Abdomen is flat.     Palpations: Abdomen is soft.  Musculoskeletal:     Cervical back: Normal range of motion.  Lymphadenopathy:     Cervical: Cervical adenopathy present.  Neurological:     General: No focal deficit present.     Mental Status: She is alert and oriented to person, place, and time.      UC Treatments / Results  Labs (all labs ordered are listed, but only abnormal results are displayed) Labs Reviewed  RESP PANEL BY RT-PCR (RSV, FLU A&B, COVID)  [id number]    EKG   Radiology DG Chest 2 View  Result Date: 10/14/2022 CLINICAL DATA:  Cough, congestion shortness of breath 3 days. EXAM: CHEST - 2 VIEW COMPARISON:  PA and lateral chest 05/04/2019. FINDINGS: The lungs are clear. There is mild cardiomegaly. No pneumothorax or pleural effusion. No acute or focal bony abnormality. IMPRESSION: No acute disease. Mild cardiomegaly. Electronically Signed   By: Drusilla Kanner M.D.   On: 10/14/2022 09:17    Procedures Procedures (including critical care time)  Medications Ordered in UC Medications - No data to display  Initial Impression / Assessment and Plan / UC Course  I have reviewed the triage vital signs and the nursing notes.  Pertinent labs & imaging results that were available during  my care of the patient were reviewed by me and considered in my medical decision making (see chart for details).     Patient presents with about 1 week of cough, body aches, headache, runny nose, congestion, loss of taste and smell and some chest pain.  Patient also has some shortness of breath as well as a cough with deep inspiration.  Some bilateral ear effusion but no erythema concerning for ear infection.  Heart sounds within normal limits.  Will check a chest x-ray with her cough with deep inspiration as well as forceful expiration and will also check a COVID and flu swab..... Strep was negative.  Symptoms likely seasonal allergy related.  Prescription for Flonase and Zyrtec as well as a promethazine cough syrup.  -Your chest xray did not show any signs of pneumonia. -Your symptoms are likely related to seasonal allergies.  -Take Flonase with one spray to each nostril in the morning and the Zyrtech at night. -Promethazine-DM cough syrup every four hours as needed for cough. This medication may make you sleepy --You can use over the counter medications for symptom management --Ibuprophen or Tylenol for pain (Above translated for AVS using Google Translate)   Final Clinical Impressions(s) / UC Diagnoses   Final diagnoses:  None     Discharge Instructions      -Su radiografa de trax no mostr ningn signo de neumona. -Es probable que sus sntomas estn relacionados con Theatre manager. -Tome Flonase con una pulverizacin en cada fosa nasal por la maana y Zyrtech por la noche. -Jarabe para la tos con prometazina DM cada cuatro horas segn sea necesario para la tos. Este medicamento puede causarle sueo. -Puede utilizar medicamentos de venta libre para controlar los sntomas. -Ibuprofeno o Tylenol para Chief Technology Officer.     ED Prescriptions     Medication Sig Dispense Auth. Provider   fluticasone (FLONASE) 50 MCG/ACT  nasal spray Place 1 spray into both nostrils daily. 16 g  Candis Schatz, PA-C   cetirizine (ZYRTEC) 10 MG tablet Take 1 tablet (10 mg total) by mouth daily. 30 tablet Candis Schatz, PA-C   promethazine-dextromethorphan (PROMETHAZINE-DM) 6.25-15 MG/5ML syrup Take 5 mLs by mouth 4 (four) times daily as needed for cough. 118 mL Candis Schatz, PA-C      PDMP not reviewed this encounter.   Candis Schatz, PA-C 10/14/22 [phone]

## 2022-10-14 NOTE — Discharge Instructions (Signed)
-  Su radiografa de trax no mostr ningn signo de neumona. -Es probable que sus sntomas estn relacionados con Theatre manager. -Tome Flonase con una pulverizacin en cada fosa nasal por la maana y Zyrtech por la noche. -Jarabe para la tos con prometazina DM cada cuatro horas segn sea necesario para la tos. Este medicamento puede causarle sueo. -Puede utilizar medicamentos de venta libre para controlar los sntomas. -Ibuprofeno o Tylenol para Chief Technology Officer.

## 2022-10-16 ENCOUNTER — Ambulatory Visit: Payer: Medicaid Other | Admitting: Physician Assistant

## 2022-10-21 ENCOUNTER — Other Ambulatory Visit (INDEPENDENT_AMBULATORY_CARE_PROVIDER_SITE_OTHER): Payer: Self-pay | Admitting: Vascular Surgery

## 2022-10-21 DIAGNOSIS — I739 Peripheral vascular disease, unspecified: Secondary | ICD-10-CM

## 2022-10-25 DIAGNOSIS — I70219 Atherosclerosis of native arteries of extremities with intermittent claudication, unspecified extremity: Secondary | ICD-10-CM | POA: Insufficient documentation

## 2022-10-25 NOTE — Progress Notes (Deleted)
MRN : [id number]  Lindsay Dean is a 57 y.o. (June 22, 1966) female who presents with chief complaint of check circulation.  History of Present Illness:   The patient is seen for evaluation of painful lower extremities. Patient notes the pain is variable and not always associated with activity.  The pain is somewhat consistent day to day occurring on most days. The patient notes the pain also occurs with standing and routinely seems worse as the day wears on. The pain has been progressive over the past several years. The patient states these symptoms are causing  a profound negative impact on quality of life and daily activities.   The patient denies rest pain or dangling of an extremity off the side of the bed during the night for relief. No open wounds or sores at this time. No history of DVT or phlebitis. No prior interventions or surgeries.   There is a  history of back problems and DJD of the lumbar and sacral spine.    ABI's Rt=1.20 and Lt=[place] (triphasic signals bilaterally) (Previous ABI Rt=1.07 and Lt=1.13)  No outpatient medications have been marked as taking for the 10/26/22 encounter (Appointment) with Gilda Crease, Latina Craver, MD.    Past Medical History:  Diagnosis Date   Diabetes mellitus without complication (HCC)    taken off meds in 2017   Hypertension    Lupus [facility])     Past Surgical History:  Procedure Laterality Date   ABDOMINAL HYSTERECTOMY     CYSTOSCOPY W/ RETROGRADES Left 10/07/2021   Procedure: CYSTOSCOPY WITH RETROGRADE PYELOGRAM;  Surgeon: Riki Altes, MD;  Location: [facility];  Service: Urology;  Laterality: Left;   CYSTOSCOPY WITH STENT PLACEMENT Right 05/04/2019   Procedure: CYSTOSCOPY with attempt of stent placement;  Surgeon: Sondra Come, MD;  Location: [facility];  Service: Urology;  Laterality: Right;   CYSTOSCOPY/URETEROSCOPY/HOLMIUM LASER/STENT PLACEMENT Left 10/07/2021   Procedure: CYSTOSCOPY/URETEROSCOPY/HOLMIUM  LASER/STENT PLACEMENT;  Surgeon: Riki Altes, MD;  Location: [facility];  Service: Urology;  Laterality: Left;   IR NEPHRO TUBE REMOV/FL  12/29/2021   IR NEPHROSTOMY PLACEMENT RIGHT  12/18/2021    Social History Social History   Tobacco Use   Smoking status: Never   Smokeless tobacco: Never  Vaping Use   Vaping Use: Never used  Substance Use Topics   Alcohol use: No   Drug use: No    Family History Family History  Problem Relation Age of Onset   Diabetes Mother    Heart disease Mother    Hypertension Mother    Arthritis Mother    Hypertension Father    Lung disease Father    Breast cancer Maternal Uncle     Allergies  Allergen Reactions   Tramadol Nausea Only    Other reaction(s): Dizziness     REVIEW OF SYSTEMS (Negative unless checked)  Constitutional: [] Weight loss  [] Fever  [] Chills Cardiac: [] Chest pain   [] Chest pressure   [] Palpitations   [] Shortness of breath when laying flat   [] Shortness of breath with exertion. Vascular:  [x] Pain in legs with walking   [] Pain in legs at rest  [] History of DVT   [] Phlebitis   [] Swelling in legs   [] Varicose veins   [] Non-healing ulcers Pulmonary:   [] Uses home oxygen   [] Productive cough   [] Hemoptysis   [] Wheeze  [] COPD   [] Asthma Neurologic:  [] Dizziness   [] Seizures   [] History of stroke   []   History of TIA  [] Aphasia   [] Vissual changes   [] Weakness or numbness in arm   [x] Weakness or numbness in leg Musculoskeletal:   [] Joint swelling   [] Joint pain   [x] Low back pain Hematologic:  [] Easy bruising  [] Easy bleeding   [] Hypercoagulable state   [] Anemic Gastrointestinal:  [] Diarrhea   [] Vomiting  [x] Gastroesophageal reflux/heartburn   [] Difficulty swallowing. Genitourinary:  [] Chronic kidney disease   [] Difficult urination  [] Frequent urination   [] Blood in urine Skin:  [] Rashes   [] Ulcers  Psychological:  [] History of anxiety   []  History of major depression.  Physical Examination  There were no vitals filed for this  visit. There is no height or weight on file to calculate BMI. Gen: WD/WN, NAD Head: [place]/AT, No temporalis wasting.  Ear/Nose/Throat: Hearing grossly intact, nares w/o erythema or drainage Eyes: PER, EOMI, sclera nonicteric.  Neck: Supple, no masses.  No bruit or JVD.  Pulmonary:  Good air movement, no audible wheezing, no use of accessory muscles.  Cardiac: RRR, normal S1, S2, no Murmurs. Vascular:  mild trophic changes, no open wounds Vessel Right Left  Radial Palpable Palpable  PT Not Palpable Not Palpable  DP Not Palpable Not Palpable  Gastrointestinal: soft, non-distended. No guarding/no peritoneal signs.  Musculoskeletal: M/S 5/5 throughout.  No visible deformity.  Neurologic: CN 2-12 intact. Pain and light touch intact in extremities.  Symmetrical.  Speech is fluent. Motor exam as listed above. Psychiatric: Judgment intact, Mood & affect appropriate for pt's clinical situation. Dermatologic: No rashes or ulcers noted.  No changes consistent with cellulitis.   CBC Lab Results  Component Value Date   WBC 5.9 09/04/2022   HGB 11.8 (L) 09/04/2022   HCT 36.2 09/04/2022   MCV 89.6 09/04/2022   PLT 244 09/04/2022    BMET    Component Value Date/Time   NA 135 09/04/2022 1956   K 3.7 09/04/2022 1956   CL 106 09/04/2022 1956   CO2 19 (L) 09/04/2022 1956   GLUCOSE 99 09/04/2022 1956   BUN 35 (H) 09/04/2022 1956   CREATININE 1.23 (H) 09/04/2022 1956   CALCIUM 8.9 09/04/2022 1956   GFRNONAA 52 (L) 09/04/2022 1956   GFRAA >60 07/04/2019 0953   CrCl cannot be calculated (Patient's most recent lab result is older than the maximum 21 days allowed.).  COAG Lab Results  Component Value Date   INR 1.1 05/04/2019    Radiology DG Chest 2 View  Result Date: 10/14/2022 CLINICAL DATA:  Cough, congestion shortness of breath 3 days. EXAM: CHEST - 2 VIEW COMPARISON:  PA and lateral chest 05/04/2019. FINDINGS: The lungs are clear. There is mild cardiomegaly. No pneumothorax or pleural  effusion. No acute or focal bony abnormality. IMPRESSION: No acute disease. Mild cardiomegaly. Electronically Signed   By: Drusilla Kanner M.D.   On: 10/14/2022 09:17   MM 3D SCREEN BREAST BILATERAL  Result Date: 09/30/2022 CLINICAL DATA:  Screening. EXAM: DIGITAL SCREENING BILATERAL MAMMOGRAM WITH TOMOSYNTHESIS AND CAD TECHNIQUE: Bilateral screening digital craniocaudal and mediolateral oblique mammograms were obtained. Bilateral screening digital breast tomosynthesis was performed. The images were evaluated with computer-aided detection. COMPARISON:  Previous exam(s). ACR Breast Density Category b: There are scattered areas of fibroglandular density. FINDINGS: There are no findings suspicious for malignancy. IMPRESSION: No mammographic evidence of malignancy. A result letter of this screening mammogram will be mailed directly to the patient. RECOMMENDATION: Screening mammogram in one year. (Code:SM-B-01Y) BI-RADS CATEGORY  1: Negative. Electronically Signed   By: Gabriel Carina.D.  On: 09/30/2022 10:27     Assessment/Plan There are no diagnoses linked to this encounter.   Levora Dredge, MD  10/25/2022 1:31 PM

## 2022-10-26 ENCOUNTER — Ambulatory Visit (INDEPENDENT_AMBULATORY_CARE_PROVIDER_SITE_OTHER): Payer: Medicaid Other | Admitting: Vascular Surgery

## 2022-10-26 ENCOUNTER — Encounter (INDEPENDENT_AMBULATORY_CARE_PROVIDER_SITE_OTHER): Payer: Medicaid Other

## 2022-10-26 DIAGNOSIS — E785 Hyperlipidemia, unspecified: Secondary | ICD-10-CM

## 2022-10-26 DIAGNOSIS — E119 Type 2 diabetes mellitus without complications: Secondary | ICD-10-CM

## 2022-10-26 DIAGNOSIS — I10 Essential (primary) hypertension: Secondary | ICD-10-CM

## 2022-10-26 DIAGNOSIS — I70213 Atherosclerosis of native arteries of extremities with intermittent claudication, bilateral legs: Secondary | ICD-10-CM

## 2022-10-26 DIAGNOSIS — K219 Gastro-esophageal reflux disease without esophagitis: Secondary | ICD-10-CM

## 2022-12-25 IMAGING — CT CT RENAL STONE PROTOCOL
2 of 4 series · 16 of 46 positions shown, 18 images · non-contrast
Comparison: CT abdomen pelvis dated October 06, 2021.

CLINICAL DATA: Left-sided flank pain.



[Series 2: stone full standard · axial · 0.71mm/px · z∈[-1020,-660]mm · 13 of 82 slices shown, 15 images]
[im 5/82  soft-tissue]
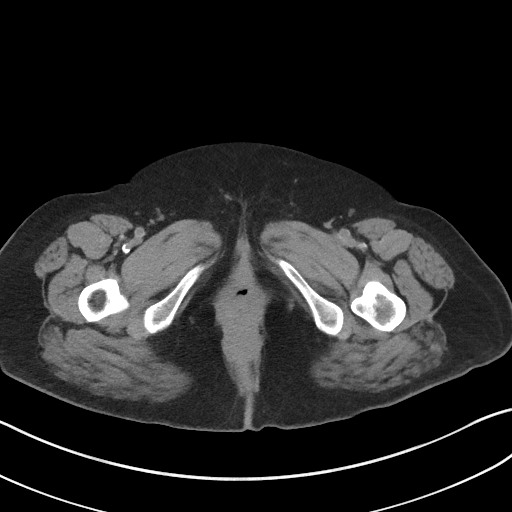
[im 5/82  bone]
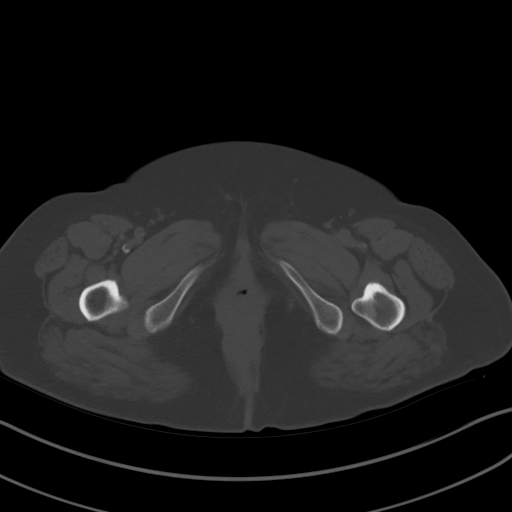
[im 13/82  soft-tissue]
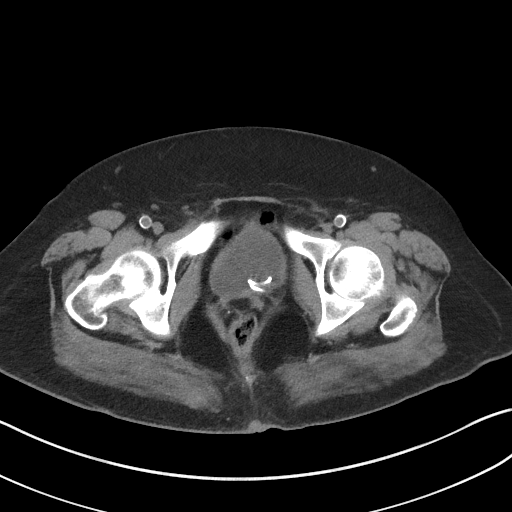
[im 17/82  soft-tissue]
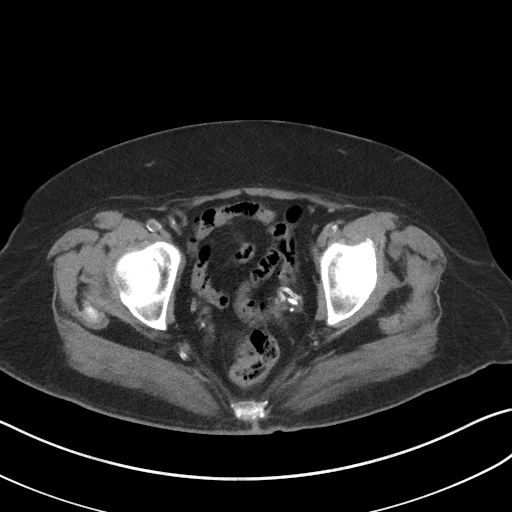
[im 25/82  soft-tissue]
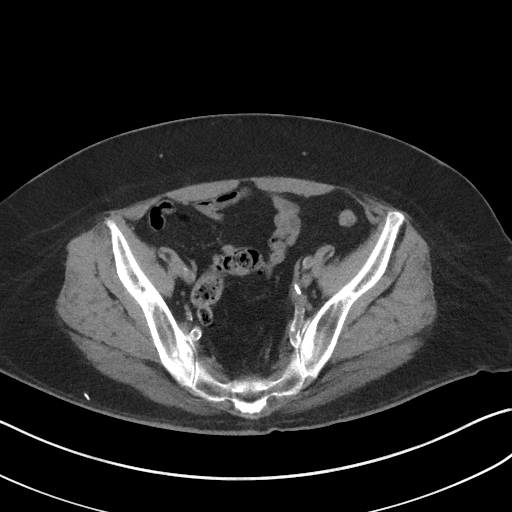
[im 29/82  soft-tissue]
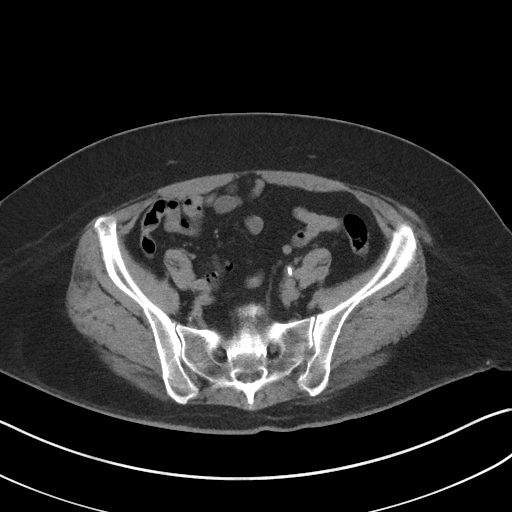
[im 37/82  soft-tissue]
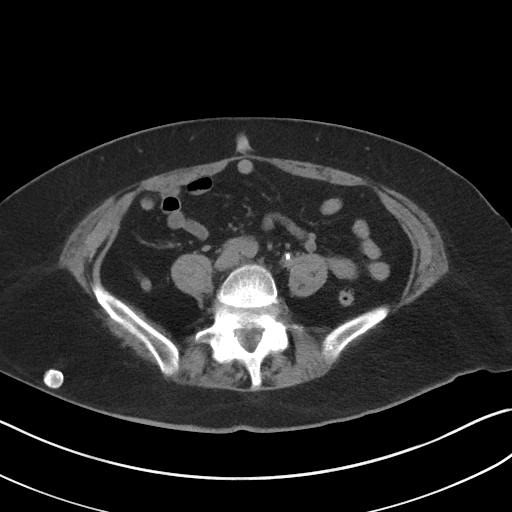
[im 41/82  soft-tissue]
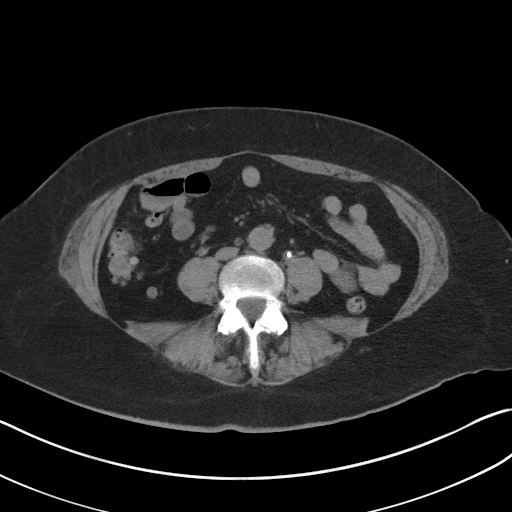
[im 45/82  soft-tissue]
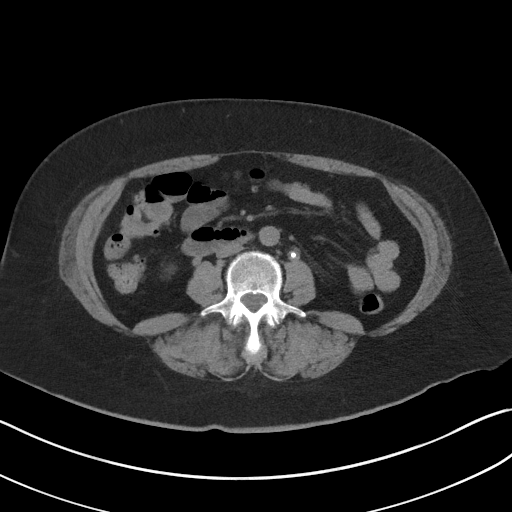
[im 53/82  soft-tissue]
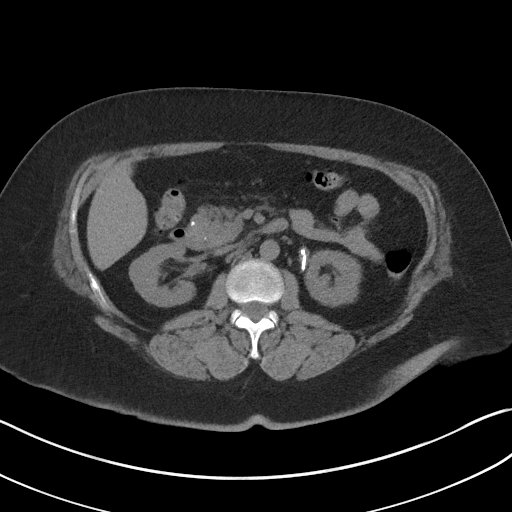
[im 53/82  bone]
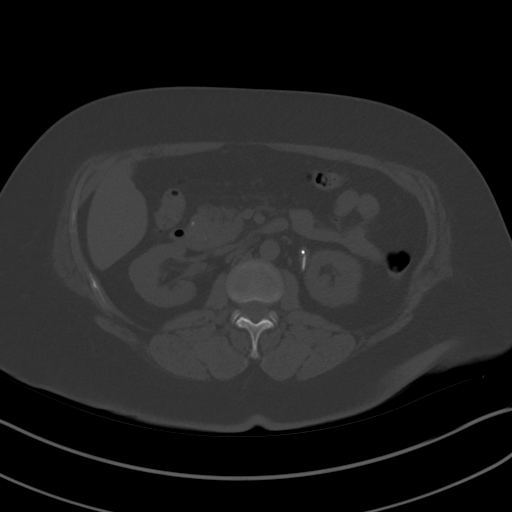
[im 57/82  soft-tissue]
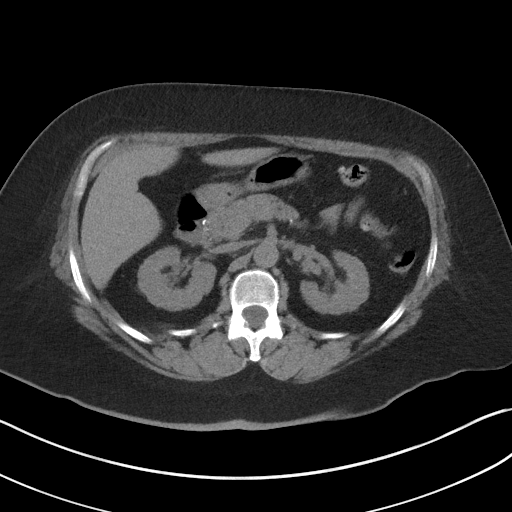
[im 65/82  soft-tissue]
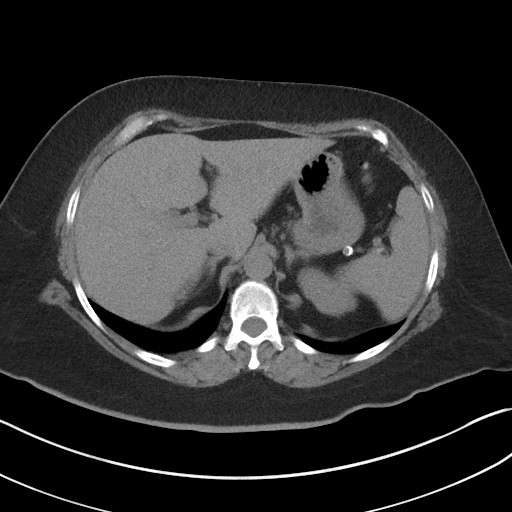
[im 69/82  soft-tissue]
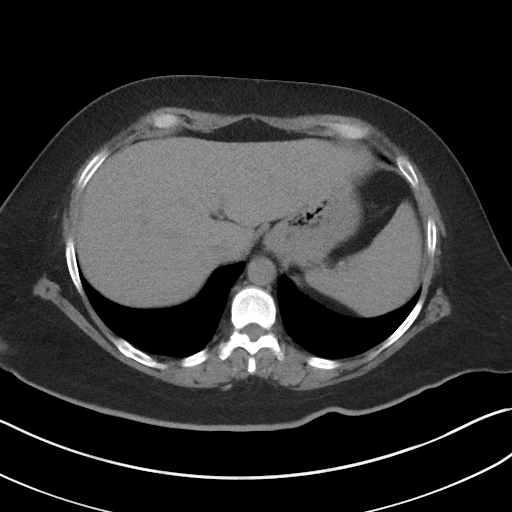
[im 77/82  soft-tissue]
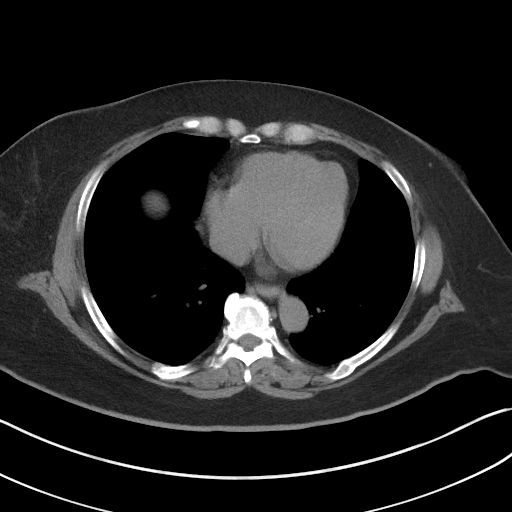

[Series 5: coronal · coronal · 0.83mm/px · 3 of 129 slices shown]
[im 43/129  soft-tissue]
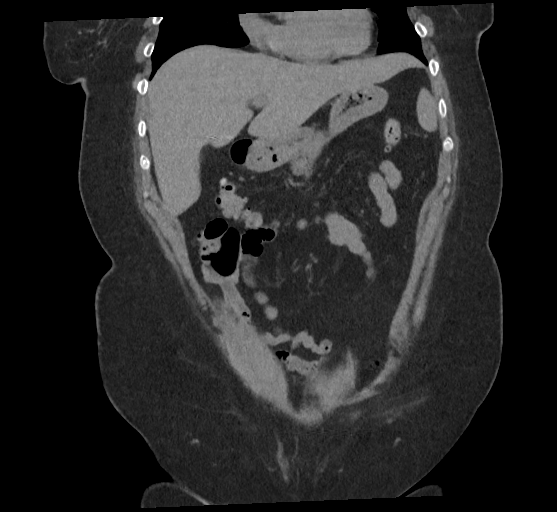
[im 57/129  soft-tissue]
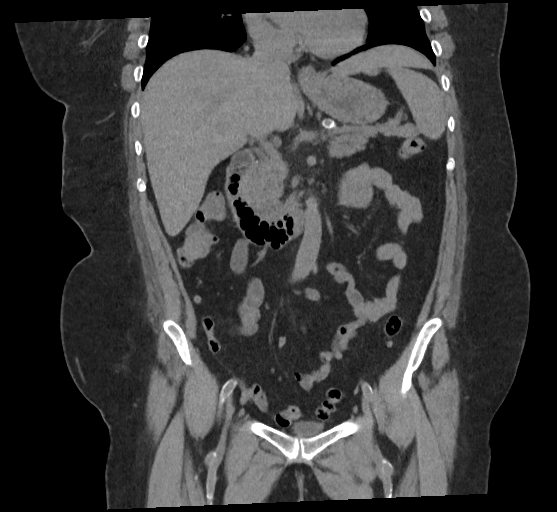
[im 72/129  soft-tissue]
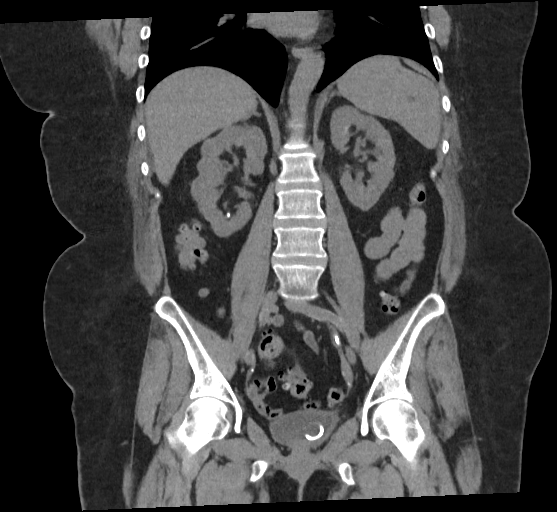

[16 of 46 positions shown; findings below may reference images not displayed]

FINDINGS: Lower chest: No acute abnormality.

Hepatobiliary: No focal liver abnormality is seen. Status post
cholecystectomy. No biliary dilatation.

Pancreas: Unremarkable. No pancreatic ductal dilatation or
surrounding inflammatory changes.

Spleen: Normal in size without focal abnormality.

Adrenals/Urinary Tract: Adrenal glands are unremarkable. New
punctate nonobstructive calculi in the lower pole of the right
kidney and upper pole of the left kidney. Resolved right
hydroureteronephrosis. New left ureteral stent with resolved left
hydroureteronephrosis. Previously seen 6 mm calculus at the left UVJ
is no longer identified. The bladder is unremarkable for the degree
of distention.

Stomach/Bowel: Unchanged small hiatal hernia. Stomach is otherwise
within normal limits. Appendix appears normal. No evidence of bowel
wall thickening, distention, or inflammatory changes. Diffuse
colonic diverticulosis.

Vascular/Lymphatic: No significant vascular findings are present. No
enlarged abdominal or pelvic lymph nodes.

Reproductive: Status post hysterectomy. No adnexal masses.

Other: No abdominal wall hernia or abnormality. No abdominopelvic
ascites. No pneumoperitoneum.

Musculoskeletal: No acute or significant osseous findings. Chronic
bilateral L4 pedicle fractures and grade 1 L4-L5 anterolisthesis
again noted.
IMPRESSION: 1. New left ureteral stent with resolved left hydroureteronephrosis.
Previously seen 6 mm calculus at the left UVJ is no longer
identified.
2. New punctate nonobstructive bilateral nephrolithiasis.

## 2023-02-03 DIAGNOSIS — M7711 Lateral epicondylitis, right elbow: Secondary | ICD-10-CM | POA: Insufficient documentation

## 2023-02-03 DIAGNOSIS — M25511 Pain in right shoulder: Secondary | ICD-10-CM | POA: Insufficient documentation

## 2023-06-18 ENCOUNTER — Other Ambulatory Visit: Payer: Self-pay | Admitting: *Deleted

## 2023-06-18 DIAGNOSIS — N200 Calculus of kidney: Secondary | ICD-10-CM

## 2023-07-02 ENCOUNTER — Ambulatory Visit (INDEPENDENT_AMBULATORY_CARE_PROVIDER_SITE_OTHER): Payer: Medicaid Other | Admitting: Urology

## 2023-07-02 ENCOUNTER — Ambulatory Visit
Admission: RE | Admit: 2023-07-02 | Discharge: 2023-07-02 | Disposition: A | Discharge status: Home / Self Care | Payer: Medicaid Other | Source: Ambulatory Visit | Attending: Urology | Admitting: Urology

## 2023-07-02 ENCOUNTER — Encounter: Payer: Self-pay | Admitting: Urology

## 2023-07-02 VITALS — BP 123/74 | HR 84 | Ht <= 58 in | Wt 150.0 lb

## 2023-07-02 DIAGNOSIS — N200 Calculus of kidney: Secondary | ICD-10-CM

## 2023-07-02 NOTE — Progress Notes (Signed)
 I, Lindsay Dean, acting as a scribe for Glendia JAYSON Barba, MD., have documented all relevant documentation on the behalf of Glendia JAYSON Barba, MD, as directed by Glendia JAYSON Barba, MD while in the presence of Glendia JAYSON Barba, MD.  07/02/2023 11:00 AM   Lindsay Dean April 11, 1966 [id number]  Referring provider: Ernie Yancy Roof, MD [address] [place],  [place] [address]  Chief Complaint  Patient presents with   Nephrolithiasis    HPI: Lindsay Dean is a 58 y.o. female presents for follow-up visit. A [place] Spanish interpreter was present during this visit.   No complaints since her last visit.  No bothersome lower urinary tract symptoms or dysuria.  Denies gross hematuria.  Denies flank, abdominal, or pelvic pain    PMH: Past Medical History:  Diagnosis Date   Diabetes mellitus without complication (HCC)    taken off meds in 2017   Hypertension    Lupus     Surgical History: Past Surgical History:  Procedure Laterality Date   ABDOMINAL HYSTERECTOMY     CYSTOSCOPY W/ RETROGRADES Left 10/07/2021   Procedure: CYSTOSCOPY WITH RETROGRADE PYELOGRAM;  Surgeon: Barba Glendia JAYSON, MD;  Location: [facility];  Service: Urology;  Laterality: Left;   CYSTOSCOPY WITH STENT PLACEMENT Right 05/04/2019   Procedure: CYSTOSCOPY with attempt of stent placement;  Surgeon: Francisca Redell JAYSON, MD;  Location: [facility];  Service: Urology;  Laterality: Right;   CYSTOSCOPY/URETEROSCOPY/HOLMIUM LASER/STENT PLACEMENT Left 10/07/2021   Procedure: CYSTOSCOPY/URETEROSCOPY/HOLMIUM LASER/STENT PLACEMENT;  Surgeon: Barba Glendia JAYSON, MD;  Location: [facility];  Service: Urology;  Laterality: Left;   IR NEPHRO TUBE REMOV/FL  12/29/2021   IR NEPHROSTOMY PLACEMENT RIGHT  12/18/2021    Home Medications:  Allergies as of 07/02/2023       Reactions   Tramadol  Nausea Only   Other reaction(s): Dizziness        Medication List        Accurate as of July 02, 2023 11:00 AM. If you  have any questions, ask your nurse or doctor.          atenolol  50 MG tablet Commonly known as: TENORMIN  Take 50 mg by mouth daily.   atorvastatin  20 MG tablet Commonly known as: LIPITOR Take 20 mg by mouth daily.   cephALEXin  500 MG capsule Commonly known as: KEFLEX  Take 1 capsule (500 mg total) by mouth 2 (two) times daily.   cetirizine  10 MG tablet Commonly known as: ZYRTEC  Take 1 tablet (10 mg total) by mouth daily.   fluticasone  50 MCG/ACT nasal spray Commonly known as: FLONASE  Place 1 spray into both nostrils daily.   hydrochlorothiazide 25 MG tablet Commonly known as: HYDRODIURIL Take 25 mg by mouth daily.   losartan  100 MG tablet Commonly known as: COZAAR  Take 100 mg by mouth daily.   omeprazole 20 MG capsule Commonly known as: PRILOSEC Take 20 mg by mouth daily.   ondansetron  4 MG disintegrating tablet Commonly known as: ZOFRAN -ODT Take 1 tablet (4 mg total) by mouth every 8 (eight) hours as needed for nausea or vomiting.   ondansetron  4 MG tablet Commonly known as: ZOFRAN  Take 4 mg by mouth every 8 (eight) hours as needed.   promethazine -dextromethorphan 6.25-15 MG/5ML syrup Commonly known as: PROMETHAZINE -DM Take 5 mLs by mouth 4 (four) times daily as needed for cough.        Allergies:  Allergies  Allergen Reactions   Tramadol  Nausea Only    Other reaction(s): Dizziness  Family History: Family History  Problem Relation Age of Onset   Diabetes Mother    Heart disease Mother    Hypertension Mother    Arthritis Mother    Hypertension Father    Lung disease Father    Breast cancer Maternal Uncle     Social History:  reports that she has never smoked. She has never used smokeless tobacco. She reports that she does not drink alcohol and does not use drugs.   Physical Exam: BP 123/74   Pulse 84   Ht 4' 9 (1.448 m)   Wt 150 lb (68 kg)   BMI 32.46 kg/m   Constitutional:  Alert and oriented, No acute distress. HEENT: [place]  AT Respiratory: Normal respiratory effort, no increased work of breathing. Psychiatric: Normal mood and affect.   Pertinent Imaging: KUB performed earlier today was personally interpreted. Moderate amount of stool and bowel gas overlying the renal outlines. No calcifications suspicious for urinary tract stones are identified.   Assessment & Plan:    1. Bilateral nephrolithiasis Punctate bilateral renal calculi on prior CT March 2024- asymptomatic.  1 year follow-up with KUB Instructed to call earlier for flank pain, renal colic, or lower urinary tract symptoms.   I have reviewed the above documentation for accuracy and completeness, and I agree with the above.   Glendia JAYSON Barba, MD  [facility] [address], [address] [place], [place] [address] [phone]

## 2024-03-01 ENCOUNTER — Ambulatory Visit
Admission: EM | Admit: 2024-03-01 | Discharge: 2024-03-01 | Disposition: A | Discharge status: Home / Self Care | Attending: Emergency Medicine | Admitting: Emergency Medicine

## 2024-03-01 DIAGNOSIS — E119 Type 2 diabetes mellitus without complications: Secondary | ICD-10-CM | POA: Diagnosis not present

## 2024-03-01 DIAGNOSIS — Z1152 Encounter for screening for COVID-19: Secondary | ICD-10-CM | POA: Insufficient documentation

## 2024-03-01 DIAGNOSIS — J069 Acute upper respiratory infection, unspecified: Secondary | ICD-10-CM | POA: Diagnosis not present

## 2024-03-01 DIAGNOSIS — R42 Dizziness and giddiness: Secondary | ICD-10-CM | POA: Insufficient documentation

## 2024-03-01 DIAGNOSIS — R059 Cough, unspecified: Secondary | ICD-10-CM | POA: Insufficient documentation

## 2024-03-01 DIAGNOSIS — M3214 Glomerular disease in systemic lupus erythematosus: Secondary | ICD-10-CM | POA: Insufficient documentation

## 2024-03-01 DIAGNOSIS — I129 Hypertensive chronic kidney disease with stage 1 through stage 4 chronic kidney disease, or unspecified chronic kidney disease: Secondary | ICD-10-CM | POA: Insufficient documentation

## 2024-03-01 DIAGNOSIS — E1122 Type 2 diabetes mellitus with diabetic chronic kidney disease: Secondary | ICD-10-CM | POA: Diagnosis not present

## 2024-03-01 DIAGNOSIS — B9789 Other viral agents as the cause of diseases classified elsewhere: Secondary | ICD-10-CM | POA: Insufficient documentation

## 2024-03-01 DIAGNOSIS — N189 Chronic kidney disease, unspecified: Secondary | ICD-10-CM | POA: Diagnosis not present

## 2024-03-01 LAB — GLUCOSE, CAPILLARY: Glucose-Capillary: 102 mg/dL — ABNORMAL HIGH (ref 70–99)

## 2024-03-01 LAB — SARS CORONAVIRUS 2 BY RT PCR: SARS Coronavirus 2 by RT PCR: NEGATIVE

## 2024-03-01 MED ORDER — CETIRIZINE HCL 10 MG PO TABS: 10.0000 mg | ORAL_TABLET | Freq: Every day | ORAL | 0 refills | Status: AC

## 2024-03-01 MED ORDER — IPRATROPIUM BROMIDE 0.06 % NA SOLN: 2.0000 | Freq: Four times a day (QID) | NASAL | 12 refills | Status: AC

## 2024-03-01 MED ORDER — PROMETHAZINE-DM 6.25-15 MG/5ML PO SYRP: 5.0000 mL | ORAL_SOLUTION | Freq: Four times a day (QID) | ORAL | 0 refills | Status: AC | PRN

## 2024-03-01 MED ORDER — BENZONATATE 100 MG PO CAPS: 200.0000 mg | ORAL_CAPSULE | Freq: Three times a day (TID) | ORAL | 0 refills | Status: AC

## 2024-03-01 NOTE — ED Provider Notes (Signed)
 [facility]    CSN: [id number] Arrival date & time: 03/01/24  0810      History   Chief Complaint Chief Complaint  Patient presents with   Dizziness   Headache    HPI Lindsay Dean is a 58 y.o. female.   HPI  58 year old female with past medical history significant for diabetes, currently off medications, hypertension, lupus presents for evaluation of frontal headache and intermittent dizziness that started 2 days ago.  She describes the headache as mild and rates it as a 3/10.  She has taken Tylenol  without any significant improvement.  The dizziness comes and goes and mainly occurs with change of position.  She does endorse runny nose, nasal congestion, and a mild nonproductive cough.  No ear pain, sore throat, or known sick contacts.  Spanish interpreter Hipolito [phone] used for history and physical.  Past Medical History:  Diagnosis Date   Diabetes mellitus without complication (HCC)    taken off meds in 2017   Hypertension    Lupus     Patient Active Problem List   Diagnosis Date Noted   Right shoulder pain 02/03/2023   Right lateral epicondylitis 02/03/2023   Atherosclerosis of native arteries of extremity with intermittent claudication (HCC) 10/25/2022   Sicca (HCC) 01/29/2022   Acute unilateral obstructive uropathy 12/18/2021   Sepsis due to gram-negative UTI (HCC) 12/18/2021   Acute kidney injury superimposed on chronic kidney disease (HCC) 12/18/2021   Essential hypertension 12/18/2021   Dyslipidemia 12/18/2021   GERD without esophagitis 12/18/2021   Type 2 diabetes mellitus without complications (HCC) 12/18/2021   Pyelonephritis 11/05/2021   Sepsis secondary to UTI (HCC) 11/04/2021   UTI (urinary tract infection) 11/04/2021   Hyponatremia 11/04/2021   AKI (acute kidney injury) (HCC) 11/04/2021   Ureterolithiasis 10/06/2021   Diabetes mellitus without complication (HCC)    Hypertension 10/28/2020   Obesity 10/28/2020   PAD  (peripheral artery disease) (HCC) 10/28/2020   Sepsis (HCC) 05/04/2019   Cystocele with rectocele 05/27/2018   Prolapsed, uterovaginal, incomplete 05/27/2018   Sleep apnea 05/27/2018   Spondylolisthesis 05/27/2018   SUI (stress urinary incontinence, female) 05/27/2018   Back pain 03/05/2014   Systemic lupus erythematosus (HCC) 03/05/2014   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    Past Surgical History:  Procedure Laterality Date   ABDOMINAL HYSTERECTOMY     CYSTOSCOPY W/ RETROGRADES Left 10/07/2021   Procedure: CYSTOSCOPY WITH RETROGRADE PYELOGRAM;  Surgeon: Twylla Glendia BROCKS, MD;  Location: [facility];  Service: Urology;  Laterality: Left;   CYSTOSCOPY WITH STENT PLACEMENT Right 05/04/2019   Procedure: CYSTOSCOPY with attempt of stent placement;  Surgeon: Francisca Redell BROCKS, MD;  Location: [facility];  Service: Urology;  Laterality: Right;   CYSTOSCOPY/URETEROSCOPY/HOLMIUM LASER/STENT PLACEMENT Left 10/07/2021   Procedure: CYSTOSCOPY/URETEROSCOPY/HOLMIUM LASER/STENT PLACEMENT;  Surgeon: Twylla Glendia BROCKS, MD;  Location: [facility];  Service: Urology;  Laterality: Left;   IR NEPHRO TUBE REMOV/FL  12/29/2021   IR NEPHROSTOMY PLACEMENT RIGHT  12/18/2021    OB History   No obstetric history on file.      Home Medications    Prior to Admission medications   Medication Sig Start Date End Date Taking? Authorizing Provider  benzonatate  (TESSALON ) 100 MG capsule Take 2 capsules (200 mg total) by mouth every 8 (eight) hours. 03/01/24  Yes Bernardino Ditch, NP  celecoxib (CELEBREX) 200 MG capsule Take 1 capsule (200 mg total) by mouth daily as needed for pain. 08/25/23 08/24/24 Yes [provider]  hydroxychloroquine  (PLAQUENIL ) 200  MG tablet Take 200 mg by mouth daily. 08/25/23 08/24/24 Yes [provider]  ipratropium (ATROVENT ) 0.06 % nasal spray Place 2 sprays into both nostrils 4 (four) times daily. 03/01/24  Yes Bernardino Ditch, NP  promethazine -dextromethorphan (PROMETHAZINE -DM) 6.25-15 MG/5ML  syrup Take 5 mLs by mouth 4 (four) times daily as needed. 03/01/24  Yes Bernardino Ditch, NP  atenolol  (TENORMIN ) 50 MG tablet Take 50 mg by mouth daily.    [provider]  atorvastatin  (LIPITOR) 20 MG tablet Take 20 mg by mouth daily.    [provider]  cephALEXin  (KEFLEX ) 500 MG capsule Take 1 capsule (500 mg total) by mouth 2 (two) times daily. 08/23/22   Viviann Pastor, MD  cetirizine  (ZYRTEC ) 10 MG tablet Take 1 tablet (10 mg total) by mouth daily. 03/01/24   Bernardino Ditch, NP  hydrochlorothiazide (HYDRODIURIL) 25 MG tablet Take 25 mg by mouth daily. 08/27/21   [provider]  losartan  (COZAAR ) 100 MG tablet Take 100 mg by mouth daily. 07/25/21   [provider]  omeprazole (PRILOSEC) 20 MG capsule Take 20 mg by mouth daily. 07/25/21   [provider]  ondansetron  (ZOFRAN ) 4 MG tablet Take 4 mg by mouth every 8 (eight) hours as needed. 07/21/22   [provider]  ondansetron  (ZOFRAN -ODT) 4 MG disintegrating tablet Take 1 tablet (4 mg total) by mouth every 8 (eight) hours as needed for nausea or vomiting. 09/04/22   Menshew, Candida LULLA Kings, PA-C    Family History Family History  Problem Relation Age of Onset   Diabetes Mother    Heart disease Mother    Hypertension Mother    Arthritis Mother    Hypertension Father    Lung disease Father    Breast cancer Maternal Uncle     Social History Social History   Tobacco Use   Smoking status: Never   Smokeless tobacco: Never  Vaping Use   Vaping status: Never Used  Substance Use Topics   Alcohol use: No   Drug use: No     Allergies   Tramadol    Review of Systems Review of Systems  Constitutional:  Negative for fever.  HENT:  Positive for congestion and rhinorrhea. Negative for ear pain and sore throat.   Respiratory:  Positive for cough. Negative for shortness of breath and wheezing.   Neurological:  Positive for dizziness and headaches.     Physical Exam Triage Vital Signs ED  Triage Vitals  Encounter Vitals Group     BP --      Girls Systolic BP Percentile --      Girls Diastolic BP Percentile --      Boys Systolic BP Percentile --      Boys Diastolic BP Percentile --      Pulse --      Resp 03/01/24 0819 16     Temp --      Temp Source 03/01/24 0819 Oral     SpO2 --      Weight 03/01/24 0817 153 lb (69.4 kg)     Height 03/01/24 0817 4' 9 (1.448 m)     Head Circumference --      Peak Flow --      Pain Score --      Pain Loc --      Pain Education --      Exclude from Growth Chart --    No data found.  Updated Vital Signs BP (!) 159/96 (BP Location: Right Arm)  Pulse (!) 57   Temp 98 F (36.7 C) (Oral)   Resp 16   Ht 4' 9 (1.448 m)   Wt 153 lb (69.4 kg)   SpO2 97%   BMI 33.11 kg/m   Visual Acuity Right Eye Distance:   Left Eye Distance:   Bilateral Distance:    Right Eye Near:   Left Eye Near:    Bilateral Near:     Physical Exam Vitals and nursing note reviewed.  Constitutional:      Appearance: Normal appearance. She is not ill-appearing.  HENT:     Head: Normocephalic and atraumatic.     Right Ear: Ear canal and external ear normal. There is no impacted cerumen.     Left Ear: Ear canal and external ear normal. There is no impacted cerumen.     Ears:     Comments: Mild erythema to the rim of both tympanic membranes with visible serous effusion present on the right.    Nose: Congestion present. No rhinorrhea.     Comments: Please mucosa is edematous and erythematous without appreciable discharge.    Mouth/Throat:     Mouth: Mucous membranes are moist.     Pharynx: Oropharynx is clear. No oropharyngeal exudate or posterior oropharyngeal erythema.  Cardiovascular:     Rate and Rhythm: Normal rate and regular rhythm.     Pulses: Normal pulses.     Heart sounds: Normal heart sounds. No murmur heard.    No friction rub. No gallop.  Pulmonary:     Effort: Pulmonary effort is normal.     Breath sounds: Normal breath sounds. No  wheezing, rhonchi or rales.  Musculoskeletal:     Cervical back: Normal range of motion and neck supple. No tenderness.  Lymphadenopathy:     Cervical: No cervical adenopathy.  Skin:    General: Skin is warm and dry.     Capillary Refill: Capillary refill takes less than 2 seconds.     Findings: No erythema.  Neurological:     General: No focal deficit present.     Mental Status: She is alert and oriented to person, place, and time.      UC Treatments / Results  Labs (all labs ordered are listed, but only abnormal results are displayed) Labs Reviewed  GLUCOSE, CAPILLARY - Abnormal; Notable for the following components:      Result Value   Glucose-Capillary 102 (*)    All other components within normal limits  SARS CORONAVIRUS 2 BY RT PCR  CBG MONITORING, ED    EKG Sinus bradycardia with a ventricular to 55 bpm PR interval 132 ms QRS duration 68 ms QT/QTc 480/459 ms   Radiology No results found.  Procedures Procedures (including critical care time)  Medications Ordered in UC Medications - No data to display  Initial Impression / Assessment and Plan / UC Course  I have reviewed the triage vital signs and the nursing notes.  Pertinent labs & imaging results that were available during my care of the patient were reviewed by me and considered in my medical decision making (see chart for details).   Patient is a pleasant, nontoxic-appearing 58 year old female who is Spanish-speaking only presenting for evaluation of 2 days worth of mild frontal headache and intermittent dizziness as outlined in HPI above.  She is experiencing both upper and lower respiratory symptoms.  She denies any known sick contacts or recent travel.  She is diabetic but has been off medication since 2017.  Fingerstick blood sugar  checked at triage and was 102.  I have also ordered an EKG given that the patient has a history of hypertension as well as lupus.  EKG shows sinus bradycardia.  Reading  voltage criteria for left ventricular hypertrophy.  I compared the EKG to previous in our system from 10/06/2021 and there is no appreciable change.  The patient's dizziness mainly occurs with change in position and I suspect is secondary to the fluid in her middle ear.  Her exam does reveal inflammation of her upper respiratory tract.  Given that she has had symptoms for 2 days I will not test her for influenza but I will test her for COVID.  The differential diagnosis include viral respiratory illness.  COVID PCR is still pending.  I inquired with the lab as to why the results are not back.  The cartridge flagged invalid but did not let the lab staff know.  They are rerunning the sample.  COVID PCR is negative.  I will discharge patient home with a diagnosis of viral URI with a cough.  I will prescribe Atrovent  nasal spray to help with the nasal congestion.  Tessalon  Perles and Promethazine  DM cough syrup for cough and congestion.   Final Clinical Impressions(s) / UC Diagnoses   Final diagnoses:  Diabetes mellitus without complication (HCC)  Viral URI with cough  Dizziness     Discharge Instructions      Su prueba de hoy dio negativo para COVID. Su examen es compatible con una infeccin de las vas respiratorias superiores. Tambin pude observar lquido detrs del tmpano derecho, lo que podra estar causando mareos al cambiar de posicin.  Tome Zyrtec  una vez al da para ayudar a Optometrist lquido en el odo [place], lo que debera [employer].  Use el aerosol nasal Atrovent , 2 dosis en cada fosa nasal cada 6 horas, segn sea necesario para la rinorrea y Tax adviser.  Use Tessalon  Perles cada 8 horas durante el da. Tmelos con un pequeo sorbo de agua. Es posible que le produzcan algo de entumecimiento en la base de la lengua o un sabor metlico en la boca; esto es normal.  Use el jarabe para la tos Prometazina DM antes de acostarse para la tos y Sales executive. Le  causar somnolencia, as que no lo tome Administrator.  Regrese para una reevaluacin o consulte a su mdico de cabecera si presenta sntomas nuevos o que empeoran.  Your testing today was negative for COVID.  Your exam is consistent with an upper respiratory tract infection.  I was also able to see some fluid behind your right eardrum which could be causing your dizziness with change of position.  Take the Zyrtec  once daily to help resolve the fluid in your middle ear and this should help with your dizziness.  Use the Atrovent  nasal spray, 2 squirts in each nostril every 6 hours, as needed for runny nose and postnasal drip.  Use the Tessalon  Perles every 8 hours during the day.  Take them with a small sip of water.  They may give you some numbness to the base of your tongue or a metallic taste in your mouth, this is normal.  Use the Promethazine  DM cough syrup at bedtime for cough and congestion.  It will make you drowsy so do not take it during the day.  Return for reevaluation or see your primary care provider for any new or worsening symptoms.      ED Prescriptions  Medication Sig Dispense Auth. Provider   benzonatate  (TESSALON ) 100 MG capsule Take 2 capsules (200 mg total) by mouth every 8 (eight) hours. 21 capsule Bernardino Ditch, NP   ipratropium (ATROVENT ) 0.06 % nasal spray Place 2 sprays into both nostrils 4 (four) times daily. 15 mL Bernardino Ditch, NP   promethazine -dextromethorphan (PROMETHAZINE -DM) 6.25-15 MG/5ML syrup Take 5 mLs by mouth 4 (four) times daily as needed. 118 mL Bernardino Ditch, NP   cetirizine  (ZYRTEC ) 10 MG tablet Take 1 tablet (10 mg total) by mouth daily. 30 tablet Bernardino Ditch, NP      PDMP not reviewed this encounter.   Bernardino Ditch, NP 03/01/24 1100

## 2024-03-01 NOTE — ED Triage Notes (Signed)
 Pt c/o HA & dizziness x2 days. States dizziness comes & goes. Denies any nausea or emesis. No OTC meds. Hx of lupus.

## 2024-03-01 NOTE — Discharge Instructions (Addendum)
 Su prueba de hoy dio negativo para COVID. Su examen es compatible con una infeccin de las vas respiratorias superiores. Tambin pude observar lquido detrs del tmpano derecho, lo que podra estar causando mareos al cambiar de posicin.  Tome Zyrtec  una vez al da para ayudar a Optometrist lquido en el odo [place], lo que debera [employer].  Use el aerosol nasal Atrovent , 2 dosis en cada fosa nasal cada 6 horas, segn sea necesario para la rinorrea y Tax adviser.  Use Tessalon  Perles cada 8 horas durante el da. Tmelos con un pequeo sorbo de agua. Es posible que le produzcan algo de entumecimiento en la base de la lengua o un sabor metlico en la boca; esto es normal.  Use el jarabe para la tos Prometazina DM antes de acostarse para la tos y Sales executive. Le causar somnolencia, as que no lo tome Administrator.  Regrese para una reevaluacin o consulte a su mdico de cabecera si presenta sntomas nuevos o que empeoran.  Your testing today was negative for COVID.  Your exam is consistent with an upper respiratory tract infection.  I was also able to see some fluid behind your right eardrum which could be causing your dizziness with change of position.  Take the Zyrtec  once daily to help resolve the fluid in your middle ear and this should help with your dizziness.  Use the Atrovent  nasal spray, 2 squirts in each nostril every 6 hours, as needed for runny nose and postnasal drip.  Use the Tessalon  Perles every 8 hours during the day.  Take them with a small sip of water.  They may give you some numbness to the base of your tongue or a metallic taste in your mouth, this is normal.  Use the Promethazine  DM cough syrup at bedtime for cough and congestion.  It will make you drowsy so do not take it during the day.  Return for reevaluation or see your primary care provider for any new or worsening symptoms.

## 2024-05-10 ENCOUNTER — Other Ambulatory Visit: Payer: Self-pay | Admitting: Nurse Practitioner

## 2024-05-10 DIAGNOSIS — Z1231 Encounter for screening mammogram for malignant neoplasm of breast: Secondary | ICD-10-CM

## 2024-06-30 ENCOUNTER — Ambulatory Visit: Payer: Self-pay | Admitting: Urology

## 2024-07-10 ENCOUNTER — Other Ambulatory Visit: Payer: Self-pay

## 2024-07-10 DIAGNOSIS — N200 Calculus of kidney: Secondary | ICD-10-CM

## 2024-07-11 ENCOUNTER — Ambulatory Visit: Admitting: Urology

## 2024-07-11 ENCOUNTER — Encounter: Payer: Self-pay | Admitting: Urology
